# Patient Record
Sex: Female | Born: 1940 | Race: Black or African American | Hispanic: No | Smoking: Former smoker
Health system: Southern US, Community
[De-identification: ages and names within clinical notes are randomized; demographics above are authoritative.]

## PROBLEM LIST (undated history)

## (undated) DIAGNOSIS — R102 Pelvic and perineal pain: Secondary | ICD-10-CM

## (undated) DIAGNOSIS — Z22322 Carrier or suspected carrier of Methicillin resistant Staphylococcus aureus: Secondary | ICD-10-CM

## (undated) DIAGNOSIS — E669 Obesity, unspecified: Secondary | ICD-10-CM

## (undated) DIAGNOSIS — E119 Type 2 diabetes mellitus without complications: Secondary | ICD-10-CM

## (undated) DIAGNOSIS — D279 Benign neoplasm of unspecified ovary: Secondary | ICD-10-CM

## (undated) DIAGNOSIS — M1990 Unspecified osteoarthritis, unspecified site: Secondary | ICD-10-CM

## (undated) DIAGNOSIS — H269 Unspecified cataract: Secondary | ICD-10-CM

## (undated) DIAGNOSIS — I252 Old myocardial infarction: Secondary | ICD-10-CM

## (undated) DIAGNOSIS — R011 Cardiac murmur, unspecified: Secondary | ICD-10-CM

## (undated) DIAGNOSIS — H40009 Preglaucoma, unspecified, unspecified eye: Secondary | ICD-10-CM

## (undated) DIAGNOSIS — K219 Gastro-esophageal reflux disease without esophagitis: Secondary | ICD-10-CM

## (undated) DIAGNOSIS — G4730 Sleep apnea, unspecified: Secondary | ICD-10-CM

## (undated) DIAGNOSIS — I499 Cardiac arrhythmia, unspecified: Secondary | ICD-10-CM

## (undated) DIAGNOSIS — D229 Melanocytic nevi, unspecified: Secondary | ICD-10-CM

## (undated) DIAGNOSIS — I10 Essential (primary) hypertension: Secondary | ICD-10-CM

## (undated) HISTORY — DX: Old myocardial infarction: I25.2

## (undated) HISTORY — DX: Essential (primary) hypertension: I10

## (undated) HISTORY — DX: Unspecified osteoarthritis, unspecified site: M19.90

## (undated) HISTORY — DX: Cardiac arrhythmia, unspecified: I49.9

## (undated) HISTORY — DX: Unspecified cataract: H26.9

## (undated) HISTORY — DX: Cardiac murmur, unspecified: R01.1

## (undated) HISTORY — DX: Benign neoplasm of unspecified ovary: D27.9

## (undated) HISTORY — PX: DILATION AND CURETTAGE OF UTERUS: SHX78

## (undated) HISTORY — DX: Carrier or suspected carrier of methicillin resistant Staphylococcus aureus: Z22.322

## (undated) HISTORY — DX: Obesity, unspecified: E66.9

## (undated) HISTORY — DX: Sleep apnea, unspecified: G47.30

## (undated) HISTORY — DX: Pelvic and perineal pain: R10.2

## (undated) HISTORY — PX: BREAST BIOPSY: SHX20

## (undated) HISTORY — DX: Type 2 diabetes mellitus without complications: E11.9

## (undated) HISTORY — PX: OOPHORECTOMY: SHX86

## (undated) HISTORY — DX: Gastro-esophageal reflux disease without esophagitis: K21.9

## (undated) HISTORY — PX: LAPAROSCOPY: SHX197

## (undated) HISTORY — PX: KNEE ARTHROSCOPY: SUR90

## (undated) HISTORY — DX: Preglaucoma, unspecified, unspecified eye: H40.009

## (undated) HISTORY — DX: Melanocytic nevi, unspecified: D22.9

---

## 1980-06-30 HISTORY — PX: VAGINAL HYSTERECTOMY: SUR661

## 2000-10-16 ENCOUNTER — Encounter: Admission: RE | Admit: 2000-10-16 | Discharge: 2000-10-16 | Payer: Self-pay

## 2001-01-14 ENCOUNTER — Encounter: Admission: RE | Admit: 2001-01-14 | Discharge: 2001-01-14 | Payer: Self-pay | Admitting: Internal Medicine

## 2001-03-11 ENCOUNTER — Encounter: Admission: RE | Admit: 2001-03-11 | Discharge: 2001-03-11 | Payer: Self-pay | Admitting: Internal Medicine

## 2001-05-10 ENCOUNTER — Encounter: Admission: RE | Admit: 2001-05-10 | Discharge: 2001-05-10 | Payer: Self-pay | Admitting: Internal Medicine

## 2001-05-26 ENCOUNTER — Encounter: Admission: RE | Admit: 2001-05-26 | Discharge: 2001-05-26 | Payer: Self-pay

## 2001-08-09 ENCOUNTER — Encounter: Admission: RE | Admit: 2001-08-09 | Discharge: 2001-08-09 | Payer: Self-pay | Admitting: Internal Medicine

## 2002-03-02 ENCOUNTER — Encounter: Admission: RE | Admit: 2002-03-02 | Discharge: 2002-05-31 | Payer: Self-pay | Admitting: Unknown Physician Specialty

## 2002-03-31 ENCOUNTER — Encounter: Payer: Self-pay | Admitting: Family Medicine

## 2002-03-31 ENCOUNTER — Encounter: Admission: RE | Admit: 2002-03-31 | Discharge: 2002-03-31 | Payer: Self-pay | Admitting: Family Medicine

## 2003-03-24 ENCOUNTER — Encounter: Admission: RE | Admit: 2003-03-24 | Discharge: 2003-03-24 | Payer: Self-pay | Admitting: Internal Medicine

## 2003-05-01 ENCOUNTER — Encounter: Admission: RE | Admit: 2003-05-01 | Discharge: 2003-05-01 | Payer: Self-pay | Admitting: Internal Medicine

## 2003-08-01 ENCOUNTER — Encounter: Admission: RE | Admit: 2003-08-01 | Discharge: 2003-08-01 | Payer: Self-pay | Admitting: Internal Medicine

## 2003-08-24 ENCOUNTER — Encounter: Admission: RE | Admit: 2003-08-24 | Discharge: 2003-08-24 | Payer: Self-pay | Admitting: Internal Medicine

## 2003-08-25 ENCOUNTER — Encounter: Admission: RE | Admit: 2003-08-25 | Discharge: 2003-08-25 | Payer: Self-pay | Admitting: Internal Medicine

## 2003-09-11 ENCOUNTER — Ambulatory Visit (HOSPITAL_COMMUNITY): Admission: RE | Admit: 2003-09-11 | Discharge: 2003-09-11 | Payer: Self-pay | Admitting: Internal Medicine

## 2003-09-11 ENCOUNTER — Encounter: Admission: RE | Admit: 2003-09-11 | Discharge: 2003-09-11 | Payer: Self-pay | Admitting: Internal Medicine

## 2003-11-07 ENCOUNTER — Encounter: Admission: RE | Admit: 2003-11-07 | Discharge: 2003-11-07 | Payer: Self-pay | Admitting: Internal Medicine

## 2003-11-12 ENCOUNTER — Emergency Department (HOSPITAL_COMMUNITY): Admission: EM | Admit: 2003-11-12 | Discharge: 2003-11-12 | Payer: Self-pay | Admitting: Family Medicine

## 2004-01-26 ENCOUNTER — Emergency Department (HOSPITAL_COMMUNITY): Admission: EM | Admit: 2004-01-26 | Discharge: 2004-01-26 | Payer: Self-pay | Admitting: Family Medicine

## 2004-04-17 ENCOUNTER — Ambulatory Visit: Payer: Self-pay | Admitting: Internal Medicine

## 2004-04-24 ENCOUNTER — Ambulatory Visit: Payer: Self-pay | Admitting: Internal Medicine

## 2004-04-29 ENCOUNTER — Emergency Department (HOSPITAL_COMMUNITY): Admission: EM | Admit: 2004-04-29 | Discharge: 2004-04-29 | Payer: Self-pay | Admitting: Family Medicine

## 2004-05-12 ENCOUNTER — Emergency Department (HOSPITAL_COMMUNITY): Admission: EM | Admit: 2004-05-12 | Discharge: 2004-05-12 | Payer: Self-pay | Admitting: Family Medicine

## 2004-05-15 ENCOUNTER — Emergency Department (HOSPITAL_COMMUNITY): Admission: EM | Admit: 2004-05-15 | Discharge: 2004-05-15 | Payer: Self-pay | Admitting: Family Medicine

## 2004-05-17 ENCOUNTER — Emergency Department (HOSPITAL_COMMUNITY): Admission: EM | Admit: 2004-05-17 | Discharge: 2004-05-17 | Payer: Self-pay | Admitting: Family Medicine

## 2004-06-20 ENCOUNTER — Ambulatory Visit: Payer: Self-pay | Admitting: Internal Medicine

## 2004-07-02 ENCOUNTER — Ambulatory Visit: Payer: Self-pay | Admitting: Internal Medicine

## 2004-10-08 ENCOUNTER — Emergency Department (HOSPITAL_COMMUNITY): Admission: EM | Admit: 2004-10-08 | Discharge: 2004-10-08 | Payer: Self-pay | Admitting: Family Medicine

## 2004-10-15 ENCOUNTER — Emergency Department (HOSPITAL_COMMUNITY): Admission: EM | Admit: 2004-10-15 | Discharge: 2004-10-15 | Payer: Self-pay | Admitting: Family Medicine

## 2004-11-04 ENCOUNTER — Ambulatory Visit: Payer: Self-pay | Admitting: Internal Medicine

## 2004-11-12 ENCOUNTER — Ambulatory Visit (HOSPITAL_COMMUNITY): Admission: RE | Admit: 2004-11-12 | Discharge: 2004-11-12 | Payer: Self-pay | Admitting: Internal Medicine

## 2004-12-15 ENCOUNTER — Ambulatory Visit (HOSPITAL_BASED_OUTPATIENT_CLINIC_OR_DEPARTMENT_OTHER): Admission: RE | Admit: 2004-12-15 | Discharge: 2004-12-15 | Payer: Self-pay | Admitting: Internal Medicine

## 2004-12-15 ENCOUNTER — Ambulatory Visit: Payer: Self-pay | Admitting: Internal Medicine

## 2004-12-16 ENCOUNTER — Ambulatory Visit: Payer: Self-pay | Admitting: Internal Medicine

## 2004-12-30 ENCOUNTER — Ambulatory Visit: Payer: Self-pay | Admitting: Internal Medicine

## 2005-01-09 ENCOUNTER — Ambulatory Visit: Payer: Self-pay | Admitting: Internal Medicine

## 2005-01-27 ENCOUNTER — Ambulatory Visit: Payer: Self-pay | Admitting: Internal Medicine

## 2005-05-15 ENCOUNTER — Ambulatory Visit: Payer: Self-pay | Admitting: Internal Medicine

## 2005-07-02 ENCOUNTER — Ambulatory Visit: Payer: Self-pay | Admitting: Internal Medicine

## 2005-08-12 ENCOUNTER — Encounter: Admission: RE | Admit: 2005-08-12 | Discharge: 2005-08-12 | Payer: Self-pay | Admitting: Internal Medicine

## 2005-09-07 ENCOUNTER — Emergency Department (HOSPITAL_COMMUNITY): Admission: EM | Admit: 2005-09-07 | Discharge: 2005-09-07 | Payer: Self-pay | Admitting: Family Medicine

## 2005-10-01 ENCOUNTER — Ambulatory Visit: Payer: Self-pay | Admitting: Internal Medicine

## 2005-10-29 ENCOUNTER — Emergency Department (HOSPITAL_COMMUNITY): Admission: EM | Admit: 2005-10-29 | Discharge: 2005-10-29 | Payer: Self-pay | Admitting: Family Medicine

## 2005-11-15 ENCOUNTER — Emergency Department (HOSPITAL_COMMUNITY): Admission: EM | Admit: 2005-11-15 | Discharge: 2005-11-15 | Payer: Self-pay | Admitting: Family Medicine

## 2006-01-02 ENCOUNTER — Ambulatory Visit: Payer: Self-pay | Admitting: Internal Medicine

## 2006-02-23 ENCOUNTER — Ambulatory Visit: Payer: Self-pay | Admitting: Hospitalist

## 2006-02-25 ENCOUNTER — Ambulatory Visit (HOSPITAL_COMMUNITY): Admission: RE | Admit: 2006-02-25 | Discharge: 2006-02-25 | Payer: Self-pay | Admitting: Gastroenterology

## 2006-02-25 LAB — HM COLONOSCOPY

## 2006-03-12 ENCOUNTER — Encounter (INDEPENDENT_AMBULATORY_CARE_PROVIDER_SITE_OTHER): Payer: Self-pay | Admitting: Cardiology

## 2006-03-12 ENCOUNTER — Ambulatory Visit (HOSPITAL_COMMUNITY): Admission: RE | Admit: 2006-03-12 | Discharge: 2006-03-12 | Payer: Self-pay | Admitting: Hospitalist

## 2006-03-20 ENCOUNTER — Ambulatory Visit: Payer: Self-pay | Admitting: Hospitalist

## 2006-04-01 ENCOUNTER — Ambulatory Visit: Payer: Self-pay | Admitting: Internal Medicine

## 2006-04-22 DIAGNOSIS — G4733 Obstructive sleep apnea (adult) (pediatric): Secondary | ICD-10-CM

## 2006-04-22 DIAGNOSIS — E119 Type 2 diabetes mellitus without complications: Secondary | ICD-10-CM | POA: Insufficient documentation

## 2006-04-22 DIAGNOSIS — E785 Hyperlipidemia, unspecified: Secondary | ICD-10-CM

## 2006-04-22 DIAGNOSIS — R011 Cardiac murmur, unspecified: Secondary | ICD-10-CM

## 2006-04-22 DIAGNOSIS — I10 Essential (primary) hypertension: Secondary | ICD-10-CM | POA: Insufficient documentation

## 2006-04-22 DIAGNOSIS — E669 Obesity, unspecified: Secondary | ICD-10-CM

## 2006-06-08 ENCOUNTER — Ambulatory Visit: Payer: Self-pay | Admitting: Internal Medicine

## 2006-07-06 ENCOUNTER — Emergency Department (HOSPITAL_COMMUNITY): Admission: EM | Admit: 2006-07-06 | Discharge: 2006-07-06 | Payer: Self-pay | Admitting: Family Medicine

## 2006-07-21 DIAGNOSIS — B957 Other staphylococcus as the cause of diseases classified elsewhere: Secondary | ICD-10-CM

## 2006-07-21 DIAGNOSIS — K649 Unspecified hemorrhoids: Secondary | ICD-10-CM | POA: Insufficient documentation

## 2006-07-21 DIAGNOSIS — Z9079 Acquired absence of other genital organ(s): Secondary | ICD-10-CM | POA: Insufficient documentation

## 2006-07-23 ENCOUNTER — Ambulatory Visit (HOSPITAL_COMMUNITY): Admission: RE | Admit: 2006-07-23 | Discharge: 2006-07-23 | Payer: Self-pay | Admitting: Internal Medicine

## 2006-07-24 ENCOUNTER — Encounter (INDEPENDENT_AMBULATORY_CARE_PROVIDER_SITE_OTHER): Payer: Self-pay | Admitting: Internal Medicine

## 2006-08-14 ENCOUNTER — Encounter: Admission: RE | Admit: 2006-08-14 | Discharge: 2006-08-14 | Payer: Self-pay | Admitting: Sports Medicine

## 2006-10-16 ENCOUNTER — Encounter (INDEPENDENT_AMBULATORY_CARE_PROVIDER_SITE_OTHER): Payer: Self-pay | Admitting: Unknown Physician Specialty

## 2006-10-29 ENCOUNTER — Ambulatory Visit: Payer: Self-pay | Admitting: Internal Medicine

## 2006-10-29 ENCOUNTER — Encounter (INDEPENDENT_AMBULATORY_CARE_PROVIDER_SITE_OTHER): Payer: Self-pay | Admitting: Unknown Physician Specialty

## 2006-10-29 DIAGNOSIS — H9190 Unspecified hearing loss, unspecified ear: Secondary | ICD-10-CM | POA: Insufficient documentation

## 2006-10-29 LAB — CONVERTED CEMR LAB
ALT: 13 units/L (ref 0–35)
AST: 13 units/L (ref 0–37)
Albumin: 4.2 g/dL (ref 3.5–5.2)
Alkaline Phosphatase: 74 units/L (ref 39–117)
BUN: 12 mg/dL (ref 6–23)
Blood Glucose, Fingerstick: 180
CO2: 26 meq/L (ref 19–32)
Calcium: 9.7 mg/dL (ref 8.4–10.5)
Chloride: 100 meq/L (ref 96–112)
Creatinine, Ser: 0.62 mg/dL (ref 0.40–1.20)
Glucose, Bld: 153 mg/dL — ABNORMAL HIGH (ref 70–99)
HCT: 41 % (ref 36.0–46.0)
Hemoglobin: 13.7 g/dL (ref 12.0–15.0)
Hgb A1c MFr Bld: 6.3 %
MCHC: 33.4 g/dL (ref 30.0–36.0)
MCV: 86.5 fL (ref 78.0–100.0)
Platelets: 272 10*3/uL (ref 150–400)
Potassium: 4.2 meq/L (ref 3.5–5.3)
RBC: 4.74 M/uL (ref 3.87–5.11)
RDW: 12.8 % (ref 11.5–14.0)
Sodium: 137 meq/L (ref 135–145)
Total Bilirubin: 0.9 mg/dL (ref 0.3–1.2)
Total Protein: 7.3 g/dL (ref 6.0–8.3)
WBC: 7.8 10*3/uL (ref 4.0–10.5)

## 2006-11-02 DIAGNOSIS — K219 Gastro-esophageal reflux disease without esophagitis: Secondary | ICD-10-CM

## 2006-12-03 ENCOUNTER — Ambulatory Visit: Payer: Self-pay | Admitting: Internal Medicine

## 2006-12-03 ENCOUNTER — Encounter (INDEPENDENT_AMBULATORY_CARE_PROVIDER_SITE_OTHER): Payer: Self-pay | Admitting: Unknown Physician Specialty

## 2006-12-03 LAB — CONVERTED CEMR LAB: Blood Glucose, Fingerstick: 159

## 2006-12-10 ENCOUNTER — Ambulatory Visit (HOSPITAL_COMMUNITY): Admission: RE | Admit: 2006-12-10 | Discharge: 2006-12-10 | Payer: Self-pay | Admitting: Internal Medicine

## 2006-12-18 ENCOUNTER — Ambulatory Visit: Payer: Self-pay | Admitting: Internal Medicine

## 2006-12-18 LAB — CONVERTED CEMR LAB: Blood Glucose, Fingerstick: 155

## 2006-12-31 ENCOUNTER — Encounter: Payer: Self-pay | Admitting: Internal Medicine

## 2007-02-16 ENCOUNTER — Encounter: Payer: Self-pay | Admitting: Internal Medicine

## 2007-03-28 ENCOUNTER — Emergency Department (HOSPITAL_COMMUNITY): Admission: EM | Admit: 2007-03-28 | Discharge: 2007-03-28 | Payer: Self-pay | Admitting: Family Medicine

## 2007-03-29 ENCOUNTER — Encounter (INDEPENDENT_AMBULATORY_CARE_PROVIDER_SITE_OTHER): Payer: Self-pay | Admitting: Internal Medicine

## 2007-03-29 ENCOUNTER — Ambulatory Visit: Payer: Self-pay | Admitting: Internal Medicine

## 2007-03-29 DIAGNOSIS — L299 Pruritus, unspecified: Secondary | ICD-10-CM | POA: Insufficient documentation

## 2007-03-29 DIAGNOSIS — R197 Diarrhea, unspecified: Secondary | ICD-10-CM

## 2007-03-29 LAB — CONVERTED CEMR LAB
Blood Glucose, Fingerstick: 126
Hgb A1c MFr Bld: 6.8 %

## 2007-03-31 LAB — CONVERTED CEMR LAB
ALT: 12 units/L (ref 0–35)
AST: 13 units/L (ref 0–37)
Albumin: 4.4 g/dL (ref 3.5–5.2)
Alkaline Phosphatase: 71 units/L (ref 39–117)
BUN: 7 mg/dL (ref 6–23)
Basophils Absolute: 0 10*3/uL (ref 0.0–0.1)
Basophils Relative: 0 % (ref 0–1)
CO2: 23 meq/L (ref 19–32)
Calcium: 9.3 mg/dL (ref 8.4–10.5)
Chloride: 104 meq/L (ref 96–112)
Creatinine, Ser: 0.59 mg/dL (ref 0.40–1.20)
Eosinophils Absolute: 0.1 10*3/uL (ref 0.0–0.7)
Eosinophils Relative: 1 % (ref 0–5)
Glucose, Bld: 106 mg/dL — ABNORMAL HIGH (ref 70–99)
HCT: 40.5 % (ref 36.0–46.0)
Hemoglobin: 13.5 g/dL (ref 12.0–15.0)
Lymphocytes Relative: 37 % (ref 12–46)
Lymphs Abs: 1.8 10*3/uL (ref 0.7–3.3)
MCHC: 33.3 g/dL (ref 30.0–36.0)
MCV: 89.2 fL (ref 78.0–100.0)
Monocytes Absolute: 0.3 10*3/uL (ref 0.2–0.7)
Monocytes Relative: 5 % (ref 3–11)
Neutro Abs: 2.8 10*3/uL (ref 1.7–7.7)
Neutrophils Relative %: 57 % (ref 43–77)
Platelets: 281 10*3/uL (ref 150–400)
Potassium: 3.9 meq/L (ref 3.5–5.3)
RBC: 4.54 M/uL (ref 3.87–5.11)
RDW: 12.9 % (ref 11.5–14.0)
Sodium: 140 meq/L (ref 135–145)
TSH: 0.575 microintl units/mL (ref 0.350–5.50)
Tissue Transglutaminase Ab, IgA: 1.3 units (ref <7)
Total Bilirubin: 0.7 mg/dL (ref 0.3–1.2)
Total Protein: 7.3 g/dL (ref 6.0–8.3)
WBC: 5 10*3/uL (ref 4.0–10.5)

## 2007-04-05 ENCOUNTER — Emergency Department (HOSPITAL_COMMUNITY): Admission: EM | Admit: 2007-04-05 | Discharge: 2007-04-05 | Payer: Self-pay | Admitting: Emergency Medicine

## 2007-04-08 ENCOUNTER — Emergency Department (HOSPITAL_COMMUNITY): Admission: EM | Admit: 2007-04-08 | Discharge: 2007-04-08 | Payer: Self-pay | Admitting: Emergency Medicine

## 2007-05-05 ENCOUNTER — Encounter (INDEPENDENT_AMBULATORY_CARE_PROVIDER_SITE_OTHER): Payer: Self-pay | Admitting: Internal Medicine

## 2007-05-05 ENCOUNTER — Ambulatory Visit: Payer: Self-pay | Admitting: *Deleted

## 2007-05-05 DIAGNOSIS — M79609 Pain in unspecified limb: Secondary | ICD-10-CM | POA: Insufficient documentation

## 2007-05-05 LAB — CONVERTED CEMR LAB
BUN: 10 mg/dL (ref 6–23)
CO2: 24 meq/L (ref 19–32)
Calcium: 9.6 mg/dL (ref 8.4–10.5)
Chloride: 102 meq/L (ref 96–112)
Creatinine, Ser: 0.68 mg/dL (ref 0.40–1.20)
Glucose, Bld: 167 mg/dL — ABNORMAL HIGH (ref 70–99)
Potassium: 4.2 meq/L (ref 3.5–5.3)
Sodium: 139 meq/L (ref 135–145)

## 2007-07-22 ENCOUNTER — Ambulatory Visit: Admission: RE | Admit: 2007-07-22 | Discharge: 2007-07-22 | Payer: Self-pay | Admitting: Internal Medicine

## 2007-07-22 ENCOUNTER — Encounter: Payer: Self-pay | Admitting: Internal Medicine

## 2007-08-16 ENCOUNTER — Encounter: Admission: RE | Admit: 2007-08-16 | Discharge: 2007-08-16 | Payer: Self-pay | Admitting: Family Medicine

## 2007-08-19 ENCOUNTER — Encounter: Payer: Self-pay | Admitting: Internal Medicine

## 2007-08-24 ENCOUNTER — Ambulatory Visit: Payer: Self-pay | Admitting: Internal Medicine

## 2007-08-24 DIAGNOSIS — M109 Gout, unspecified: Secondary | ICD-10-CM

## 2007-08-24 LAB — CONVERTED CEMR LAB
Blood Glucose, Fingerstick: 169
Hgb A1c MFr Bld: 7.4 %

## 2007-09-01 ENCOUNTER — Telehealth (INDEPENDENT_AMBULATORY_CARE_PROVIDER_SITE_OTHER): Payer: Self-pay | Admitting: *Deleted

## 2007-09-03 ENCOUNTER — Ambulatory Visit: Payer: Self-pay | Admitting: Hospitalist

## 2007-09-03 ENCOUNTER — Encounter: Payer: Self-pay | Admitting: Internal Medicine

## 2007-09-03 LAB — CONVERTED CEMR LAB: Blood Glucose, Home Monitor: 1 mg/dL

## 2007-09-08 ENCOUNTER — Encounter: Payer: Self-pay | Admitting: Internal Medicine

## 2007-10-03 DIAGNOSIS — H40059 Ocular hypertension, unspecified eye: Secondary | ICD-10-CM

## 2007-10-19 ENCOUNTER — Encounter: Payer: Self-pay | Admitting: Internal Medicine

## 2007-10-19 LAB — CONVERTED CEMR LAB
Cholesterol: 208 mg/dL
Creatinine, Ser: 0.52 mg/dL
HDL: 63 mg/dL
Hgb A1c MFr Bld: 8 %
LDL Cholesterol: 126 mg/dL
Triglycerides: 94 mg/dL

## 2007-10-21 ENCOUNTER — Encounter: Payer: Self-pay | Admitting: Internal Medicine

## 2007-11-01 ENCOUNTER — Ambulatory Visit: Payer: Self-pay | Admitting: Internal Medicine

## 2007-11-01 DIAGNOSIS — D239 Other benign neoplasm of skin, unspecified: Secondary | ICD-10-CM | POA: Insufficient documentation

## 2007-12-02 ENCOUNTER — Encounter: Payer: Self-pay | Admitting: Internal Medicine

## 2007-12-02 ENCOUNTER — Ambulatory Visit: Payer: Self-pay | Admitting: *Deleted

## 2007-12-02 LAB — CONVERTED CEMR LAB

## 2008-01-14 ENCOUNTER — Encounter: Payer: Self-pay | Admitting: Internal Medicine

## 2008-01-17 ENCOUNTER — Telehealth: Payer: Self-pay | Admitting: Internal Medicine

## 2008-02-17 ENCOUNTER — Encounter: Payer: Self-pay | Admitting: Internal Medicine

## 2008-03-27 ENCOUNTER — Telehealth: Payer: Self-pay | Admitting: Internal Medicine

## 2008-04-19 ENCOUNTER — Telehealth: Payer: Self-pay | Admitting: Internal Medicine

## 2008-04-24 ENCOUNTER — Telehealth (INDEPENDENT_AMBULATORY_CARE_PROVIDER_SITE_OTHER): Payer: Self-pay | Admitting: *Deleted

## 2008-05-01 ENCOUNTER — Ambulatory Visit: Payer: Self-pay | Admitting: Infectious Diseases

## 2008-05-01 ENCOUNTER — Ambulatory Visit: Payer: Self-pay | Admitting: *Deleted

## 2008-05-01 ENCOUNTER — Encounter: Payer: Self-pay | Admitting: Internal Medicine

## 2008-05-01 LAB — CONVERTED CEMR LAB
ALT: 12 units/L (ref 0–35)
AST: 11 units/L (ref 0–37)
Albumin: 4.2 g/dL (ref 3.5–5.2)
Alkaline Phosphatase: 74 units/L (ref 39–117)
BUN: 12 mg/dL (ref 6–23)
Basophils Absolute: 0 10*3/uL (ref 0.0–0.1)
Basophils Relative: 0 % (ref 0–1)
Blood Glucose, Home Monitor: 1 mg/dL
CO2: 24 meq/L (ref 19–32)
Calcium: 9.3 mg/dL (ref 8.4–10.5)
Chloride: 100 meq/L (ref 96–112)
Cholesterol: 186 mg/dL (ref 0–200)
Creatinine, Ser: 0.64 mg/dL (ref 0.40–1.20)
Creatinine, Urine: 121.6 mg/dL
Eosinophils Absolute: 0.2 10*3/uL (ref 0.0–0.7)
Eosinophils Relative: 2 % (ref 0–5)
Glucose, Bld: 244 mg/dL — ABNORMAL HIGH (ref 70–99)
HCT: 39.1 % (ref 36.0–46.0)
HDL: 59 mg/dL (ref >39)
Hemoglobin: 13.1 g/dL (ref 12.0–15.0)
Hgb A1c MFr Bld: 8.5 %
LDL Cholesterol: 105 mg/dL — ABNORMAL HIGH (ref 0–99)
Lymphocytes Relative: 35 % (ref 12–46)
Lymphs Abs: 3.1 10*3/uL (ref 0.7–4.0)
MCHC: 33.5 g/dL (ref 30.0–36.0)
MCV: 86.9 fL (ref 78.0–100.0)
Microalb Creat Ratio: 4.4 mg/g (ref 0.0–30.0)
Microalb, Ur: 0.53 mg/dL (ref 0.00–1.89)
Monocytes Absolute: 0.5 10*3/uL (ref 0.1–1.0)
Monocytes Relative: 6 % (ref 3–12)
Neutro Abs: 5 10*3/uL (ref 1.7–7.7)
Neutrophils Relative %: 57 % (ref 43–77)
Platelets: 281 10*3/uL (ref 150–400)
Potassium: 4.3 meq/L (ref 3.5–5.3)
RBC: 4.5 M/uL (ref 3.87–5.11)
RDW: 12.5 % (ref 11.5–15.5)
Sodium: 138 meq/L (ref 135–145)
TSH: 0.635 microintl units/mL (ref 0.350–4.50)
Total Bilirubin: 0.5 mg/dL (ref 0.3–1.2)
Total CHOL/HDL Ratio: 3.2
Total Protein: 7.1 g/dL (ref 6.0–8.3)
Triglycerides: 111 mg/dL (ref <150)
VLDL: 22 mg/dL (ref 0–40)
WBC: 8.7 10*3/uL (ref 4.0–10.5)

## 2008-05-03 ENCOUNTER — Encounter: Payer: Self-pay | Admitting: Internal Medicine

## 2008-05-12 ENCOUNTER — Telehealth: Payer: Self-pay | Admitting: Internal Medicine

## 2008-07-04 ENCOUNTER — Encounter: Payer: Self-pay | Admitting: Internal Medicine

## 2008-07-11 ENCOUNTER — Ambulatory Visit: Payer: Self-pay | Admitting: Infectious Disease

## 2008-07-11 ENCOUNTER — Encounter: Payer: Self-pay | Admitting: Internal Medicine

## 2008-07-11 LAB — CONVERTED CEMR LAB
ALT: 13 units/L (ref 0–35)
AST: 10 units/L (ref 0–37)
Albumin: 4.1 g/dL (ref 3.5–5.2)
Alkaline Phosphatase: 67 units/L (ref 39–117)
BUN: 10 mg/dL (ref 6–23)
Blood Glucose, Fingerstick: 208
CO2: 22 meq/L (ref 19–32)
Calcium: 9.2 mg/dL (ref 8.4–10.5)
Chloride: 107 meq/L (ref 96–112)
Creatinine, Ser: 0.63 mg/dL (ref 0.40–1.20)
Glucose, Bld: 195 mg/dL — ABNORMAL HIGH (ref 70–99)
Hgb A1c MFr Bld: 8.5 %
Potassium: 4.3 meq/L (ref 3.5–5.3)
Sodium: 142 meq/L (ref 135–145)
Total Bilirubin: 1 mg/dL (ref 0.3–1.2)
Total Protein: 6.9 g/dL (ref 6.0–8.3)
Uric Acid, Serum: 8.1 mg/dL — ABNORMAL HIGH (ref 2.4–7.0)

## 2008-07-27 ENCOUNTER — Encounter: Payer: Self-pay | Admitting: Internal Medicine

## 2008-07-27 ENCOUNTER — Telehealth: Payer: Self-pay | Admitting: Internal Medicine

## 2008-07-27 ENCOUNTER — Ambulatory Visit (HOSPITAL_COMMUNITY): Admission: RE | Admit: 2008-07-27 | Discharge: 2008-07-27 | Payer: Self-pay | Admitting: Internal Medicine

## 2008-08-16 ENCOUNTER — Encounter: Payer: Self-pay | Admitting: Internal Medicine

## 2008-08-17 ENCOUNTER — Encounter: Admission: RE | Admit: 2008-08-17 | Discharge: 2008-08-17 | Payer: Self-pay | Admitting: Internal Medicine

## 2008-08-24 ENCOUNTER — Ambulatory Visit: Payer: Self-pay | Admitting: Internal Medicine

## 2008-08-24 DIAGNOSIS — R109 Unspecified abdominal pain: Secondary | ICD-10-CM

## 2008-08-30 ENCOUNTER — Ambulatory Visit (HOSPITAL_COMMUNITY): Admission: RE | Admit: 2008-08-30 | Discharge: 2008-08-30 | Payer: Self-pay | Admitting: Internal Medicine

## 2008-08-31 ENCOUNTER — Encounter: Payer: Self-pay | Admitting: Internal Medicine

## 2008-09-05 DIAGNOSIS — N83209 Unspecified ovarian cyst, unspecified side: Secondary | ICD-10-CM

## 2008-09-06 ENCOUNTER — Encounter: Payer: Self-pay | Admitting: Internal Medicine

## 2008-09-12 ENCOUNTER — Emergency Department (HOSPITAL_COMMUNITY): Admission: EM | Admit: 2008-09-12 | Discharge: 2008-09-12 | Payer: Self-pay | Admitting: Emergency Medicine

## 2008-09-15 ENCOUNTER — Telehealth: Payer: Self-pay | Admitting: Internal Medicine

## 2008-10-23 ENCOUNTER — Telehealth (INDEPENDENT_AMBULATORY_CARE_PROVIDER_SITE_OTHER): Payer: Self-pay | Admitting: *Deleted

## 2008-11-16 ENCOUNTER — Ambulatory Visit: Payer: Self-pay | Admitting: Family Medicine

## 2008-11-17 ENCOUNTER — Encounter: Payer: Self-pay | Admitting: Family Medicine

## 2008-11-17 LAB — CONVERTED CEMR LAB: CA 125: 4.5 units/mL (ref 0.0–30.2)

## 2008-11-21 ENCOUNTER — Ambulatory Visit (HOSPITAL_COMMUNITY): Admission: RE | Admit: 2008-11-21 | Discharge: 2008-11-21 | Payer: Self-pay | Admitting: Family Medicine

## 2008-12-14 ENCOUNTER — Ambulatory Visit: Payer: Self-pay | Admitting: Obstetrics & Gynecology

## 2009-02-22 ENCOUNTER — Ambulatory Visit: Payer: Self-pay | Admitting: Internal Medicine

## 2009-02-22 DIAGNOSIS — M1710 Unilateral primary osteoarthritis, unspecified knee: Secondary | ICD-10-CM

## 2009-02-22 LAB — CONVERTED CEMR LAB
Blood Glucose, Fingerstick: 185
Hgb A1c MFr Bld: 7.5 %

## 2009-03-01 ENCOUNTER — Ambulatory Visit (HOSPITAL_COMMUNITY): Admission: RE | Admit: 2009-03-01 | Discharge: 2009-03-01 | Payer: Self-pay | Admitting: Obstetrics & Gynecology

## 2009-03-05 IMAGING — US US PELVIS COMPLETE MODIFY
1 series · 14 of 25 positions shown · non-contrast
Comparison: None

CLINICAL DATA: Right pelvic pain.

TRANSABDOMINAL AND TRANSVAGINAL ULTRASOUND OF PELVIS
TECHNIQUE: Both transabdominal and transvaginal ultrasound
examinations of the pelvis were performed including evaluation of
the uterus, ovaries, adnexal regions, and pelvic cul-de-sac.

[Series 1: us pelvis complete modify · 0.25mm/px · 14 of 59 slices shown]
[im 1/59]
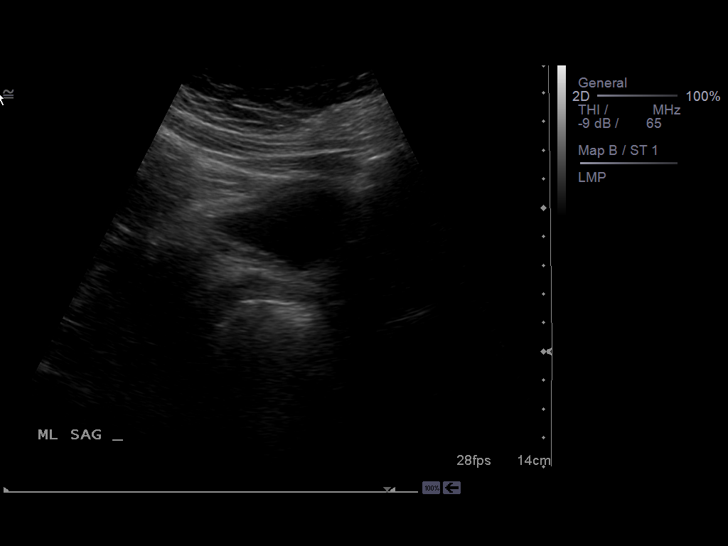
[im 5/59]
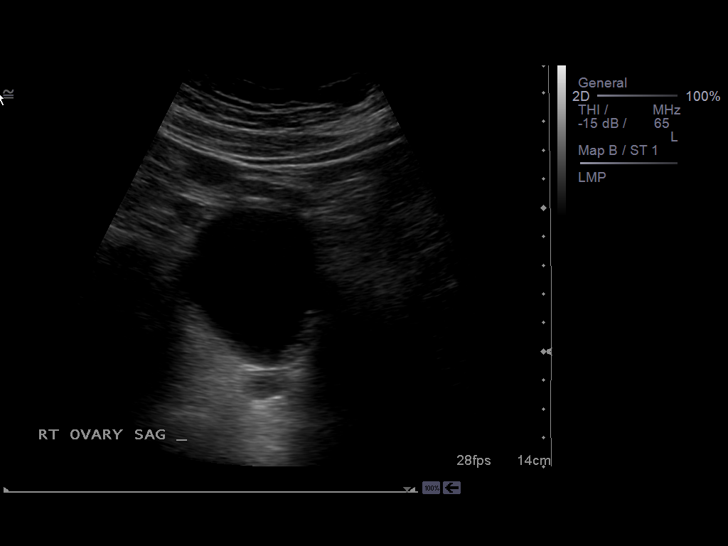
[im 10/59]
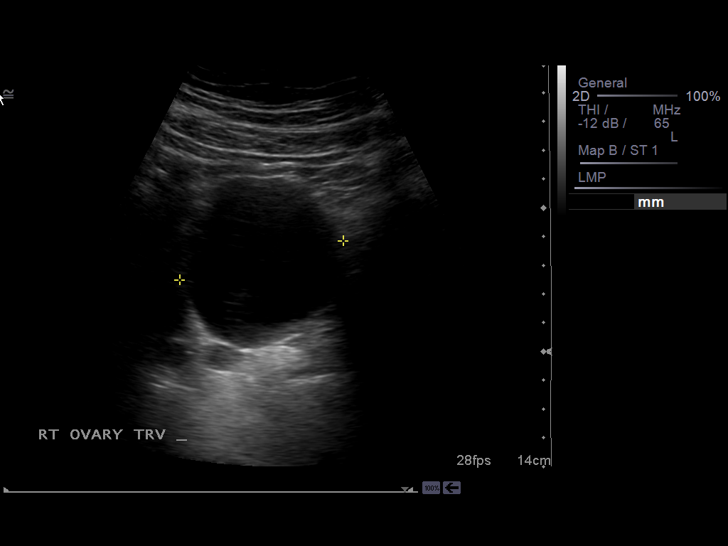
[im 15/59]
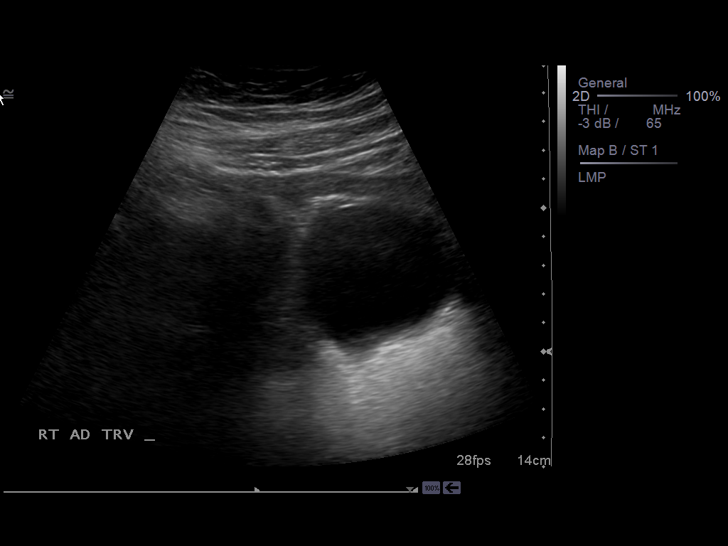
[im 20/59]
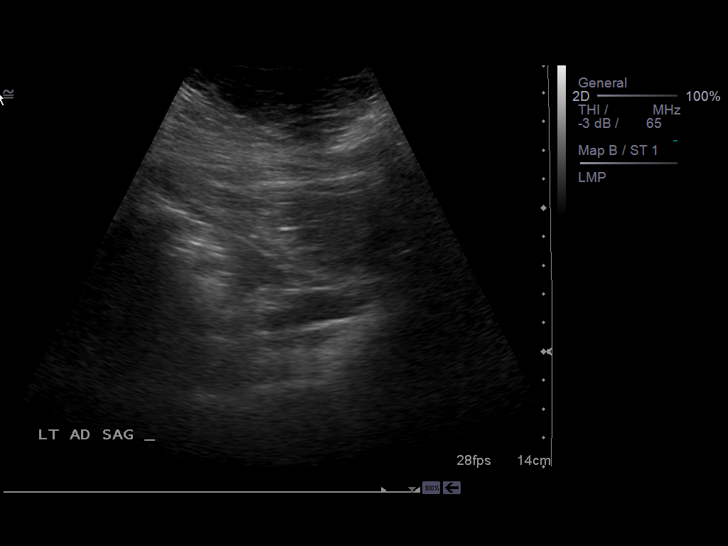
[im 22/59]
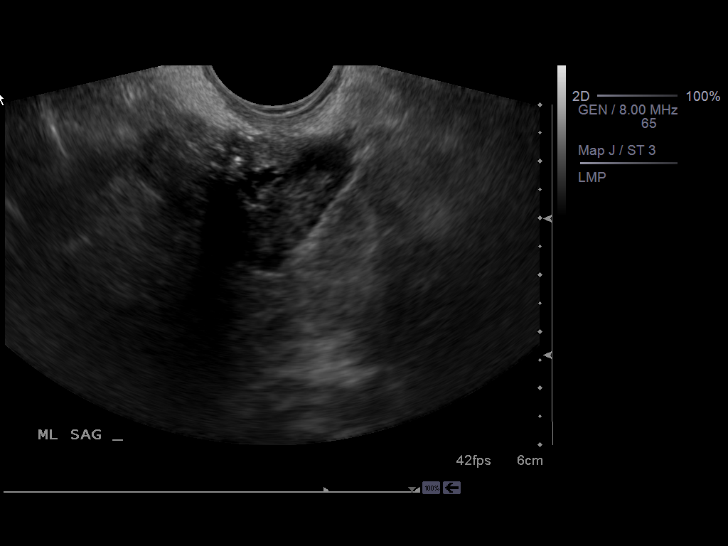
[im 27/59]
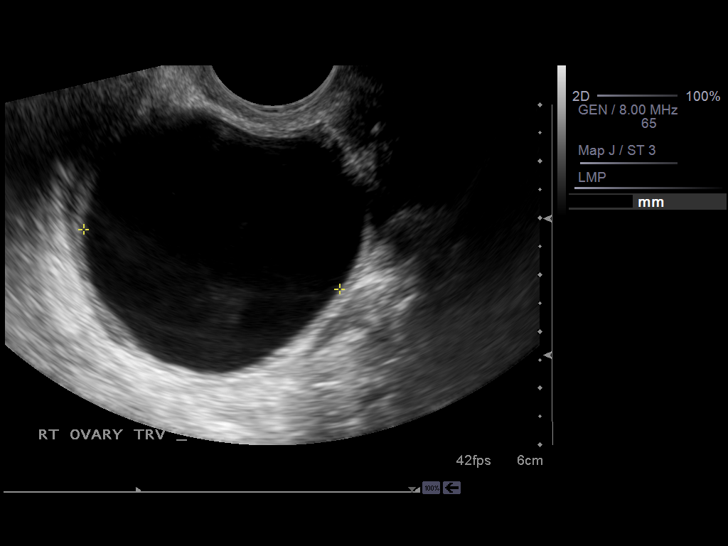
[im 32/59]
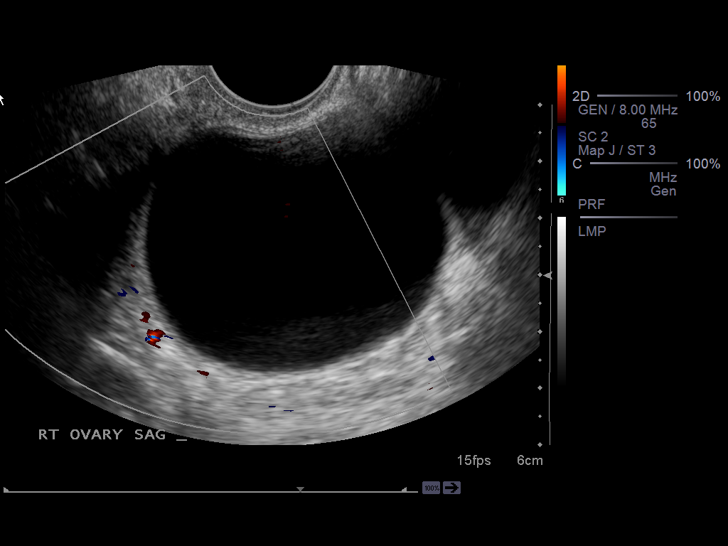
[im 37/59]
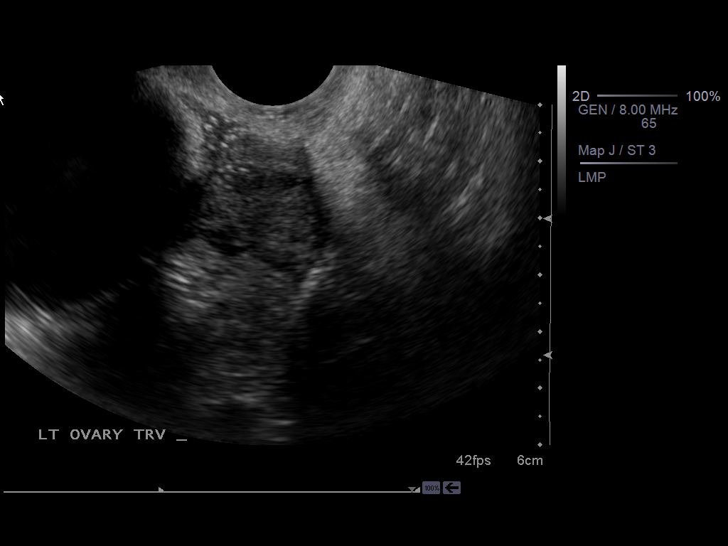
[im 39/59]
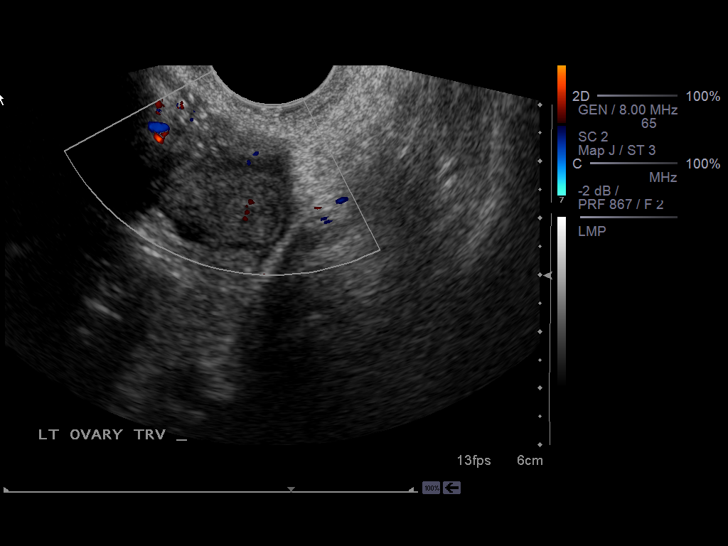
[im 44/59]
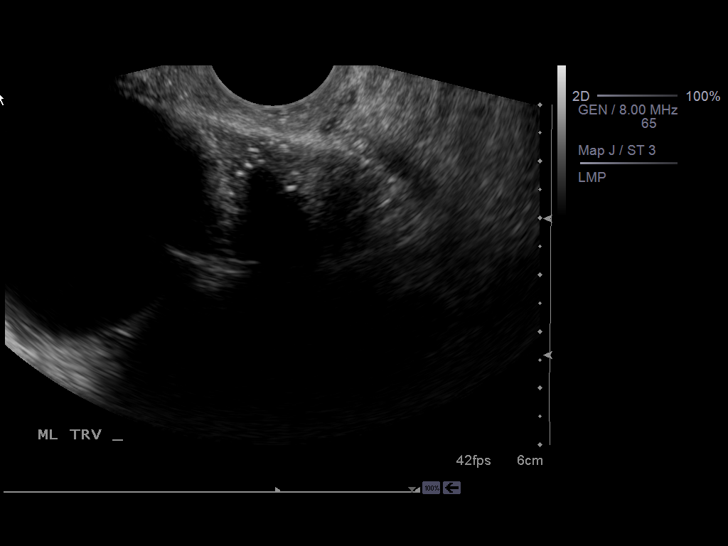
[im 49/59]
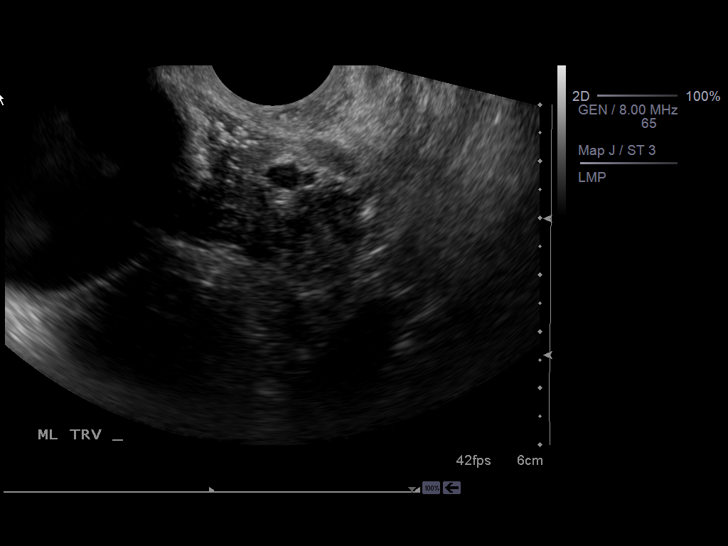
[im 54/59]
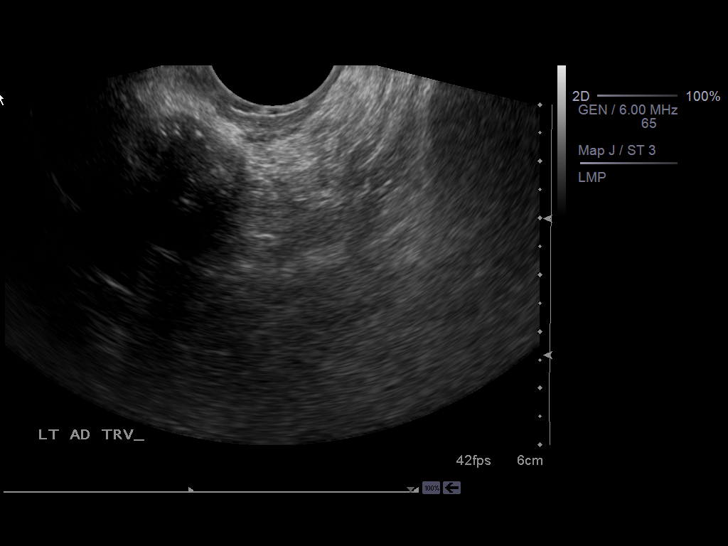
[im 59/59]
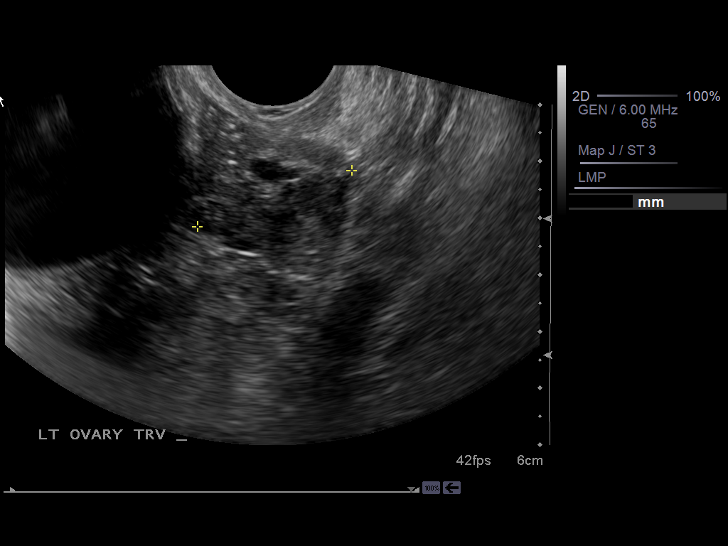

[14 of 25 positions shown; findings below may reference images not displayed]

FINDINGS: The patient is status post hysterectomy.

Within the right ovary, there is a 5.5 x 4.2 x 4.6 cm complex cyst.
There is internal debris layering within the cyst.  Slight mural
irregularity.  No septations.

Small calcifications noted within the left ovary without well-
defined mass or abnormality.  This may reflect prior inflammatory
process. Tiny collapsing cyst present within the left ovary. Left
ovary normal size, measuring 4.1 x 2.3 x 2.4 cm.
IMPRESSION: 5.5 cm complex right ovarian cyst.  Consider Ob-Gyn consultation.

Prior hysterectomy.

## 2009-03-14 ENCOUNTER — Telehealth (INDEPENDENT_AMBULATORY_CARE_PROVIDER_SITE_OTHER): Payer: Self-pay | Admitting: *Deleted

## 2009-03-19 ENCOUNTER — Telehealth (INDEPENDENT_AMBULATORY_CARE_PROVIDER_SITE_OTHER): Payer: Self-pay | Admitting: *Deleted

## 2009-03-21 ENCOUNTER — Ambulatory Visit: Payer: Self-pay | Admitting: Internal Medicine

## 2009-03-21 LAB — CONVERTED CEMR LAB
Blood Glucose, Fingerstick: 170
Cholesterol, target level: 200 mg/dL
HDL goal, serum: 40 mg/dL
LDL Goal: 100 mg/dL

## 2009-03-27 ENCOUNTER — Ambulatory Visit: Payer: Self-pay | Admitting: Family Medicine

## 2009-03-27 DIAGNOSIS — D279 Benign neoplasm of unspecified ovary: Secondary | ICD-10-CM

## 2009-03-27 HISTORY — DX: Benign neoplasm of unspecified ovary: D27.9

## 2009-03-30 ENCOUNTER — Ambulatory Visit (HOSPITAL_COMMUNITY): Admission: RE | Admit: 2009-03-30 | Discharge: 2009-03-30 | Payer: Self-pay | Admitting: Family Medicine

## 2009-04-01 ENCOUNTER — Ambulatory Visit (HOSPITAL_BASED_OUTPATIENT_CLINIC_OR_DEPARTMENT_OTHER): Admission: RE | Admit: 2009-04-01 | Discharge: 2009-04-01 | Payer: Self-pay | Admitting: Family Medicine

## 2009-04-05 ENCOUNTER — Emergency Department (HOSPITAL_COMMUNITY): Admission: EM | Admit: 2009-04-05 | Discharge: 2009-04-05 | Payer: Self-pay | Admitting: Emergency Medicine

## 2009-04-08 ENCOUNTER — Ambulatory Visit: Payer: Self-pay | Admitting: Internal Medicine

## 2009-04-16 ENCOUNTER — Ambulatory Visit: Payer: Self-pay | Admitting: Internal Medicine

## 2009-04-16 ENCOUNTER — Encounter: Payer: Self-pay | Admitting: Internal Medicine

## 2009-04-17 ENCOUNTER — Encounter: Payer: Self-pay | Admitting: Internal Medicine

## 2009-04-17 LAB — CONVERTED CEMR LAB
Cholesterol: 200 mg/dL (ref 0–200)
Creatinine, Urine: 163.3 mg/dL
HDL: 55 mg/dL (ref >39)
LDL Cholesterol: 127 mg/dL — ABNORMAL HIGH (ref 0–99)
Microalb Creat Ratio: 3.7 mg/g (ref 0.0–30.0)
Microalb, Ur: 0.6 mg/dL (ref 0.00–1.89)
Total CHOL/HDL Ratio: 3.6
Triglycerides: 92 mg/dL (ref <150)
VLDL: 18 mg/dL (ref 0–40)

## 2009-05-10 ENCOUNTER — Ambulatory Visit: Payer: Self-pay | Admitting: Family Medicine

## 2009-05-22 ENCOUNTER — Telehealth: Payer: Self-pay | Admitting: Family Medicine

## 2009-05-26 ENCOUNTER — Encounter: Payer: Self-pay | Admitting: Internal Medicine

## 2009-05-26 ENCOUNTER — Ambulatory Visit (HOSPITAL_BASED_OUTPATIENT_CLINIC_OR_DEPARTMENT_OTHER): Admission: RE | Admit: 2009-05-26 | Discharge: 2009-05-26 | Payer: Self-pay | Admitting: Family Medicine

## 2009-05-26 ENCOUNTER — Ambulatory Visit: Payer: Self-pay | Admitting: Internal Medicine

## 2009-06-13 ENCOUNTER — Emergency Department (HOSPITAL_COMMUNITY): Admission: EM | Admit: 2009-06-13 | Discharge: 2009-06-13 | Payer: Self-pay | Admitting: Emergency Medicine

## 2009-07-03 ENCOUNTER — Telehealth: Payer: Self-pay | Admitting: Family Medicine

## 2009-07-11 ENCOUNTER — Emergency Department (HOSPITAL_COMMUNITY): Admission: EM | Admit: 2009-07-11 | Discharge: 2009-07-11 | Payer: Self-pay | Admitting: Family Medicine

## 2009-07-16 ENCOUNTER — Telehealth (INDEPENDENT_AMBULATORY_CARE_PROVIDER_SITE_OTHER): Payer: Self-pay | Admitting: *Deleted

## 2009-07-24 ENCOUNTER — Encounter: Payer: Self-pay | Admitting: Family Medicine

## 2009-08-06 ENCOUNTER — Inpatient Hospital Stay (HOSPITAL_COMMUNITY): Admission: RE | Admit: 2009-08-06 | Discharge: 2009-08-08 | Payer: Self-pay | Admitting: Family Medicine

## 2009-08-06 ENCOUNTER — Encounter: Payer: Self-pay | Admitting: Family Medicine

## 2009-08-06 ENCOUNTER — Ambulatory Visit: Payer: Self-pay | Admitting: Family Medicine

## 2009-08-07 ENCOUNTER — Encounter: Payer: Self-pay | Admitting: *Deleted

## 2009-08-13 ENCOUNTER — Ambulatory Visit: Payer: Self-pay | Admitting: Obstetrics & Gynecology

## 2009-08-13 ENCOUNTER — Telehealth: Payer: Self-pay | Admitting: Internal Medicine

## 2009-08-20 ENCOUNTER — Encounter: Admission: RE | Admit: 2009-08-20 | Discharge: 2009-08-20 | Payer: Self-pay

## 2009-08-20 LAB — HM MAMMOGRAPHY: HM Mammogram: NEGATIVE

## 2009-08-22 ENCOUNTER — Ambulatory Visit: Payer: Self-pay | Admitting: Obstetrics & Gynecology

## 2009-08-27 ENCOUNTER — Ambulatory Visit: Payer: Self-pay | Admitting: Internal Medicine

## 2009-08-27 ENCOUNTER — Encounter: Payer: Self-pay | Admitting: *Deleted

## 2009-08-27 ENCOUNTER — Encounter: Payer: Self-pay | Admitting: Internal Medicine

## 2009-09-14 ENCOUNTER — Ambulatory Visit: Payer: Self-pay | Admitting: Family Medicine

## 2009-10-05 ENCOUNTER — Encounter: Payer: Self-pay | Admitting: Internal Medicine

## 2009-10-08 ENCOUNTER — Telehealth: Payer: Self-pay | Admitting: Internal Medicine

## 2009-10-17 ENCOUNTER — Telehealth: Payer: Self-pay | Admitting: Internal Medicine

## 2009-10-18 ENCOUNTER — Ambulatory Visit: Payer: Self-pay | Admitting: Internal Medicine

## 2009-10-18 DIAGNOSIS — M542 Cervicalgia: Secondary | ICD-10-CM

## 2009-10-18 DIAGNOSIS — R609 Edema, unspecified: Secondary | ICD-10-CM | POA: Insufficient documentation

## 2009-10-18 DIAGNOSIS — K089 Disorder of teeth and supporting structures, unspecified: Secondary | ICD-10-CM | POA: Insufficient documentation

## 2009-10-22 ENCOUNTER — Telehealth: Payer: Self-pay | Admitting: *Deleted

## 2009-10-22 DIAGNOSIS — D649 Anemia, unspecified: Secondary | ICD-10-CM

## 2009-10-23 LAB — CONVERTED CEMR LAB
Basophils Relative: 0 % (ref 0–1)
CO2: 23 meq/L (ref 19–32)
Calcium: 9.3 mg/dL (ref 8.4–10.5)
Chloride: 106 meq/L (ref 96–112)
Eosinophils Absolute: 0.2 10*3/uL (ref 0.0–0.7)
Glucose, Bld: 121 mg/dL — ABNORMAL HIGH (ref 70–99)
Hemoglobin: 11.9 g/dL — ABNORMAL LOW (ref 12.0–15.0)
MCHC: 32.2 g/dL (ref 30.0–36.0)
MCV: 88.3 fL (ref >=78.0)
Monocytes Absolute: 0.4 10*3/uL (ref 0.1–1.0)
Monocytes Relative: 6 % (ref 3–12)
RBC: 4.19 M/uL (ref 3.87–5.11)
Sodium: 141 meq/L (ref 135–145)
TSH: 0.442 microintl units/mL (ref 0.350–4.5)
Total Bilirubin: 0.9 mg/dL (ref 0.3–1.2)
Total Protein: 6.7 g/dL (ref 6.0–8.3)

## 2009-10-24 ENCOUNTER — Encounter: Admission: RE | Admit: 2009-10-24 | Discharge: 2009-10-24 | Payer: Self-pay | Admitting: Internal Medicine

## 2009-10-24 ENCOUNTER — Emergency Department (HOSPITAL_COMMUNITY): Admission: EM | Admit: 2009-10-24 | Discharge: 2009-10-24 | Payer: Self-pay | Admitting: Emergency Medicine

## 2009-10-26 ENCOUNTER — Ambulatory Visit: Payer: Self-pay | Admitting: Internal Medicine

## 2009-10-26 DIAGNOSIS — Z8679 Personal history of other diseases of the circulatory system: Secondary | ICD-10-CM | POA: Insufficient documentation

## 2009-10-26 LAB — CONVERTED CEMR LAB: Blood Glucose, Fingerstick: 84

## 2009-11-01 ENCOUNTER — Telehealth: Payer: Self-pay | Admitting: *Deleted

## 2009-11-13 ENCOUNTER — Encounter: Payer: Self-pay | Admitting: Internal Medicine

## 2009-11-16 ENCOUNTER — Encounter: Payer: Self-pay | Admitting: Internal Medicine

## 2009-11-19 ENCOUNTER — Telehealth: Payer: Self-pay | Admitting: Internal Medicine

## 2009-12-13 ENCOUNTER — Ambulatory Visit: Payer: Self-pay | Admitting: Internal Medicine

## 2009-12-13 ENCOUNTER — Ambulatory Visit (HOSPITAL_COMMUNITY): Admission: RE | Admit: 2009-12-13 | Discharge: 2009-12-13 | Payer: Self-pay | Admitting: Internal Medicine

## 2009-12-13 DIAGNOSIS — M25569 Pain in unspecified knee: Secondary | ICD-10-CM | POA: Insufficient documentation

## 2009-12-13 LAB — CONVERTED CEMR LAB: Hgb A1c MFr Bld: 7.7 %

## 2009-12-14 ENCOUNTER — Encounter: Payer: Self-pay | Admitting: Internal Medicine

## 2009-12-14 LAB — CONVERTED CEMR LAB
Basophils Absolute: 0 10*3/uL (ref 0.0–0.1)
Basophils Relative: 0 % (ref 0–1)
Eosinophils Absolute: 0.2 10*3/uL (ref 0.0–0.7)
Eosinophils Relative: 3 % (ref 0–5)
HCT: 40.9 % (ref 36.0–46.0)
Hemoglobin: 14 g/dL (ref 12.0–15.0)
MCHC: 34.2 g/dL (ref 30.0–36.0)
MCV: 84.7 fL (ref >=78.0)
Monocytes Absolute: 0.4 10*3/uL (ref 0.1–1.0)
Monocytes Relative: 7 % (ref 3–12)
Neutro Abs: 2.8 10*3/uL (ref 1.7–7.7)
RDW: 13.2 % (ref 11.5–15.5)

## 2009-12-17 ENCOUNTER — Ambulatory Visit: Payer: Self-pay | Admitting: Family Medicine

## 2009-12-18 ENCOUNTER — Telehealth: Payer: Self-pay | Admitting: Internal Medicine

## 2010-01-03 ENCOUNTER — Encounter: Payer: Self-pay | Admitting: Internal Medicine

## 2010-01-03 ENCOUNTER — Ambulatory Visit: Payer: Self-pay | Admitting: Internal Medicine

## 2010-01-03 LAB — CONVERTED CEMR LAB
BUN: 9 mg/dL (ref 6–23)
CO2: 26 meq/L (ref 19–32)
Calcium: 9.9 mg/dL (ref 8.4–10.5)
Creatinine, Ser: 0.67 mg/dL (ref 0.40–1.20)
Glucose, Bld: 183 mg/dL — ABNORMAL HIGH (ref 70–99)

## 2010-01-09 ENCOUNTER — Ambulatory Visit: Payer: Self-pay | Admitting: Internal Medicine

## 2010-01-09 ENCOUNTER — Encounter: Payer: Self-pay | Admitting: Internal Medicine

## 2010-01-09 LAB — CONVERTED CEMR LAB: Cholesterol: 202 mg/dL — ABNORMAL HIGH (ref 0–200)

## 2010-02-04 ENCOUNTER — Telehealth: Payer: Self-pay | Admitting: Internal Medicine

## 2010-02-05 ENCOUNTER — Telehealth: Payer: Self-pay | Admitting: Internal Medicine

## 2010-02-11 ENCOUNTER — Encounter: Payer: Self-pay | Admitting: Internal Medicine

## 2010-03-06 ENCOUNTER — Emergency Department (HOSPITAL_COMMUNITY): Admission: EM | Admit: 2010-03-06 | Discharge: 2010-03-06 | Payer: Self-pay | Admitting: Emergency Medicine

## 2010-04-11 ENCOUNTER — Ambulatory Visit: Payer: Self-pay | Admitting: Internal Medicine

## 2010-04-11 LAB — CONVERTED CEMR LAB: Blood Glucose, Fingerstick: 152

## 2010-04-15 ENCOUNTER — Emergency Department (HOSPITAL_COMMUNITY): Admission: EM | Admit: 2010-04-15 | Discharge: 2010-04-15 | Payer: Self-pay | Admitting: Family Medicine

## 2010-06-01 ENCOUNTER — Emergency Department (HOSPITAL_COMMUNITY)
Admission: EM | Admit: 2010-06-01 | Discharge: 2010-06-01 | Payer: Self-pay | Source: Home / Self Care | Admitting: Family Medicine

## 2010-06-13 ENCOUNTER — Telehealth: Payer: Self-pay | Admitting: Internal Medicine

## 2010-06-18 ENCOUNTER — Telehealth: Payer: Self-pay | Admitting: *Deleted

## 2010-06-18 ENCOUNTER — Ambulatory Visit: Payer: Self-pay | Admitting: Internal Medicine

## 2010-06-18 DIAGNOSIS — H109 Unspecified conjunctivitis: Secondary | ICD-10-CM | POA: Insufficient documentation

## 2010-06-18 LAB — CONVERTED CEMR LAB
Blood Glucose, Fingerstick: 139
Hgb A1c MFr Bld: 7.7 %

## 2010-06-18 IMAGING — CR DG KNEE 1-2V*R*
2 series · 2 of 2 positions shown · non-contrast
Comparison: Two views right knee 11/29/2006.

CLINICAL DATA: Knee pain.

RIGHT KNEE - 1-2 VIEW

[t knee ap right]
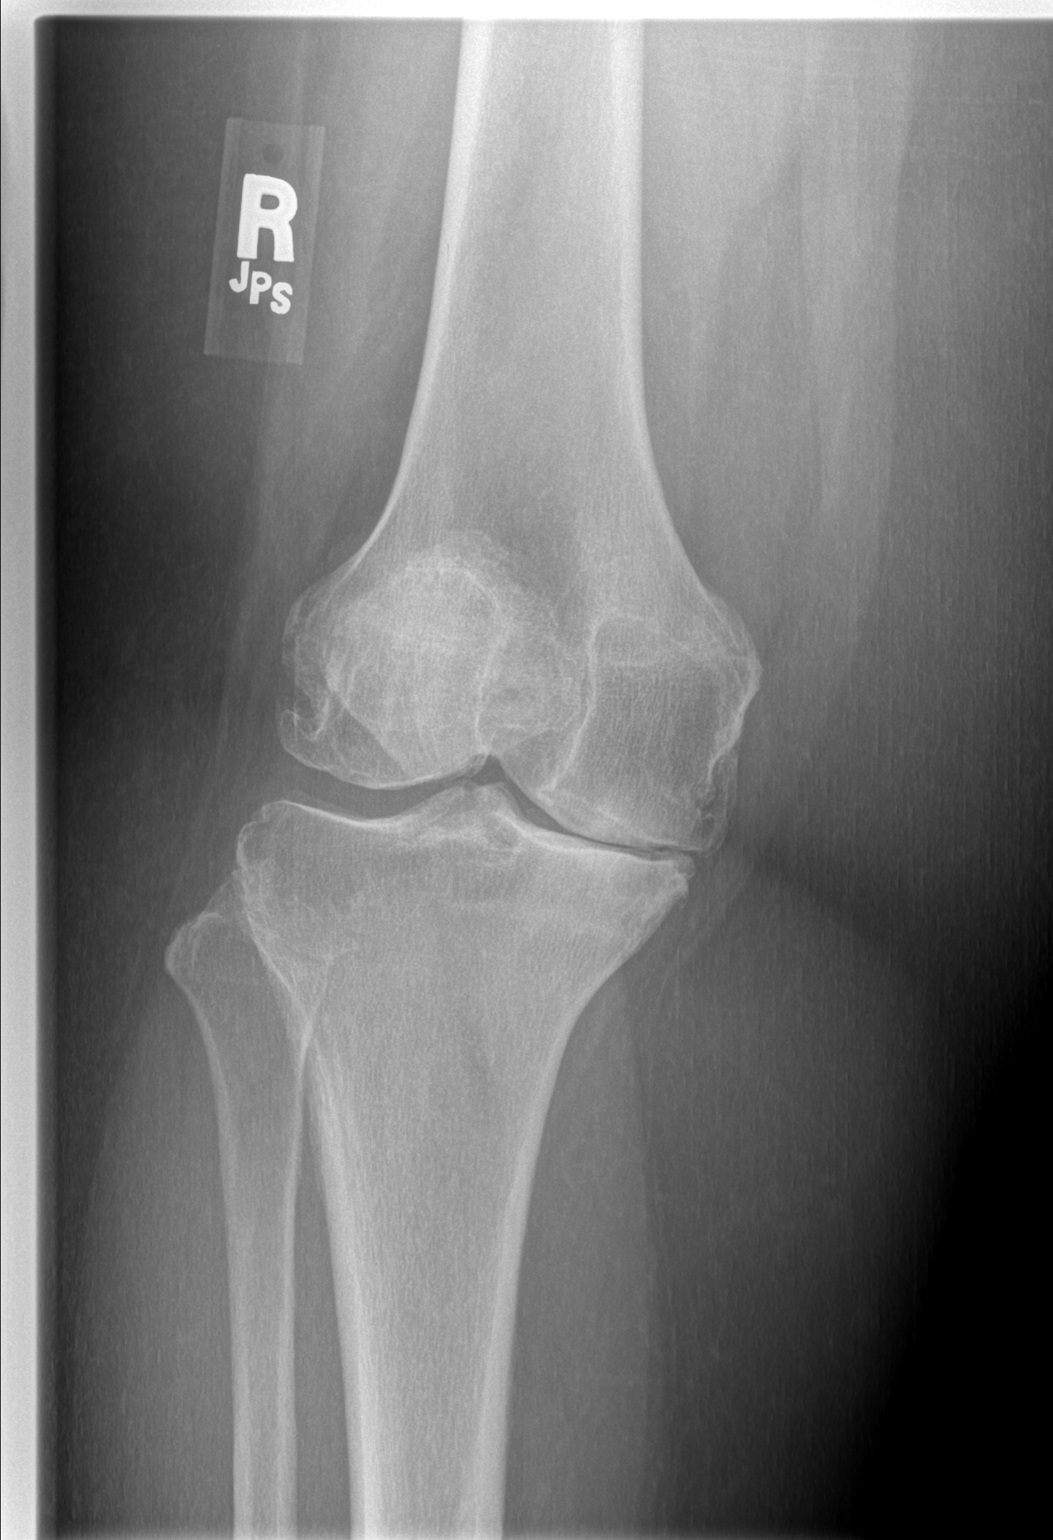

[t knee lat right]
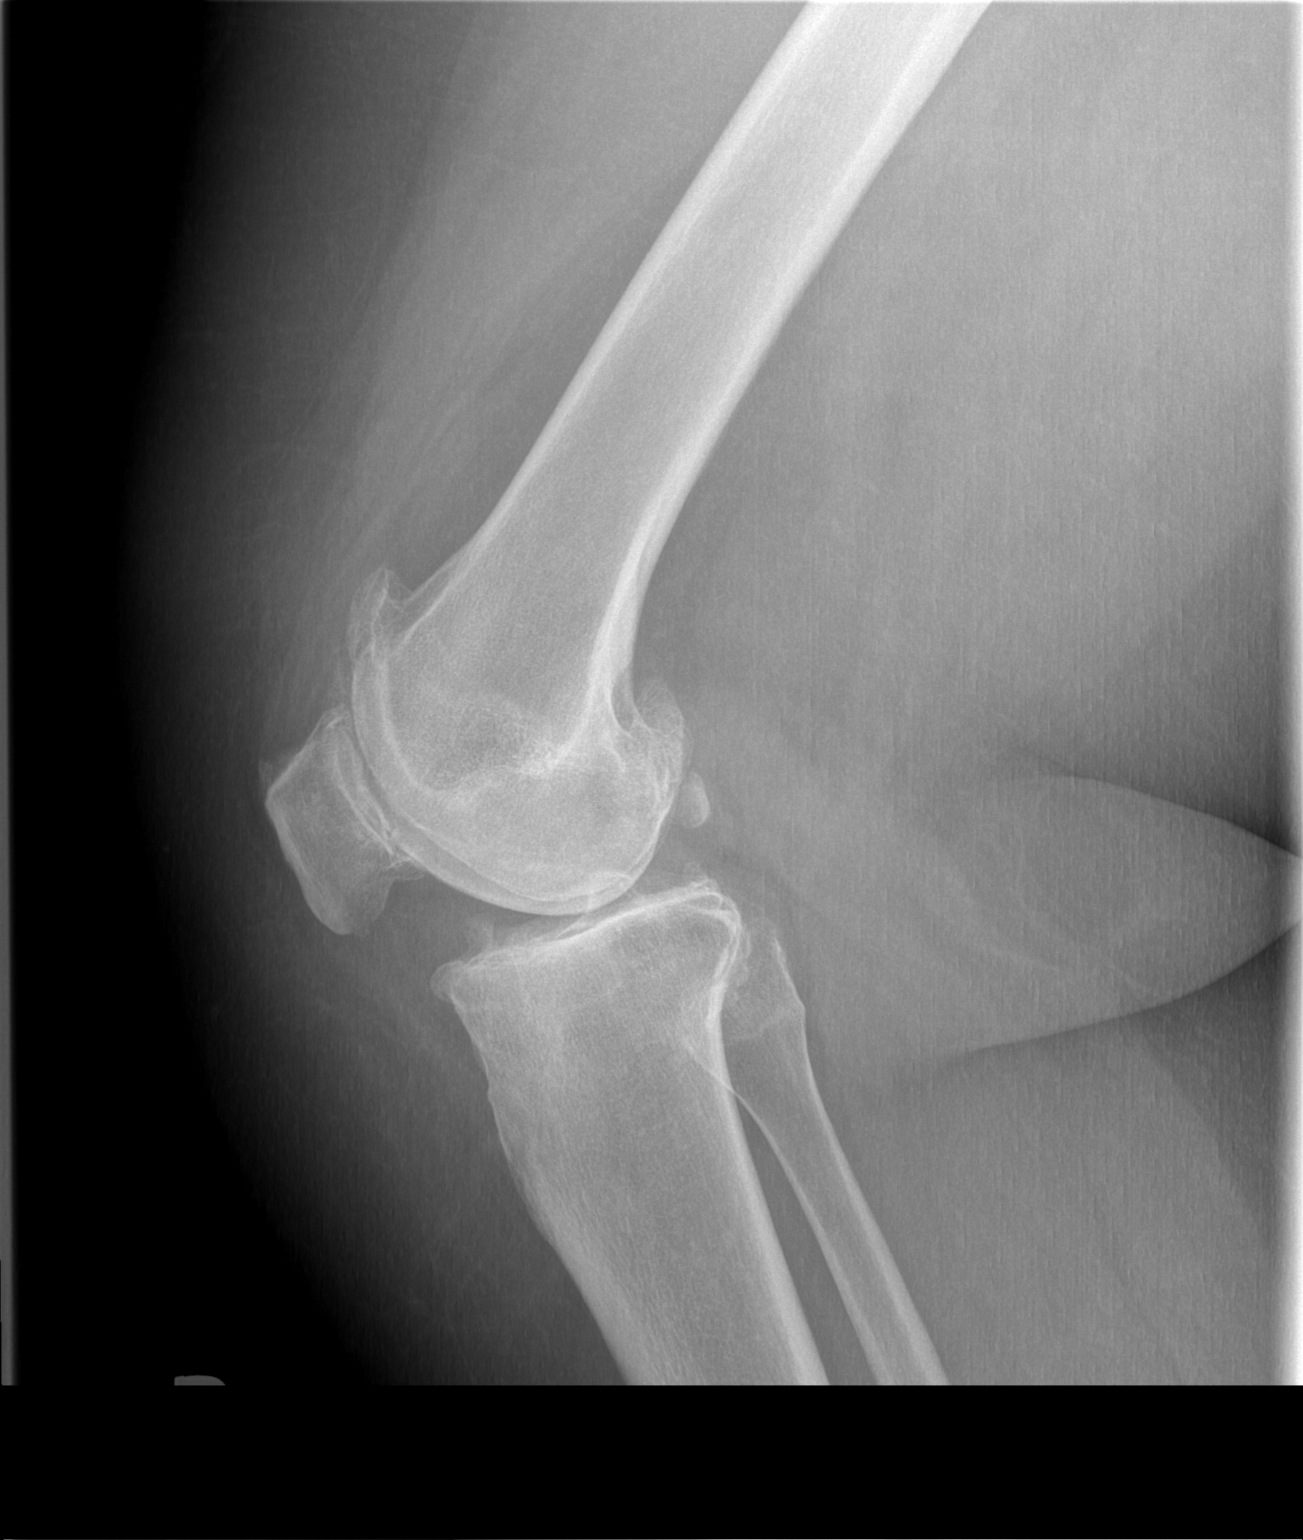

[2 of 2 positions shown; findings below may reference images not displayed]

FINDINGS: The patient has advanced tricompartmental degenerative
disease with bone-on-bone joint space narrowing of the medial and
patellofemoral compartments.  Bulky osteophytosis is present.
Osteoarthritis shows some progression.  There is no fracture or
joint effusion.
IMPRESSION: Some worsening of advanced degenerative disease about the knee
appearing worst in the medial compartment.

## 2010-06-25 ENCOUNTER — Ambulatory Visit: Payer: Self-pay | Admitting: Family Medicine

## 2010-06-25 ENCOUNTER — Encounter: Payer: Self-pay | Admitting: Internal Medicine

## 2010-06-25 ENCOUNTER — Telehealth (INDEPENDENT_AMBULATORY_CARE_PROVIDER_SITE_OTHER): Payer: Self-pay | Admitting: *Deleted

## 2010-07-01 ENCOUNTER — Ambulatory Visit: Admission: RE | Admit: 2010-07-01 | Discharge: 2010-07-01 | Payer: Self-pay | Source: Home / Self Care

## 2010-07-03 ENCOUNTER — Telehealth: Payer: Self-pay | Admitting: Internal Medicine

## 2010-07-04 ENCOUNTER — Ambulatory Visit
Admission: RE | Admit: 2010-07-04 | Discharge: 2010-07-04 | Payer: Self-pay | Source: Home / Self Care | Attending: Internal Medicine | Admitting: Internal Medicine

## 2010-07-04 DIAGNOSIS — L0291 Cutaneous abscess, unspecified: Secondary | ICD-10-CM | POA: Insufficient documentation

## 2010-07-04 DIAGNOSIS — L0390 Cellulitis, unspecified: Secondary | ICD-10-CM

## 2010-07-04 LAB — GLUCOSE, CAPILLARY: Glucose-Capillary: 318 mg/dL — ABNORMAL HIGH (ref 70–99)

## 2010-07-04 LAB — CONVERTED CEMR LAB: Blood Glucose, Fingerstick: 318

## 2010-07-05 ENCOUNTER — Encounter: Payer: Self-pay | Admitting: Family Medicine

## 2010-07-05 ENCOUNTER — Ambulatory Visit: Admission: RE | Admit: 2010-07-05 | Discharge: 2010-07-05 | Payer: Self-pay | Source: Home / Self Care

## 2010-07-12 ENCOUNTER — Telehealth (INDEPENDENT_AMBULATORY_CARE_PROVIDER_SITE_OTHER): Payer: Self-pay | Admitting: *Deleted

## 2010-07-21 ENCOUNTER — Encounter: Payer: Self-pay | Admitting: Family Medicine

## 2010-07-22 ENCOUNTER — Telehealth (INDEPENDENT_AMBULATORY_CARE_PROVIDER_SITE_OTHER): Payer: Self-pay | Admitting: *Deleted

## 2010-07-29 ENCOUNTER — Other Ambulatory Visit: Payer: Self-pay | Admitting: Internal Medicine

## 2010-07-29 DIAGNOSIS — Z1239 Encounter for other screening for malignant neoplasm of breast: Secondary | ICD-10-CM

## 2010-07-30 NOTE — Assessment & Plan Note (Signed)
Summary: NEEDS BP CHECK AND LAB WORK ONLY/GOLDING/CH   Vital Signs:  Patient profile:   70 year old female Height:      62.5 inches (158.75 cm) Weight:      246.03 pounds (111.83 kg) BMI:     44.44 Temp:     98.1 degrees F (36.72 degrees C) oral Pulse rate:   69 / minute BP sitting:   130 / 70  (right arm)  Vitals Entered By: Angelina Ok RN (January 03, 2010 9:04 AM) Comments Labs and B/P check only.   Primary Care Provider:  [name]  DO   History of Present Illness: Patient is here today for lab work and also for her BP check as she was recently seen by Dr Phillips Odor and was started on HCTZ.  Her BP is 130/70 today and was advised to continue her current meds.  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) 13)  Gout, Unspecified  (ICD-274.9) 14)  Foot Pain, Left  (ICD-729.5) 15)  Diarrhea, Recurrent  (ICD-787.91) 16)  Pruritus  (ICD-698.9) 17)  Health Maintenance Exam  (ICD-V70.0) 18)  Hx of Gerd  (ICD-530.81) 19)  Hx of Osteoarthrosis, Generalized, Multiple Sites  (ICD-715.09) 20)  Hx of Hearing Loss, Bilateral  (ICD-389.9) 21)  Hysterectomy, Hx of  (ICD-V45.77) 22)  Diabetes Mellitus, Type II  (ICD-250.00) 23)  Hypertension  (ICD-401.9) 24)  Hyperlipidemia  (ICD-272.4) 25)  Obesity  (ICD-278.00) 26)  Sleep Apnea, Obstructive  (ICD-327.23) 27)  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 Two Tabs By Mouth Twice Daily 2)  Zestril 40 Mg Tabs (Lisinopril) .... Take 1 Tablet By Mouth Once A Day 3)  Calcium 600/vitamin D 600-400 Mg-Unit Tabs (Calcium Carbonate-Vitamin D) .... Take 1 Tablet By Mouth Two Times A Day 4)  Bd Ultra-Fine Pen Needles 29g X 12.13mm Misc (Insulin Pen Needle) .... Use As Directed With Solostar Pen 5)  Amaryl 4 Mg Tabs (Glimepiride) .... Take One (1) By Mouth Once A Day 6)  Prandin 2 Mg Tabs (Repaglinide) .Marland Kitchen.. 1tab Po Three Times A Day By Mouth Tab Ac As Directed 7)  Voltaren 1 % Gel (Diclofenac Sodium) .... Apply Two Times A Day As Directed To Knees For Pain 8)  Table- Over The Bed Table(Rolls) Adjustable Height .... Dx: Arthritis 9)  Simvastatin 20 Mg Tabs (Simvastatin) .... Take 1 Tablet By Mouth Once A Day 10)  Vicodin 5-500 Mg Tabs (Hydrocodone-Acetaminophen) .... Take 1-2 Tablets By Mouth Every 6 Hours As Needed For Pain 11)  Robaxin 500 Mg Tabs (Methocarbamol) .... Take One Tab By Mouth Every 6 Hours As Needed For Muscle Spasm 12)  Vicodin 5-500 Mg Tabs (Hydrocodone-Acetaminophen) .... Take 1 Tablet By Mouth Every 6 Hours 13)  Hydrochlorothiazide 25 Mg Tabs (Hydrochlorothiazide) .... Take 1 Tablet By Mouth Once A Day  Current Medications (verified): 1)  Metformin Hcl 500 Mg Tabs (Metformin Hcl) .Marland KitchenMarland KitchenMarland Kitchen  Two Tabs By Mouth Twice Daily 2)  Zestril 40 Mg Tabs (Lisinopril) .... Take 1 Tablet By Mouth Once A Day 3)  Calcium 600/vitamin D 600-400 Mg-Unit Tabs (Calcium Carbonate-Vitamin D) .... Take 1 Tablet By Mouth Two Times A Day 4)  Bd Ultra-Fine Pen Needles 29g X 12.83mm Misc (Insulin Pen Needle) .... Use As Directed With Solostar Pen 5)  Amaryl 4 Mg Tabs (Glimepiride) .... Take One (1) By Mouth Once A Day 6)  Prandin 2 Mg Tabs (Repaglinide) .Marland Kitchen.. 1tab Po Three Times A Day By Mouth Tab Ac As Directed 7)  Voltaren 1 % Gel (Diclofenac Sodium) .... Apply Two Times A Day As Directed To Knees For Pain 8)  Table- Over The Bed Table(Rolls) Adjustable Height ....  Dx: Arthritis 9)  Simvastatin 20 Mg Tabs (Simvastatin) .... Take 1 Tablet By Mouth Once A Day 10)  Vicodin 5-500 Mg Tabs (Hydrocodone-Acetaminophen) .... Take 1-2 Tablets By Mouth Every 6 Hours As Needed For Pain 11)  Robaxin 500 Mg Tabs (Methocarbamol) .... Take One Tab By Mouth Every 6 Hours As Needed For Muscle Spasm 12)  Vicodin 5-500 Mg Tabs (Hydrocodone-Acetaminophen) .... Take 1 Tablet By Mouth Every 6 Hours 13)  Hydrochlorothiazide 25 Mg Tabs (Hydrochlorothiazide) .... Take 1 Tablet By Mouth Once A Day  Allergies (verified): 1)  ! Pcn 2)  ! Demerol  Review of Systems      See HPI  Physical Exam  Additional Exam:  Gen: AOx3, in no acute distress Eyes: PERRL, EOMI ENT:MMM, No erythema noted in posterior pharynx Neck: No JVD, No LAP Chest: CTAB with  good respiratory effort CVS: regular rhythmic rate, NO M/R/G, S1 S2 normal Abdo: soft,ND, BS+x4, Non tender and No hepatosplenomegaly EXT: No odema noted Neuro: Non focal, gait is normal Skin: no rashes noted.    Impression & Recommendations:  Problem # 1:  HYPERTENSION (ICD-401.9)  Her updated medication list for this problem includes:    Zestril 40 Mg Tabs (Lisinopril) .Marland Kitchen... Take 1 tablet by mouth once a day    Hydrochlorothiazide 25 Mg Tabs (Hydrochlorothiazide) .Marland Kitchen... Take 1 tablet by mouth once a day  BP today: 130/70 Prior BP: 141/71 (12/17/2009)  Prior 10 Yr Risk Heart Disease: 24 % (03/21/2009)  Labs Reviewed: K+: 3.9 (10/18/2009) Creat: : 0.57 (10/18/2009)   Chol: 200 (04/17/2009)   HDL: 55 (04/17/2009)   LDL: 127 (04/17/2009)   TG: 92 (04/17/2009)  Her updated medication list for this problem includes:    Zestril 40 Mg Tabs (Lisinopril) .Marland Kitchen... Take 1 tablet by mouth once a day    Hydrochlorothiazide 25 Mg Tabs (Hydrochlorothiazide) .Marland Kitchen... Take 1 tablet by mouth once a day  Complete Medication List: 1)  Metformin Hcl 500 Mg Tabs (Metformin hcl) .... Two tabs by mouth twice daily 2)  Zestril 40 Mg Tabs  (Lisinopril) .... Take 1 tablet by mouth once a day 3)  Calcium 600/vitamin D 600-400 Mg-unit Tabs (Calcium carbonate-vitamin d) .... Take 1 tablet by mouth two times a day 4)  Bd Ultra-fine Pen Needles 29g X 12.69mm Misc (Insulin pen needle) .... Use as directed with solostar pen 5)  Amaryl 4 Mg Tabs (Glimepiride) .... Take one (1) by mouth once a day 6)  Prandin 2 Mg Tabs (Repaglinide) .Marland Kitchen.. 1tab po three times a day by mouth tab ac as directed 7)  Voltaren 1 % Gel (Diclofenac sodium) .... Apply two times a day as directed to knees for pain 8)  Table- Over The Bed Table(rolls) Adjustable Height  .Marland KitchenMarland KitchenMarland Kitchen  Dx: arthritis 9)  Simvastatin 20 Mg Tabs (Simvastatin) .... Take 1 tablet by mouth once a day 10)  Vicodin 5-500 Mg Tabs (Hydrocodone-acetaminophen) .... Take 1-2 tablets by mouth every 6 hours as needed for pain 11)  Robaxin 500 Mg Tabs (Methocarbamol) .... Take one tab by mouth every 6 hours as needed for muscle spasm 12)  Vicodin 5-500 Mg Tabs (Hydrocodone-acetaminophen) .... Take 1 tablet by mouth every 6 hours 13)  Hydrochlorothiazide 25 Mg Tabs (Hydrochlorothiazide) .... Take 1 tablet by mouth once a day  Other Orders: T-Hgb A1C (in-house) ([id number]) T-Basic Metabolic Panel [id number]) Future Orders: T-Lipid Profile ([id number]) ... 01/10/2010  Patient Instructions: 1)  Please schedule an appointment with your primary doctor 3 months. 2)  It is important that you exercise regularly at least 20 minutes 5 times a week. If you develop chest pain, have severe difficulty breathing, or feel very tired , stop exercising immediately and seek medical attention. 3)  You need to lose weight. Consider a lower calorie diet and regular exercise.  4)  Check your Blood Pressure regularly. If it is above: you should make an appointment.   Vital Signs:  Patient profile:   70 year old female Height:      62.5 inches (158.75 cm) Weight:      246.03 pounds (111.83 kg) BMI:     44.44 Temp:     98.1  degrees F (36.72 degrees C) oral Pulse rate:   69 / minute BP sitting:   130 / 70  (right arm)  Vitals Entered By: Angelina Ok RN (January 03, 2010 9:04 AM)   Appended Document: Results of HGB A1C    Lab Visit  Laboratory Results   Blood Tests   Date/Time Received: January 03, 2010 9:40 AM  Date/Time Reported: Burke Keels  January 03, 2010 9:40 AM   HGBA1C: 7.9%   (Normal Range: Non-Diabetic - 3-6%   Control Diabetic - 6-8%)    Orders Today:

## 2010-07-30 NOTE — Miscellaneous (Signed)
Summary: A1c result//kg  Clinical Lists Changes  Observations: Added new observation of HGBA1C: 7.4 (in hospital) (08/07/2009 11:20)

## 2010-07-30 NOTE — Letter (Signed)
Summary: Generic Letter  [facility]  [address]   [place], [place] [address]   Phone: [phone]  Fax: [phone]    08/27/2009  RE: Katherine Christensen [address] [place], [place]  [address]  Dear Doctor, This letter is to certify Ms [name] has been medically cleared for surgery. Please contact our office if you have any other further questions or concerns.   Sincerely,   Lars Mage MD

## 2010-07-30 NOTE — Progress Notes (Signed)
Summary: refill/gg  Phone Note Refill Request  on October 08, 2009 12:17 PM  Refills Requested: Medication #1:  METFORMIN HCL 500 MG TABS Two tabs by mouth twice daily   Last Refilled: 08/14/2009  Method Requested: Electronic Initial call taken by: Merrie Roof RN,  October 08, 2009 12:17 PM  Follow-up for Phone Call        Refill approved-nurse to complete Follow-up by: [name]  DO,  October 08, 2009 1:45 PM    Prescriptions: METFORMIN HCL 500 MG TABS (METFORMIN HCL) Two tabs by mouth twice daily  #120 x 0   Entered and Authorized by:   [name]  DO   Signed by:   [name]  DO on 10/08/2009   Method used:   Electronically to        [facility] (retail)       [address]       [place], [place]  [address]       Ph: [phone]       Fax: [phone]   RxID:   [id number]

## 2010-07-30 NOTE — Letter (Signed)
Summary: [facility]  [facility]   Imported By: Margie Billet 10/08/2009 11:35:23  _____________________________________________________________________  External Attachment:    Type:   Image     Comment:   External Document

## 2010-07-30 NOTE — Progress Notes (Signed)
Summary: phone/gg  Phone Note Call from Patient   Caller: Patient Summary of Call: Pt called for lab results. Pt not at home to receive results.  Call was answered within 30 minutes. Initial call taken by: Merrie Roof RN,  Nov 01, 2009 9:19 AM

## 2010-07-30 NOTE — Progress Notes (Signed)
Summary: phone/gg  Phone Note Call from Patient   Caller: Patient Summary of Call: Pt seen on 1 /12  at [facility] for URI and strep infection.  She finished last z-pac  today.  Coughing seems increased but less productive.    Temp is 99.4   Eating and drinking okay.  She was told if not better to see [place].  She is not sure if she needs to come in or give it time for the cough to improve.  Please advise Pt # [id number] Initial call taken by: Merrie Roof RN,  July 16, 2009 8:55 AM  Follow-up for Phone Call        Give it a few more days and if not better we can schedule her to be seen. Make sure she compltes her abx. Fluids and saline nasal spray. Can take OTC Zyrtec for congestion. Follow-up by: [name]  DO,  July 16, 2009 10:34 AM  Additional Follow-up for Phone Call Additional follow up Details #1::        tried to call pt X 2 and no answer.  message left to return my call Additional Follow-up by: Merrie Roof RN,  July 16, 2009 3:56 PM    Additional Follow-up for Phone Call Additional follow up Details #2::    Reached pt at home and message given.  Pt voices understanding Follow-up by: Merrie Roof RN,  July 16, 2009 3:58 PM

## 2010-07-30 NOTE — Letter (Signed)
Summary: [facility]  [facility]   Imported By: Margie Billet 10/08/2009 12:09:40  _____________________________________________________________________  External Attachment:    Type:   Image     Comment:   External Document

## 2010-07-30 NOTE — Progress Notes (Signed)
Summary: Refill/gh  Phone Note Refill Request Message from:  Fax from Pharmacy on December 18, 2009 9:15 AM  Refills Requested: Medication #1:  METFORMIN HCL 500 MG TABS Two tabs by mouth twice daily   Last Refilled: 10/08/2009 Last office visit was 12/13/2009.  Last labs were 10/18/2009.   Method Requested: Electronic Initial call taken by: Angelina Ok RN,  December 18, 2009 9:15 AM  Follow-up for Phone Call        Has appt in July.  Dr Phillips Odor wanted pt to F/U with her in Sep.  Will give three month refill Follow-up by: Blanch Media MD,  December 18, 2009 10:13 AM    Prescriptions: METFORMIN HCL 500 MG TABS (METFORMIN HCL) Two tabs by mouth twice daily  #120 x 2   Entered and Authorized by:   Blanch Media MD   Signed by:   Blanch Media MD on 12/18/2009   Method used:   Electronically to        [facility] (retail)       [address]       [place], [place]  [id number]       Ph: [phone]       Fax: [phone]   RxID:   [id number]

## 2010-07-30 NOTE — Progress Notes (Signed)
Summary: med refill/.gp  Phone Note Refill Request Message from:  Patient on August 13, 2009 4:48 PM  Refills Requested: Medication #1:  METFORMIN HCL 500 MG TABS Two tabs by mouth twice daily Last appt. 04/16/09.             Pt has been out of diabetic  meds for days.   Method Requested: Electronic Initial call taken by: Chinita Pester RN,  August 13, 2009 4:48 PM  Follow-up for Phone Call        needs an appointment with who ever can see her and an appointment with Jamison Neighbor- i put in for one month of meds for her Follow-up by: [name]  DO,  August 14, 2009 11:13 AM  Additional Follow-up for Phone Call Additional follow up Details #1::        Pt informed and will schedule both appointments. Additional Follow-up by: Merrie Roof RN,  August 14, 2009 11:30 AM    Prescriptions: PRANDIN 2 MG TABS (REPAGLINIDE) 1tab po three times a day by mouth tab ac as directed  #90 x 0   Entered and Authorized by:   [name]  DO   Signed by:   [name]  DO on 08/14/2009   Method used:   Electronically to        [facility] (retail)       [address]       [place], [place]  [id number]       Ph: [phone]       Fax: [phone]   RxID:   [id number] AMARYL 4 MG TABS (GLIMEPIRIDE) Take one (1) by mouth once a day  #30 x 0   Entered and Authorized by:   [name]  DO   Signed by:   [name]  DO on 08/14/2009   Method used:   Electronically to        [facility] (retail)       [address]       [place], [place]  [id number]       Ph: [phone]       Fax: [phone]   RxID:   [id number] METFORMIN HCL 500 MG TABS (METFORMIN HCL) Two tabs by mouth twice daily  #120 x 0   Entered and Authorized by:   [name]  DO   Signed by:   [name]  DO on 08/14/2009   Method used:   Electronically to        [facility] (retail)       [address]  [place], [place]  [id number]       Ph: [phone]       Fax: [phone]   RxID:   [id number]

## 2010-07-30 NOTE — Letter (Signed)
Summary: Generic Letter  [facility]  [address]   [place], [place] [address]   Phone: [phone]  Fax: [phone]    10/05/2009  TD:DUKGUR Donovan [address] [place], [place]  [address]  To Whom It May Concern:  This letter is being written on behalf of Ms. Katherine Christensen who has been a patient of mine since May 2008. She suffers from Obesity, Hypertension, Diabetes Mellitus Type 2, and Sleep Apnea. Her current weight is 246 lbs and her BMI is 44. She has had multiple failed attempts at weight loss with Medical Nutritional Therapy and formal Diabetes Education.   I am in support of her seeking bariatric surgery given her multiple co-morbities and failed attempts at weight loss. I think her efforts have been genuine and her health has suffered tremdously in the setting of obesity.    Thank you for your consideration. Please contact me for further questions or to discuss her care.    Sincerely,   [name]  DO

## 2010-07-30 NOTE — Progress Notes (Signed)
Summary: Refill/gh  Phone Note Refill Request Message from:  Fax from Pharmacy on Nov 19, 2009 12:17 PM  Refills Requested: Medication #1:  AMARYL 4 MG TABS Take one (1) by mouth once a day   Last Refilled: 10/05/2009  Method Requested: Electronic Initial call taken by: Angelina Ok RN,  Nov 19, 2009 12:17 PM  Follow-up for Phone Call        Refill approved-nurse to complete Follow-up by: [name]  DO,  Nov 19, 2009 3:55 PM    Prescriptions: AMARYL 4 MG TABS (GLIMEPIRIDE) Take one (1) by mouth once a day  #30 x 3   Entered and Authorized by:   [name]  DO   Signed by:   [name]  DO on 11/19/2009   Method used:   Electronically to        [facility] (retail)       [address]       [place], [place]  [id number]       Ph: [phone]       Fax: [phone]   RxID:   [id number]

## 2010-07-30 NOTE — Assessment & Plan Note (Signed)
Summary: stiff neck x 1wk, swollen ankles x 3 days/pcp-golding/hla   Vital Signs:  Patient profile:   70 year old female Height:      62.5 inches (158.75 cm) Weight:      255.8 pounds (116.27 kg) BMI:     46.21 Temp:     98.5 degrees F (36.94 degrees C) oral Pulse rate:   77 / minute BP sitting:   152 / 76  (left arm)  Vitals Entered By: Blenda Mounts (October 18, 2009 9:02 AM)/Gladys Herbin, RN October 18, 2009 9:02 AM CC: sore neck, feet swollen Is Patient Diabetic? Yes Did you bring your meter with you today? Yes Pain Assessment Patient in pain? yes     Location: neck, feet Intensity: 4 Type: sore Onset of pain  movement increases pain Nutritional Status Detail not eating as much but still gaining weight  Have you ever been in a relationship where you felt threatened, hurt or afraid?No   Does patient need assistance? Functional Status Self care Ambulation Normal   Primary Care Provider:  [name]  DO  CC:  sore neck and feet swollen.  History of Present Illness: Katherine Christensen comes in today complaining of neck pain that started about 2 weeks ago. Started when she woke up from sleep- increasd stiffness and reduced ROM. Progressively getting worse- especially after she had a tooth extracted last week. Pain in her lower left jaw, dentist took xr and said there was no infection. Also tells me she is having LE edema. Worse after standing all day. 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 Does not have chest pain, SOB. Some nasal congestion-clear and a mild cough present.  Preventive Screening-Counseling & Management  Alcohol-Tobacco     Alcohol drinks/day: 0     Smoking Status: never     Year Quit: 1990  Caffeine-Diet-Exercise     Does Patient Exercise: yes     Type of exercise: walking     Times/week: 2  Current Medications (verified): 1)  Metformin Hcl 500 Mg Tabs (Metformin Hcl) .... Two Tabs By Mouth Twice Daily 2)  Zestril 40 Mg Tabs (Lisinopril) .... Take 1 Tablet By  Mouth Once A Day 3)  Calcium 600/vitamin D 600-400 Mg-Unit Tabs (Calcium Carbonate-Vitamin D) .... Take 1 Tablet By Mouth Two Times A Day 4)  Bd Ultra-Fine Pen Needles 29g X 12.19mm Misc (Insulin Pen Needle) .... Use As Directed With Solostar Pen 5)  Amaryl 4 Mg Tabs (Glimepiride) .... Take One (1) By Mouth Once A Day 6)  Prandin 2 Mg Tabs (Repaglinide) .Marland Kitchen.. 1tab Po Three Times A Day By Mouth Tab Ac As Directed 7)  Voltaren 1 % Gel (Diclofenac Sodium) .... Apply Two Times A Day As Directed To Knees For Pain 8)  Table- Over The Bed Table(Rolls) Adjustable Height .... Dx: Arthritis 9)  Simvastatin 20 Mg Tabs (Simvastatin) .... Take 1 Tablet By Mouth Once A Day 10)  Vicodin 5-500 Mg Tabs (Hydrocodone-Acetaminophen) .... Take 1-2 Tablets By Mouth Every 6 Hours As Needed For Pain 11)  Robaxin 500 Mg Tabs (Methocarbamol) .... Take One Tab By Mouth Every 6 Hours As Needed For Muscle Spasm  Allergies (verified): 1)  ! Pcn 2)  ! Demerol  Review of Systems      See HPI  Physical Exam  General:  alert, well-developed, and overweight-appearing.   Neck:  decreased ROM. Supple no masses Lungs:  CTAB Heart:  RRR Abdomen:  soft and non-tender.   Msk:  Multiple upper back and  neck trigger points. Trap muscle spasm severe. Occip cond tenderness. Pulses:  R and L carotid,radial,femoral,dorsalis pedis and posterior tibial pulses are full and equal bilaterally Extremities:  1+ pitting edema in lower legs Neurologic:  No cranial nerve deficits noted. Station and gait are normal. Plantar reflexes are down-going bilaterally. DTRs are symmetrical throughout. Sensory, motor and coordinative functions appear intact. Skin:  turgor normal, color normal, and no rashes.   Psych:  normally interactive and good eye contact.     Impression & Recommendations:  Problem # 1:  NECK PAIN (ICD-723.1) Findings consistent with muscle spasm. OMT to head, neck and upper back. ME, MFR, and HVLA tolerated well. Rec. heat and  IBP and vicodin for pain at night. Trail of robaxin for muscle spasm. F/U if not improved.  The following medications were removed from the medication list:    Advil Migraine 200 Mg Caps (Ibuprofen) .Marland Kitchen... Take 1 pill every 6 hours as needed for pain    Darvocet-n 50 50-325 Mg Tabs (Propoxyphene n-apap) .Marland Kitchen... Take 1-2  tablets by mouth every 6 hours as neede for pain Her updated medication list for this problem includes:    Vicodin 5-500 Mg Tabs (Hydrocodone-acetaminophen) .Marland Kitchen... Take 1-2 tablets by mouth every 6 hours as needed for pain    Robaxin 500 Mg Tabs (Methocarbamol) .Marland Kitchen... Take one tab by mouth every 6 hours as needed for muscle spasm  Orders: T-CBC w/Diff ([id number])  Problem # 2:  DENTAL PAIN (ICD-525.9) recent tooth extraction. Area looks like it is healing well. no signs of infection.  Problem # 3:  DIABETES MELLITUS, TYPE II (ICD-250.00) Tolerating meds well. A1C 7.4. Will see her in two month to discuss DM and getting her A1C below 7.0. I oted weight gain today, but she has edema in her legs. Labs today. She will bring meter to next visit- today she was more concerned about her neck pain and leg swelling.  Her updated medication list for this problem includes:    Metformin Hcl 500 Mg Tabs (Metformin hcl) .Marland Kitchen..Marland Kitchen Two tabs by mouth twice daily    Zestril 40 Mg Tabs (Lisinopril) .Marland Kitchen... Take 1 tablet by mouth once a day    Amaryl 4 Mg Tabs (Glimepiride) .Marland Kitchen... Take one (1) by mouth once a day    Prandin 2 Mg Tabs (Repaglinide) .Marland Kitchen... 1tab po three times a day by mouth tab ac as directed  Orders: Ophthalmology Referral (Ophthalmology)  Labs Reviewed: Creat: 0.63 (07/11/2008)     Last Eye Exam: No diabetic retinopathy.   Glaucoma suspected.  (10/03/2007) Reviewed HgBA1c results: 7.4 (in hospital) (08/07/2009)  7.5 (02/22/2009)  Problem # 4:  EDEMA (ICD-782.3) worseing LE edema. dependent LE, worse after work. weight gain. Will check labs today and follow-up.   Complete  Medication List: 1)  Metformin Hcl 500 Mg Tabs (Metformin hcl) .... Two tabs by mouth twice daily 2)  Zestril 40 Mg Tabs (Lisinopril) .... Take 1 tablet by mouth once a day 3)  Calcium 600/vitamin D 600-400 Mg-unit Tabs (Calcium carbonate-vitamin d) .... Take 1 tablet by mouth two times a day 4)  Bd Ultra-fine Pen Needles 29g X 12.31mm Misc (Insulin pen needle) .... Use as directed with solostar pen 5)  Amaryl 4 Mg Tabs (Glimepiride) .... Take one (1) by mouth once a day 6)  Prandin 2 Mg Tabs (Repaglinide) .Marland Kitchen.. 1tab po three times a day by mouth tab ac as directed 7)  Voltaren 1 % Gel (Diclofenac sodium) .... Apply two times a day  as directed to knees for pain 8)  Table- Over The Bed Table(rolls) Adjustable Height  .... Dx: arthritis 9)  Simvastatin 20 Mg Tabs (Simvastatin) .... Take 1 tablet by mouth once a day 10)  Vicodin 5-500 Mg Tabs (Hydrocodone-acetaminophen) .... Take 1-2 tablets by mouth every 6 hours as needed for pain 11)  Robaxin 500 Mg Tabs (Methocarbamol) .... Take one tab by mouth every 6 hours as needed for muscle spasm  Other Orders: T-Comprehensive Metabolic Panel ([id number]) T-TSH ([id number])  Patient Instructions: 1)  Please schedule a follow-up appointment in 2 months. Prescriptions: ROBAXIN 500 MG TABS (METHOCARBAMOL) Take one tab by mouth every 6 hours as needed for muscle spasm  #40 x 0   Entered and Authorized by:   [name]  DO   Signed by:   [name]  DO on 10/18/2009   Method used:   Print then Give to Patient   RxID:   [id number] VICODIN 5-500 MG TABS (HYDROCODONE-ACETAMINOPHEN) Take 1-2 tablets by mouth every 6 hours as needed for pain  #40 x 0   Entered and Authorized by:   [name]  DO   Signed by:   [name]  DO on 10/18/2009   Method used:   Print then Give to Patient   RxID:   [id number]  Process Orders Check Orders Results:     [facility]: Check successful Tests Sent for requisitioning (October 18, 2009 9:44 AM):     10/18/2009: [facility] -- T-Comprehensive Metabolic Panel [[id number]] (signed)     10/18/2009: [facility] -- T-CBC w/Diff [[id number]] (signed)     10/18/2009: [facility] -- T-TSH [phone] (signed)    Prevention & Chronic Care Immunizations   Influenza vaccine: Not documented   Influenza vaccine deferral: Refused  (08/27/2009)    Tetanus booster: Not documented   Td booster deferral: Deferred  (08/27/2009)    Pneumococcal vaccine: Not documented   Pneumococcal vaccine deferral: Not indicated  (08/27/2009)    H. zoster vaccine: Not documented   H. zoster vaccine deferral: Deferred  (08/27/2009)  Colorectal Screening   Hemoccult: Not documented   Hemoccult action/deferral: Not indicated  (04/16/2009)    Colonoscopy: Normal  (07/28/2006)   Colonoscopy due: 01/2016  Other Screening   Pap smear: Not documented   Pap smear action/deferral: Not indicated S/P hysterectomy  (08/27/2009)    Mammogram: ASSESSMENT: Negative - BI-RADS 1^MM DIGITAL SCREENING  (08/20/2009)   Mammogram action/deferral: Deferred  (08/27/2009)   Mammogram due: 08/2007    DXA bone density scan: Not documented   DXA bone density action/deferral: Deferred  (08/27/2009)   Smoking status: never  (10/18/2009)  Diabetes Mellitus   HgbA1C: 7.4 (in hospital)  (08/07/2009)   HgbA1C action/deferral: Deferred  (08/27/2009)    Eye exam: No diabetic retinopathy.   Glaucoma suspected.   (10/03/2007)   Diabetic eye exam action/deferral: Ophthalmology referral  (10/18/2009)    Foot exam: yes  (05/01/2008)   Foot exam action/deferral: Do today   High risk foot: Not documented   Foot care education: Not documented   Foot exam due: 10/18/2008    Urine microalbumin/creatinine ratio: 3.7  (04/17/2009)    Diabetes flowsheet reviewed?: Yes   Progress toward A1C goal: Unchanged  Lipids   Total Cholesterol: 200  (04/17/2009)   LDL: 127   (04/17/2009)   LDL Direct: Not documented   HDL: 55  (04/17/2009)   Triglycerides: 92  (04/17/2009)    SGOT (AST): 10  (07/11/2008)  SGPT (ALT): 13  (07/11/2008) CMP ordered    Alkaline phosphatase: 67  (07/11/2008)   Total bilirubin: 1.0  (07/11/2008)    Lipid flowsheet reviewed?: Yes   Progress toward LDL goal: Unchanged  Hypertension   Last Blood Pressure: 152 / 76  (10/18/2009)   Serum creatinine: 0.63  (07/11/2008)   BMP action: Deferred   Serum potassium 4.3  (07/11/2008) CMP ordered     Hypertension flowsheet reviewed?: Yes   Progress toward BP goal: Deteriorated  Self-Management Support :   Personal Goals (by the next clinic visit) :     Personal A1C goal: 7  (03/21/2009)     Personal blood pressure goal: 140/90  (03/21/2009)     Personal LDL goal: 100  (03/21/2009)    Patient will work on the following items until the next clinic visit to reach self-care goals:     Medications and monitoring: take my medicines every day, check my blood sugar, check my blood pressure, bring all of my medications to every visit  (10/18/2009)     Eating: drink diet soda or water instead of juice or soda, eat more vegetables, use fresh or frozen vegetables, eat foods that are low in salt, eat baked foods instead of fried foods  (10/18/2009)     Activity: take a 30 minute walk every day  (10/18/2009)     Other: congratulations on your weight loss, continue the good work  (04/16/2009)    Diabetes self-management support: Written self-care plan, Education handout, Resources for patients handout, Referred for DM self-management training  (10/18/2009)   Diabetes care plan printed   Diabetes education handout printed   Last diabetes self-management training by diabetes educator: 12/02/2007   Last medical nutrition therapy: 05/01/2008    Hypertension self-management support: Written self-care plan, Resources for patients handout  (10/18/2009)   Hypertension self-care plan printed.    Lipid  self-management support: Written self-care plan, Resources for patients handout  (10/18/2009)   Lipid self-care plan printed.      Resource handout printed.   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) GOUT, UNSPECIFIED (ICD-274.9) FOOT PAIN, LEFT (ICD-729.5) DIARRHEA, RECURRENT (ICD-787.91) PRURITUS (ICD-698.9) HEALTH MAINTENANCE EXAM (ICD-V70.0) Hx of GERD (ICD-530.81) Hx of OSTEOARTHROSIS, GENERALIZED, MULTIPLE SITES (ICD-715.09) Hx of HEARING LOSS, BILATERAL (ICD-389.9) HYSTERECTOMY, HX OF (ICD-V45.77) DIABETES MELLITUS, TYPE II (ICD-250.00) HYPERTENSION (ICD-401.9) HYPERLIPIDEMIA (ICD-272.4) OBESITY (ICD-278.00) SLEEP APNEA, OBSTRUCTIVE (ICD-327.23) 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

## 2010-07-30 NOTE — Letter (Signed)
Summary: NUTRITION AND DIABETES  NUTRITION AND DIABETES   Imported By: Margie Billet 11/21/2009 11:26:22  _____________________________________________________________________  External Attachment:    Type:   Image     Comment:   External Document

## 2010-07-30 NOTE — Assessment & Plan Note (Signed)
Summary: urgent care f/u-(regalado)/cfb   Vital Signs:  Patient profile:   70 year old female Height:      62.5 inches (158.75 cm) Weight:      248.4 pounds (112.91 kg) BMI:     44.87 Temp:     98.4 degrees F oral Pulse rate:   70 / minute BP sitting:   146 / 69  (right arm)  Vitals Entered By: Chinita Pester RN (October 26, 2009 2:55 PM) CC: Urgent care f/u  for wheezing and irregual heartbeat. Is Patient Diabetic? Yes Did you bring your meter with you today? No Pain Assessment Patient in pain? no      Nutritional Status BMI of > 30 = obese CBG Result 84  Have you ever been in a relationship where you felt threatened, hurt or afraid?No   Does patient need assistance? Functional Status Self care Ambulation Normal   Primary Care Provider:  [name]  DO  CC:  Urgent care f/u  for wheezing and irregual heartbeat.Marland Kitchen  History of Present Illness: Pt is a 70 yo AAF with PMH of DM, HTN, HLD and obesity who came to the clinic for regular f/u after urgent care visit to her wheezing. She said that she had the cold about a week ago, then she still felt on and off wheezing after the cold gone. Now she feels well, no fever, chill, cough or heart burning. She has been taking her medications as instructed. No diarrhea, dysuria. Denies smoking or ETOH. She has irrular rate about 20 years and has done full wrk up before, no remarkable finding.   Depression History:      The patient denies a depressed mood most of the day and a diminished interest in her usual daily activities.         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) 11)  Gout, Unspecified  (ICD-274.9) 12)  Foot Pain, Left  (ICD-729.5) 13)  Diarrhea, Recurrent  (ICD-787.91) 14)  Pruritus  (ICD-698.9) 15)  Health Maintenance Exam  (ICD-V70.0) 16)  Hx of Gerd  (ICD-530.81) 17)  Hx of Osteoarthrosis, Generalized, Multiple Sites  (ICD-715.09) 18)  Hx of Hearing Loss, Bilateral  (ICD-389.9) 19)  Hysterectomy, Hx of  (ICD-V45.77) 20)  Diabetes Mellitus, Type II  (ICD-250.00) 21)  Hypertension  (ICD-401.9) 22)  Hyperlipidemia  (ICD-272.4) 23)  Obesity  (ICD-278.00) 24)  Sleep Apnea, Obstructive  (ICD-327.23) 25)  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 Two Tabs By Mouth Twice Daily 2)  Zestril 40 Mg Tabs (Lisinopril) .... Take 1 Tablet By Mouth Once A Day 3)  Calcium 600/vitamin D 600-400 Mg-Unit Tabs (Calcium Carbonate-Vitamin D) .... Take 1  Tablet By Mouth Two Times A Day 4)  Bd Ultra-Fine Pen Needles 29g X 12.16mm Misc (Insulin Pen Needle) .... Use As Directed With Solostar Pen 5)  Amaryl 4 Mg Tabs (Glimepiride) .... Take One (1) By Mouth Once A Day 6)  Prandin 2 Mg Tabs (Repaglinide) .Marland Kitchen.. 1tab Po Three Times A Day By Mouth Tab Ac As Directed 7)  Voltaren 1 % Gel (Diclofenac Sodium) .... Apply Two Times A Day As Directed To Knees For Pain 8)  Table- Over The Bed Table(Rolls) Adjustable Height .... Dx: Arthritis 9)  Simvastatin 20 Mg Tabs (Simvastatin) .... Take 1 Tablet By Mouth Once A Day 10)  Vicodin 5-500 Mg Tabs (Hydrocodone-Acetaminophen) .... Take 1-2 Tablets By Mouth Every 6 Hours As Needed For Pain 11)  Robaxin 500 Mg Tabs (Methocarbamol) ....  Take One Tab By Mouth Every 6 Hours As Needed For Muscle Spasm 12)  Cleocin 150 Mg Caps (Clindamycin Hcl) .... Take 1 Tablet By Mouth Two Times A Day 13)  Vicodin 5-500 Mg Tabs (Hydrocodone-Acetaminophen) .... Take 1 Tablet By Mouth Every 6 Hours  Current Medications (verified): 1)  Metformin Hcl 500 Mg Tabs (Metformin Hcl) .... Two Tabs By Mouth Twice Daily 2)  Zestril 40 Mg Tabs (Lisinopril) .... Take 1 Tablet By Mouth Once A Day 3)  Calcium 600/vitamin D 600-400 Mg-Unit Tabs (Calcium Carbonate-Vitamin D) .... Take 1 Tablet By Mouth Two Times A Day 4)  Bd Ultra-Fine Pen Needles 29g X 12.80mm Misc (Insulin Pen Needle) .... Use As Directed With Solostar Pen 5)  Amaryl 4 Mg Tabs (Glimepiride) .... Take One (1) By Mouth Once A Day 6)  Prandin 2 Mg Tabs (Repaglinide) .Marland Kitchen.. 1tab Po Three Times A Day By Mouth Tab Ac As Directed 7)  Voltaren 1 % Gel (Diclofenac Sodium) .... Apply Two Times A Day As Directed To Knees For Pain 8)  Table- Over The Bed Table(Rolls) Adjustable Height .... Dx: Arthritis 9)  Simvastatin 20 Mg Tabs (Simvastatin) .... Take 1 Tablet By Mouth Once A Day 10)  Vicodin 5-500 Mg Tabs (Hydrocodone-Acetaminophen) .... Take 1-2 Tablets By Mouth Every 6 Hours As Needed For Pain 11)  Robaxin 500 Mg Tabs (Methocarbamol) .... Take One Tab By Mouth Every 6 Hours As Needed For Muscle Spasm 12)  Cleocin 150 Mg Caps (Clindamycin Hcl) .... Take 1 Tablet By Mouth Two Times A Day 13)  Vicodin 5-500 Mg Tabs (Hydrocodone-Acetaminophen) .... Take 1 Tablet By Mouth Every 6 Hours  Allergies (verified): 1)  ! Pcn 2)  ! Demerol  Past History:  Past Medical History: Last updated: 07/11/2008 Diabetes mellitus, type II Hyperlipidemia Hypertension Gout  Past Surgical History: Last updated: 09/14/2009 Hysterectomy-1982-D/T fibroids Knee arthroscopy Laparoscopy D and C Breast Biospy Oophorectomy  Family History: Last updated: 10/29/2006 Mother/Father premature deaths. Father was a  alcoholic. GM was a alcoholic  Social History: Last updated: 10/29/2006 Retired Single Former Smoker - quit 85yrs ago Alcohol use-no Regular exercise-yes  Risk Factors: Smoking Status: quit (10/26/2009)  Family History: Reviewed history from 10/29/2006 and no changes required. Mother/Father premature deaths. Father was a alcoholic. GM was a alcoholic  Social History: Reviewed history from 10/29/2006 and no changes required. Retired Single Former Smoker - quit 58yrs ago Alcohol use-no Regular exercise-yes Smoking Status:  quit  Review of Systems  The patient denies fever, decreased hearing, chest pain, syncope, dyspnea on exertion, peripheral edema, prolonged cough, headaches, hemoptysis, and abdominal pain.    Physical Exam  General:  alert, well-developed, well-nourished, well-hydrated, and  overweight-appearing.   Head:  normocephalic.   Nose:  no nasal discharge.   Mouth:  pharynx pink and moist.   Neck:  supple.   Lungs:  normal respiratory effort, normal breath sounds, no crackles, and no wheezes.   Heart:  normal rate, no murmur, no gallop, no JVD, and irregular rhythm.   Abdomen:  soft, non-tender, normal bowel sounds, and no distention.   Msk:  normal ROM, no joint tenderness, no joint swelling, and no joint warmth.   Pulses:  2+ Extremities:  trace left pedal edema and trace right pedal edema.   Neurologic:  alert & oriented X3, cranial nerves II-XII intact, strength normal in all extremities, sensation intact to light touch, and gait normal.     Impression & Recommendations:  Problem # 1:  DIABETES MELLITUS, TYPE II (ICD-250.00) Assessment Unchanged A1C well controlled and continue the current regimen. No episodes of hypoglycemia. Her updated medication list for this problem includes:    Metformin Hcl 500 Mg Tabs (Metformin hcl) .Marland Kitchen..Marland Kitchen Two tabs by mouth twice daily    Zestril 40 Mg Tabs (Lisinopril) .Marland Kitchen... Take 1 tablet by mouth once a day    Amaryl 4  Mg Tabs (Glimepiride) .Marland Kitchen... Take one (1) by mouth once a day    Prandin 2 Mg Tabs (Repaglinide) .Marland Kitchen... 1tab po three times a day by mouth tab ac as directed  Orders: Capillary Blood Glucose/CBG (16109)  Labs Reviewed: Creat: 0.57 (10/18/2009)     Last Eye Exam: No diabetic retinopathy.   Glaucoma suspected.  (10/03/2007) Reviewed HgBA1c results: 7.4 (in hospital) (08/07/2009)  7.5 (02/22/2009)  Problem # 2:  HYPERTENSION (ICD-401.9) Assessment: Improved Better than before, will recheck BP at next visit. Advised weight loss and exercise, which will help her DM, HTN and HLD. She understands that.   Her updated medication list for this problem includes:    Zestril 40 Mg Tabs (Lisinopril) .Marland Kitchen... Take 1 tablet by mouth once a day  BP today: 146/69 Prior BP: 152/76 (10/18/2009)  Prior 10 Yr Risk Heart Disease: 24 % (03/21/2009)  Labs Reviewed: K+: 3.9 (10/18/2009) Creat: : 0.57 (10/18/2009)   Chol: 200 (04/17/2009)   HDL: 55 (04/17/2009)   LDL: 127 (04/17/2009)   TG: 92 (04/17/2009)  Problem # 3:  Hx of GERD (ICD-530.81) Assessment: Unchanged She has intermittent wheezing and cough, may be due to GERD or ACEIs or URI. Asked her to try omeprazole (OTC) for 2 weeks, can stop it if no improvement.    Problem # 4:  ARRHYTHMIA, HX OF (ICD-V12.50) Assessment: Unchanged She has about 20 yrs Hx of irregular HR and done full work-up, unremarkable. Her recent TSH in low normal limit, will recheck TSH and free T4. If normal, then just monitor.   Complete Medication List: 1)  Metformin Hcl 500 Mg Tabs (Metformin hcl) .... Two tabs by mouth twice daily 2)  Zestril 40 Mg Tabs (Lisinopril) .... Take 1 tablet by mouth once a day 3)  Calcium 600/vitamin D 600-400 Mg-unit Tabs (Calcium carbonate-vitamin d) .... Take 1 tablet by mouth two times a day 4)  Bd Ultra-fine Pen Needles 29g X 12.30mm Misc (Insulin pen needle) .... Use as directed with solostar pen 5)  Amaryl 4 Mg Tabs (Glimepiride) ....  Take one (1) by mouth once a day 6)  Prandin 2 Mg Tabs (Repaglinide) .Marland Kitchen.. 1tab po three times a day by mouth tab ac as directed 7)  Voltaren 1 % Gel (Diclofenac sodium) .... Apply two times a day as  directed to knees for pain 8)  Table- Over The Bed Table(rolls) Adjustable Height  .... Dx: arthritis 9)  Simvastatin 20 Mg Tabs (Simvastatin) .... Take 1 tablet by mouth once a day 10)  Vicodin 5-500 Mg Tabs (Hydrocodone-acetaminophen) .... Take 1-2 tablets by mouth every 6 hours as needed for pain 11)  Robaxin 500 Mg Tabs (Methocarbamol) .... Take one tab by mouth every 6 hours as needed for muscle spasm 12)  Cleocin 150 Mg Caps (Clindamycin hcl) .... Take 1 tablet by mouth two times a day 13)  Vicodin 5-500 Mg Tabs (Hydrocodone-acetaminophen) .... Take 1 tablet by mouth every 6 hours  Other Orders: T-TSH ([id number]) T-T4, Free [phone])  Patient Instructions: 1)  Please schedule a follow-up appointment in 4 months. 2)  It is important that you exercise regularly at least 20 minutes 5 times a week. If you develop chest pain, have severe difficulty breathing, or feel very tired , stop exercising immediately and seek medical attention. 3)  You need to lose weight. Consider a lower calorie diet and regular exercise.  4)  Check your blood sugars regularly. If your readings are usually above : or below 70 you should contact our office. 5)  Check your feet each night for sore areas, calluses or signs of infection.  Prevention & Chronic Care Immunizations   Influenza vaccine: Not documented   Influenza vaccine deferral: Refused  (08/27/2009)    Tetanus booster: Not documented   Td booster deferral: Deferred  (08/27/2009)    Pneumococcal vaccine: Not documented   Pneumococcal vaccine deferral: Not indicated  (08/27/2009)    H. zoster vaccine: Not documented   H. zoster vaccine deferral: Deferred  (08/27/2009)  Colorectal Screening   Hemoccult: Not documented   Hemoccult  action/deferral: Not indicated  (04/16/2009)    Colonoscopy: Normal  (07/28/2006)   Colonoscopy due: 01/2016  Other Screening   Pap smear: Not documented   Pap smear action/deferral: Not indicated S/P hysterectomy  (08/27/2009)    Mammogram: ASSESSMENT: Negative - BI-RADS 1^MM DIGITAL SCREENING  (08/20/2009)   Mammogram action/deferral: Deferred  (08/27/2009)   Mammogram due: 08/2007    DXA bone density scan: Not documented   DXA bone density action/deferral: Deferred  (08/27/2009)   Smoking status: quit  (10/26/2009)  Diabetes Mellitus   HgbA1C: 7.4 (in hospital)  (08/07/2009)   HgbA1C action/deferral: Deferred  (08/27/2009)    Eye exam: No diabetic retinopathy.   Glaucoma suspected.   (10/03/2007)   Diabetic eye exam action/deferral: Ophthalmology referral  (10/18/2009)    Foot exam: yes  (05/01/2008)   Foot exam action/deferral: Do today   High risk foot: Not documented   Foot care education: Not documented   Foot exam due: 10/18/2008    Urine microalbumin/creatinine ratio: 3.7  (04/17/2009)    Diabetes flowsheet reviewed?: Yes   Progress toward A1C goal: Unchanged  Lipids   Total Cholesterol: 200  (04/17/2009)   LDL: 127  (04/17/2009)   LDL Direct: Not documented   HDL: 55  (04/17/2009)   Triglycerides: 92  (04/17/2009)    SGOT (AST): 18  (10/18/2009)   SGPT (ALT): 30  (10/18/2009)   Alkaline phosphatase: 78  (10/18/2009)   Total bilirubin: 0.9  (10/18/2009)    Lipid flowsheet reviewed?: Yes   Progress toward LDL goal: Unchanged  Hypertension   Last Blood Pressure: 146 / 69  (10/26/2009)   Serum creatinine: 0.57  (10/18/2009)   BMP action: Deferred   Serum potassium 3.9  (10/18/2009)    Hypertension  flowsheet reviewed?: Yes   Progress toward BP goal: Unchanged  Self-Management Support :   Personal Goals (by the next clinic visit) :     Personal A1C goal: 7  (03/21/2009)     Personal blood pressure goal: 140/90  (03/21/2009)     Personal LDL goal:  100  (03/21/2009)    Patient will work on the following items until the next clinic visit to reach self-care goals:     Medications and monitoring: take my medicines every day, bring all of my medications to every visit, examine my feet every day  (10/26/2009)     Eating: drink diet soda or water instead of juice or soda, eat more vegetables, use fresh or frozen vegetables, eat foods that are low in salt, eat baked foods instead of fried foods, eat fruit for snacks and desserts  (10/26/2009)     Activity: take a 30 minute walk every day  (10/26/2009)     Other: congratulations on your weight loss, continue the good work  (04/16/2009)    Diabetes self-management support: Written self-care plan  (10/26/2009)   Diabetes care plan printed   Last diabetes self-management training by diabetes educator: 12/02/2007   Last medical nutrition therapy: 05/01/2008    Hypertension self-management support: Written self-care plan  (10/26/2009)   Hypertension self-care plan printed.    Lipid self-management support: Written self-care plan  (10/26/2009)   Lipid self-care plan printed.  Process Orders Check Orders Results:     [facility]: Check successful Tests Sent for requisitioning (October 26, 2009 5:44 PM):     10/26/2009: [facility] -- T-TSH [phone] (signed)     10/26/2009: [facility] -- T-T4, [place] [[id number]] (signed)    Process Orders Check Orders Results:     [facility]: Check successful Tests Sent for requisitioning (October 26, 2009 5:44 PM):     10/26/2009: [facility] -- T-TSH [phone] (signed)     10/26/2009: [facility] -- [place], [place] [[id number]] (signed)

## 2010-07-30 NOTE — Assessment & Plan Note (Signed)
Summary: [facility] PT RT. KNEE EFFUSION NEEDS DRAIN/POSS INJECTION   Vital Signs:  Patient profile:   70 year old female BP sitting:   141 / 71  Vitals Entered By: Lillia Pauls CMA (December 17, 2009 2:55 PM)  Primary Care Provider:  [name]  DO   History of Present Illness: right knee pain many years had scope 10 y ago last few months pain is so bad she can do only a little walking some days pain 9/10 ,others 4-6/10. aches worse with weight bearing. no recent falls she is in weight  loss program, anticipating lap band surgery. not able to walk for exercise because of knee pain  Allergies: 1)  ! Pcn 2)  ! Demerol  Past History:  Past Medical History: Last updated: 07/11/2008 Diabetes mellitus, type II Hyperlipidemia Hypertension Gout  Past Surgical History: Last updated: 09/14/2009 Hysterectomy-1982-D/T fibroids Knee arthroscopy Laparoscopy D and C Breast Biospy Oophorectomy  Social History: Last updated: 10/29/2006 Retired Single Former Smoker - quit 13yrs ago Alcohol use-no Regular exercise-yes  Review of Systems       see hpi  Physical Exam  General:  overweight-appearing.   Msk:  Right knee: medial joint line tenderness. No effusion. Ligamentously intact. Normal anterior draw3er. McMurray not attempted.  LEG: calf is soft. distally neurovascurlarly intact.  QUADS: poor VMO development B.  GAIT antalgic and a bit broad based (secondary to habitus)   Additional Exam:  XRAYS reviewed--they are non weight bearing films--medial compartment is bone on bone. Some shift of femur on tibia patellofemoral compartment arthritis  Patient given informed consent for injection. Discussed possible complications of infection, bleeding or skin atrophy at site of injection. Possible side effect of avascular necrosis (focal area of bone death) due to steroid use.Appropriate verbal time out taken Are cleaned and prepped in usual sterile fashion. A ---1- cc kennalog plus  --4--cc 1% lidocaine without epinephrine was injected into the--right knee using anterior approach-. Patient tolerated procedure well with no complications.    Impression & Recommendations:  Problem # 1:  KNEE PAIN, RIGHT (ICD-719.46) severe DJD knee we discussed weight loss---she has lost 12 pounds I gave her some upper body exercises to help her with weight loss will try injection tehrapy today--can do q 3 months. if this is not helping, he maybe looking at TKR. For tthat, as much weight loss as she could attain would be beneficial. Also discussed short arc quads for leg strengthening  Complete Medication List: 1)  Metformin Hcl 500 Mg Tabs (Metformin hcl) .... Two tabs by mouth twice daily 2)  Zestril 40 Mg Tabs (Lisinopril) .... Take 1 tablet by mouth once a day 3)  Calcium 600/vitamin D 600-400 Mg-unit Tabs (Calcium carbonate-vitamin d) .... Take 1 tablet by mouth two times a day 4)  Bd Ultra-fine Pen Needles 29g X 12.13mm Misc (Insulin pen needle) .... Use as directed with solostar pen 5)  Amaryl 4 Mg Tabs (Glimepiride) .... Take one (1) by mouth once a day 6)  Prandin 2 Mg Tabs (Repaglinide) .Marland Kitchen.. 1tab po three times a day by mouth tab ac as directed 7)  Voltaren 1 % Gel (Diclofenac sodium) .... Apply two times a day as directed to knees for pain 8)  Table- Over The Bed Table(rolls) Adjustable Height  .... Dx: arthritis 9)  Simvastatin 20 Mg Tabs (Simvastatin) .... Take 1 tablet by mouth once a day 10)  Vicodin 5-500 Mg Tabs (Hydrocodone-acetaminophen) .... Take 1-2 tablets by mouth every 6 hours as needed  for pain 11)  Robaxin 500 Mg Tabs (Methocarbamol) .... Take one tab by mouth every 6 hours as needed for muscle spasm 12)  Vicodin 5-500 Mg Tabs (Hydrocodone-acetaminophen) .... Take 1 tablet by mouth every 6 hours 13)  Hydrochlorothiazide 25 Mg Tabs (Hydrochlorothiazide) .... Take 1 tablet by mouth once a day  Other Orders: Joint Aspirate / Injection, Large ([id number]) Kenalog 10 mg  inj ([id number])

## 2010-07-30 NOTE — Assessment & Plan Note (Signed)
Summary: checkup/pcp-golding/hla   Vital Signs:  Patient profile:   70 year old female Height:      62.5 inches (158.75 cm) Weight:      253.04 pounds (115.02 kg) BMI:     45.71 Temp:     98 degrees F (36.67 degrees C) oral Pulse rate:   79 / minute BP sitting:   135 / 69  (right arm) Cuff size:   large  Vitals Entered By: Angelina Ok RN (April 11, 2010 1:31 PM) CC: Depression Is Patient Diabetic? Yes Did you bring your meter with you today? Yes Pain Assessment Patient in pain? no      Nutritional Status BMI of > 30 = obese CBG Result 152  Have you ever been in a relationship where you felt threatened, hurt or afraid?No   Does patient need assistance? Functional Status Self care Ambulation Normal Comments Check up.  Needs results of labs.   Diabetic Foot Exam Foot Inspection Is there a history of a foot ulcer?              No Is there a foot ulcer now?              No Can the patient see the bottom of their feet?          Yes Are the shoes appropriate in style and fit?          Yes Is there swelling or an abnormal foot shape?          No Are the toenails long?                No Are the toenails thick?                No Are the toenails ingrown?              No Is there heavy callous build-up?              No Is there pain in the calf muscle (Intermittent claudication) when walking?    NoIs there a claw toe deformity?              No Is there elevated skin temperature?            No Is there limited ankle dorsiflexion?            No Is there foot or ankle muscle weakness?            No  Diabetic Foot Care Education Pulse Check          Right Foot          Left Foot Posterior Tibial:        normal            normal Dorsalis Pedis:        normal            normal Comments: Small amoutnt of callus build up on heel of left foot and small toe on rifgt foot. High Risk Feet? No   10-g (5.07) Semmes-Weinstein Monofilament Test Performed by: Angelina Ok RN     Right Foot          Left Foot Visual Inspection               Test Control      normal         normal Site 1         normal  normal Site 2         normal         normal Site 3         normal         normal Site 4         normal         normal Site 5         normal         normal Site 6         normal         normal Site 7         normal         normal Site 8         normal         normal Site 9         normal         normal Site 10         normal         normal  Impression      normal         normal   Primary Care Provider:  [name]  DO  CC:  Depression.  History of Present Illness: Kaiya comes in today for routine follow-up. She needs med refills and labwork. She complains of worsening bilateral knee pain r>L. 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  Depression History:      The patient denies a depressed mood most of the day and a diminished interest in her usual daily activities.        The patient denies that she feels like life is not worth living, denies that she wishes that she were dead, and denies that she has thought about ending her life.         Preventive Screening-Counseling & Management  Alcohol-Tobacco     Alcohol drinks/day: 0     Smoking Status: quit     Year Quit: 1990  Current Medications (verified): 1)  Metformin Hcl 500 Mg Tabs (Metformin Hcl) .... Two Tabs By Mouth Twice Daily 2)  Zestril 40 Mg Tabs (Lisinopril) .... Take 1 Tablet By Mouth Once A Day 3)  Calcium 600/vitamin D 600-400 Mg-Unit Tabs (Calcium Carbonate-Vitamin D) .... Take 1 Tablet By Mouth Two Times A Day 4)  Bd Ultra-Fine Pen Needles 29g X 12.61mm Misc (Insulin Pen Needle) .... Use As Directed With Solostar Pen 5)  Amaryl 4 Mg Tabs (Glimepiride) .... Take One (1) By Mouth Once A Day 6)  Prandin 2 Mg Tabs (Repaglinide) .Marland Kitchen.. 1tab Po Three Times A Day By Mouth Tab Ac As Directed 7)  Voltaren 1 % Gel (Diclofenac  Sodium) .... Apply Two Times A Day As Directed To Knees For Pain 8)  Simvastatin 20 Mg Tabs (Simvastatin) .... Take 1 Tablet By Mouth Once A Day 9)  Hydrochlorothiazide 25 Mg Tabs (Hydrochlorothiazide) .... Take 1 Tablet By Mouth Once A Day 10)  Prodigy Blood Glucose Test  Strp (Glucose Blood) .... 90 Day Supply 250.00 Use As Directed 3 Times Daily  Allergies (verified): 1)  ! Pcn 2)  ! Demerol  Review of Systems      See HPI  Physical Exam  General:  overweight-appearing.   Lungs:  normal respiratory effort, no accessory muscle use, and normal breath sounds.   Heart:  normal rate, regular rhythm, and no murmur.   Abdomen:  soft,  non-tender, normal bowel sounds, and no distention.   Msk:  Bilateral knee pain on ROM and joint line palpation- no significant effusions or swelling inflammation.   Extremities:  trace left pedal edema and trace right pedal edema.   Skin:  no rashes and no suspicious lesions.   Psych:  Oriented X3, normally interactive, and good eye contact.    Diabetes Management Exam:    Foot Exam (with socks and/or shoes not present):       Sensory-Monofilament:          Left foot: normal          Right foot: normal   Impression & Recommendations:  Problem # 1:  KNEE PAIN, RIGHT (ICD-719.46) Patient has R>L knee tenderness. She may have small effusion on right. May benefit from joint injection. I will refer her to [facility] for evaluation. Vicodin for acute pain only at bedtime.   The following medications were removed from the medication list:    Vicodin 5-500 Mg Tabs (Hydrocodone-acetaminophen) .Marland Kitchen... Take 1-2 tablets by mouth every 6 hours as needed for pain    Robaxin 500 Mg Tabs (Methocarbamol) .Marland Kitchen... Take one tab by mouth every 6 hours as needed for muscle spasm    Vicodin 5-500 Mg Tabs (Hydrocodone-acetaminophen) .Marland Kitchen... 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 Her updated  medication list for this problem includes:    Hydrochlorothiazide 25 Mg Tabs (Hydrochlorothiazide) .Marland Kitchen... Take 1 tablet by mouth once a day  Problem # 3:  DIABETES MELLITUS, TYPE II (ICD-250.00) Patient has not dione well on insulin in the past. Better with oral meds. Will do a trial of Prandin. Repeat A1C today. F/U with Jamison Neighbor at 3 month mark and repeat an A1C.  Her updated medication list for this problem includes:    Metformin Hcl 500 Mg Tabs (Metformin hcl) .Marland Kitchen..Marland Kitchen Two tabs by mouth twice daily    Zestril 40 Mg Tabs (Lisinopril) .Marland Kitchen... Take 1 tablet by mouth once a day    Amaryl 4 Mg Tabs (Glimepiride) .Marland Kitchen... Take one (1) by mouth once a day    Prandin 2 Mg Tabs (Repaglinide) .Marland Kitchen... 1tab po three times a day by mouth tab ac as directed  Orders: Capillary Blood Glucose/CBG ([address])  Will check a1c today trend has been down.  needs her yearly eye exam. Rec daily asa.  Labs Reviewed: Creat: 0.67 (01/03/2010)     Last Eye Exam: No diabetic retinopathy.   Glaucoma suspected.  (10/03/2007) Reviewed HgBA1c results: 7.9 (01/03/2010)  7.7 (12/13/2009)  Complete Medication List: 1)  Metformin Hcl 500 Mg Tabs (Metformin hcl) .... Two tabs by mouth twice daily 2)  Zestril 40 Mg Tabs (Lisinopril) .... Take 1 tablet by mouth once a day 3)  Calcium 600/vitamin D 600-400 Mg-unit Tabs (Calcium carbonate-vitamin d) .... Take 1 tablet by mouth two times a day 4)  Amaryl 4 Mg Tabs (Glimepiride) .... Take one (1) by mouth once a day 5)  Prandin 2 Mg Tabs (Repaglinide) .Marland Kitchen.. 1tab po three times a day by mouth tab ac as directed 6)  Voltaren 1 % Gel (Diclofenac sodium) .... Apply two times a day as directed to knees for pain 7)  Simvastatin 20 Mg Tabs (Simvastatin) .... Take 1 tablet by mouth once a day 8)  Hydrochlorothiazide 25 Mg Tabs (Hydrochlorothiazide) .... Take 1 tablet by mouth once a day 9)  Prodigy Blood Glucose Test Strp (Glucose  blood) .... 90 day supply 250.00 use as directed 3 times  daily  Patient Instructions: 1)  Please schedule a follow-up appointment in 6 months.   Vital Signs:  Patient profile:   70 year old female Height:      62.5 inches (158.75 cm) Weight:      253.04 pounds (115.02 kg) BMI:     45.71 Temp:     98 degrees F (36.67 degrees C) oral Pulse rate:   79 / minute BP sitting:   135 / 69  (right arm) Cuff size:   large  Vitals Entered By: Angelina Ok RN (April 11, 2010 1:31 PM)    Prevention & Chronic Care Immunizations   Influenza vaccine: Not documented   Influenza vaccine deferral: Refused  (08/27/2009)    Tetanus booster: Not documented   Td booster deferral: Deferred  (08/27/2009)    Pneumococcal vaccine: Not documented   Pneumococcal vaccine deferral: Not indicated  (08/27/2009)    H. zoster vaccine: Not documented   H. zoster vaccine deferral: Deferred  (08/27/2009)  Colorectal Screening   Hemoccult: Not documented   Hemoccult action/deferral: Not indicated  (04/16/2009)    Colonoscopy: Normal  (07/28/2006)   Colonoscopy due: 01/2016  Other Screening   Pap smear: Not documented   Pap smear action/deferral: Not indicated S/P hysterectomy  (08/27/2009)    Mammogram: ASSESSMENT: Negative - BI-RADS 1^MM DIGITAL SCREENING  (08/20/2009)   Mammogram action/deferral: Deferred  (08/27/2009)   Mammogram due: 08/2007    DXA bone density scan: Not documented   DXA bone density action/deferral: Deferred  (08/27/2009)   Smoking status: quit  (04/11/2010)  Diabetes Mellitus   HgbA1C: 7.9  (01/03/2010)   HgbA1C action/deferral: Deferred  (08/27/2009)    Eye exam: No diabetic retinopathy.   Glaucoma suspected.   (10/03/2007)   Diabetic eye exam action/deferral: Ophthalmology referral  (10/18/2009)    Foot exam: yes  (04/11/2010)   Foot exam action/deferral: Do today   High risk foot: No  (04/11/2010)   Foot care education: Not documented   Foot exam due: 10/18/2008    Urine microalbumin/creatinine ratio: 3.7   (04/17/2009)  Lipids   Total Cholesterol: 202  (01/09/2010)   LDL: 118  (01/09/2010)   LDL Direct: Not documented   HDL: 62  (01/09/2010)   Triglycerides: 112  (01/09/2010)    SGOT (AST): 18  (10/18/2009)   SGPT (ALT): 30  (10/18/2009)   Alkaline phosphatase: 78  (10/18/2009)   Total bilirubin: 0.9  (10/18/2009)  Hypertension   Last Blood Pressure: 135 / 69  (04/11/2010)   Serum creatinine: 0.67  (01/03/2010)   BMP action: Deferred   Serum potassium 4.5  (01/03/2010)  Self-Management Support :   Personal Goals (by the next clinic visit) :     Personal A1C goal: 7  (03/21/2009)     Personal blood pressure goal: 140/90  (03/21/2009)     Personal LDL goal: 100  (03/21/2009)    Patient will work on the following items until the next clinic visit to reach self-care goals:     Medications and monitoring: take my medicines every day, check my blood sugar, bring all of my medications to every visit, examine my feet every day  (04/11/2010)     Eating: drink diet soda or water instead of juice or soda, eat more vegetables, use fresh or frozen vegetables, eat foods that are low in salt, eat baked foods instead of fried foods, eat fruit for snacks and desserts, limit or avoid  alcohol  (04/11/2010)     Activity: take a 30 minute walk every day  (04/11/2010)     Other: congratulations on your weight loss, continue the good work  (04/16/2009)    Diabetes self-management support: Written self-care plan, Education handout, Pre-printed educational material, Resources for patients handout  (04/11/2010)   Diabetes care plan printed   Diabetes education handout printed   Last diabetes self-management training by diabetes educator: 12/02/2007   Last medical nutrition therapy: 05/01/2008    Hypertension self-management support: Written self-care plan, Education handout, Pre-printed educational material, Resources for patients handout  (04/11/2010)   Hypertension self-care plan printed.    Hypertension education handout printed    Lipid self-management support: Written self-care plan, Education handout, Pre-printed educational material, Resources for patients handout  (04/11/2010)   Lipid self-care plan printed.   Lipid education handout printed      Resource handout printed.   Nursing Instructions: Diabetic foot exam today     Last LDL:                                                 118 (01/09/2010 9:36:00 PM)        Diabetic Foot Exam Pulse Check          Right Foot          Left Foot Posterior Tibial:        normal            normal Dorsalis Pedis:        normal            normal  High Risk Feet? No   10-g (5.07) Semmes-Weinstein Monofilament Test Performed by: Angelina Ok RN          Right Foot          Left Foot Visual Inspection

## 2010-07-30 NOTE — Progress Notes (Signed)
  Phone Note Call from Patient   Summary of Call: pt c/o stiff neck x 1 week and swollen ankles x 2-3 days. recently had tooth extraction but otherwise nothing has changed. no other complaints. nothing alleviates, nothing aggravates.   Follow-up for Phone Call        patient seen. Follow-up by: [name]  DO,  October 18, 2009 9:15 AM

## 2010-07-30 NOTE — Letter (Signed)
Summary: [facility]   Imported By: Margie Billet 11/15/2009 12:01:24  _____________________________________________________________________  External Attachment:    Type:   Image     Comment:   External Document

## 2010-07-30 NOTE — Progress Notes (Signed)
Summary: refill/ hla  Phone Note Refill Request Message from:  Patient on February 05, 2010 10:28 AM  Refills Requested: Medication #1:  prodigy test strips, 90 day supply   Dosage confirmed as above?Dosage Confirmed  Medication #2:  lancets, 90 day supply   Dosage confirmed as above?Dosage Confirmed Initial call taken by: Marin Roberts RN,  February 05, 2010 10:29 AM  Follow-up for Phone Call        Refill approved-nurse to complete Follow-up by: [name]  DO,  February 06, 2010 2:32 PM  Additional Follow-up for Phone Call Additional follow up Details #1::       Additional Follow-up by: [name]  DO,  February 06, 2010 2:32 PM    New/Updated Medications: PRODIGY BLOOD GLUCOSE TEST  STRP (GLUCOSE BLOOD) 90 Day supply 250.00 use as directed 3 times daily Prescriptions: PRODIGY BLOOD GLUCOSE TEST  STRP (GLUCOSE BLOOD) 90 Day supply 250.00 use as directed 3 times daily  #90 days x 0   Entered and Authorized by:   [name]  DO   Signed by:   [name]  DO on 02/06/2010   Method used:   Electronically to        [facility] (retail)       [address]       [place], [place]  [id number]       Ph: [phone]       Fax: [phone]   RxID:   [id number]

## 2010-07-30 NOTE — Miscellaneous (Signed)
Summary: CPAP settings  Type of Equipment:  S9 Elite # [id number] Pressure:  13 cm H2O Mask:  Quattro FX Small Full Face Humidifier:  Heated # [id number] Other Supplies:  Climate control tubing

## 2010-07-30 NOTE — Progress Notes (Signed)
Summary: tooth pain/ hla  Phone Note Call from Patient   Summary of Call: pt states she spoke w/ you about tooth pain, it is worse she would like something for it...abx and pain med...[facility]... pt's ph# [phone] Initial call taken by: Marin Roberts RN,  October 22, 2009 4:38 PM  Follow-up for Phone Call        Please call in Clindamycin and Vicodin-thanks. I updated med list. her hemoglobin was also low on her last visit- I need to repeat that toward teh end of this week if possible- woudl you let her know and make her a lab appt? I will put in the order. THANKS!!!!!!!!!!! Follow-up by: [name]  DO,  October 23, 2009 1:56 PM  Additional Follow-up for Phone Call Additional follow up Details #1::        Phone Call Completed, Rx Called In Additional Follow-up by: Marin Roberts RN,  October 23, 2009 4:13 PM  New Problems: ANEMIA, MILD (ICD-285.9)   New Problems: ANEMIA, MILD (ICD-285.9) New/Updated Medications: CLEOCIN 150 MG CAPS (CLINDAMYCIN HCL) Take 1 tablet by mouth two times a day VICODIN 5-500 MG TABS (HYDROCODONE-ACETAMINOPHEN) Take 1 tablet by mouth every 6 hours Prescriptions: VICODIN 5-500 MG TABS (HYDROCODONE-ACETAMINOPHEN) Take 1 tablet by mouth every 6 hours  #30 x 0   Entered and Authorized by:   [name]  DO   Signed by:   [name]  DO on 10/23/2009   Method used:   Telephoned to ...       [facility] (retail)       [address]       [place], [place]  [id number]       Ph: [phone]       Fax: [phone]   RxID:   [id number] CLEOCIN 150 MG CAPS (CLINDAMYCIN HCL) Take 1 tablet by mouth two times a day  #14 x 0   Entered and Authorized by:   [name]  DO   Signed by:   [name]  DO on 10/23/2009   Method used:   Electronically to        [facility] (retail)       [address]       [place], [place]  [id number]       Ph: [phone]       Fax: [phone]   RxID:    [id number]

## 2010-07-30 NOTE — Assessment & Plan Note (Signed)
Summary: FU VISIT/DR. GOLDING TO SEE PER DR. GOLDING/DS   Vital Signs:  Patient profile:   70 year old female Height:      62.5 inches (158.75 cm) Weight:      247.5 pounds (112.50 kg) BMI:     44.71 Temp:     99.5 degrees F (37.50 degrees C) oral Pulse rate:   65 / minute BP sitting:   128 / 65  (right arm) Cuff size:   large  Vitals Entered By: Krystal Eaton Duncan Dull) (August 27, 2009 8:31 AM) CC: routine f/u, discuss surgical weight loss options Is Patient Diabetic? Yes Did you bring your meter with you today? No Pain Assessment Patient in pain? no      Nutritional Status BMI of > 30 = obese  Have you ever been in a relationship where you felt threatened, hurt or afraid?No   Does patient need assistance? Functional Status Self care Ambulation Normal   Diabetic Foot Exam    10-g (5.07) Semmes-Weinstein Monofilament Test Performed by: Toney Rakes  Primary Care Provider:  [name]  DO  CC:  routine f/u and discuss surgical weight loss options.  History of Present Illness: 70 year old female with pMH as described in EMR is here today to meet Dr Phillips Odor to discuss about lap banding for weight loss. No other complaints at this time.  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) 7)  Gout, Unspecified  (ICD-274.9) 8)  Foot Pain, Left  (ICD-729.5) 9)  Diarrhea, Recurrent  (ICD-787.91) 10)  Pruritus  (ICD-698.9) 11)  Health Maintenance Exam  (ICD-V70.0) 12)  Hx of Gerd  (ICD-530.81) 13)  Hx of Osteoarthrosis, Generalized, Multiple Sites  (ICD-715.09) 14)  Hx of Hearing Loss, Bilateral  (ICD-389.9) 15)   Hysterectomy, Hx of  (ICD-V45.77) 16)  Diabetes Mellitus, Type II  (ICD-250.00) 17)  Hypertension  (ICD-401.9) 18)  Hyperlipidemia  (ICD-272.4) 19)  Obesity  (ICD-278.00) 20)  Sleep Apnea, Obstructive  (ICD-327.23) 21)  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 Two Tabs By Mouth Twice Daily 2)  Zestril 40 Mg Tabs (Lisinopril) .... Take 1 Tablet By Mouth Once A Day 3)  Calcium 600/vitamin D 600-400 Mg-Unit Tabs (Calcium Carbonate-Vitamin D) .... Take 1 Tablet By Mouth Two Times A Day 4)  Bd Ultra-Fine Pen Needles 29g X 12.43mm Misc (Insulin Pen Needle) .... Use As Directed With Solostar Pen 5)  Amaryl 4 Mg Tabs (Glimepiride) .... Take One (1) By Mouth Once A Day 6)  Prandin 2 Mg Tabs (Repaglinide) .Marland Kitchen.. 1tab Po Three Times A Day By Mouth Tab Ac As Directed 7)  Advil Migraine 200 Mg Caps (Ibuprofen) .... Take 1 Pill Every 6 Hours As Needed For Pain 8)  Voltaren 1 % Gel (Diclofenac Sodium) .... Apply Two Times A Day As Directed To Knees For Pain 9)  Table- Over The Bed Table(Rolls) Adjustable Height .... Dx: Arthritis 10)  Darvocet-N 50 50-325 Mg Tabs (Propoxyphene N-Apap) .... Take 1-2  Tablets By Mouth  Every 6 Hours As Neede For Pain  Current Medications (verified): 1)  Metformin Hcl 500 Mg Tabs (Metformin Hcl) .... Two Tabs By Mouth Twice Daily 2)  Zestril 40 Mg Tabs (Lisinopril) .... Take 1 Tablet By Mouth Once A Day 3)  Calcium 600/vitamin D 600-400 Mg-Unit Tabs (Calcium Carbonate-Vitamin D) .... Take 1 Tablet By Mouth Two Times A Day 4)  Bd Ultra-Fine Pen Needles 29g X 12.54mm Misc (Insulin Pen Needle) .... Use As Directed With Solostar Pen 5)  Amaryl 4 Mg Tabs (Glimepiride) .... Take One (1) By Mouth Once A Day 6)  Prandin 2 Mg Tabs (Repaglinide) .Marland Kitchen.. 1tab Po Three Times A Day By Mouth Tab Ac As Directed 7)  Advil  Migraine 200 Mg Caps (Ibuprofen) .... Take 1 Pill Every 6 Hours As Needed For Pain 8)  Voltaren 1 % Gel (Diclofenac Sodium) .... Apply Two Times A Day As Directed To Knees For Pain 9)  Table- Over The Bed Table(Rolls) Adjustable Height .... Dx: Arthritis 10)  Darvocet-N 50 50-325 Mg Tabs (Propoxyphene N-Apap) .... Take 1-2  Tablets By Mouth Every 6 Hours As Neede For Pain 11)  Pravastatin Sodium 20 Mg Tabs (Pravastatin Sodium) .... Take 1 Tablet By Mouth Once A Day  Allergies: 1)  ! Pcn 2)  ! Demerol  Past History:  Past Medical History: Last updated: 07/11/2008 Diabetes mellitus, type II Hyperlipidemia Hypertension Gout  Past Surgical History: Last updated: 03/27/2009 Hysterectomy-1982-D/T fibroids Knee arthroscopy Laparoscopy D and C Breast Biospy  Family History: Last updated: 10/29/2006 Mother/Father premature deaths. Father was a alcoholic. GM was a alcoholic  Social History: Last updated: 10/29/2006 Retired Single Former Smoker - quit 69yrs ago Alcohol use-no Regular exercise-yes  Risk Factors: Exercise: yes (10/29/2006)  Risk Factors: Smoking Status: never (08/27/2009)  Review of Systems      See HPI  Physical Exam  Additional Exam:  Gen: AOx3, in no acute distress Eyes: PERRL, EOMI ENT:MMM, No erythema noted in posterior pharynx Neck: No JVD, No LAP Chest: CTAB with  good respiratory effort CVS: regular rhythmic rate, NO M/R/G, S1 S2 normal Abdo: soft,ND, BS+x4, Non tender and No hepatosplenomegaly EXT: No odema noted Neuro: Non focal, gait is normal Skin: no rashes noted.    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 No abdominal pain or any signs of infections.  Problem # 2:  DIABETES MELLITUS, TYPE II (ICD-250.00) HBA1c under control. patient needs clearance for gastric lap band. Dr Phillips Odor and I togetherwrote a  letter to the surgeon saying that she ismedically clear for surgery. Patient also need to see Jamison Neighbor for Nutition therapy. Her updated medication list for this problem includes:    Metformin Hcl 500 Mg Tabs (Metformin hcl) .Marland Kitchen..Marland Kitchen Two tabs by mouth twice daily    Zestril 40 Mg Tabs (Lisinopril) .Marland Kitchen... Take 1 tablet by mouth once a day    Amaryl 4 Mg Tabs (Glimepiride) .Marland Kitchen... Take one (1) by mouth once a day    Prandin 2 Mg Tabs (Repaglinide) .Marland Kitchen... 1tab po three times a day by mouth tab ac as directed  Orders: Nutrition Referral (Nutrition)  Labs Reviewed: Creat: 0.63 (07/11/2008)     Last Eye Exam: No diabetic retinopathy.   Glaucoma suspected.  (10/03/2007) Reviewed HgBA1c results: 7.5 (02/22/2009)  8.5 (07/11/2008)  Problem # 3:  HYPERTENSION (ICD-401.9) Well controlled. Her updated medication list for this problem includes:  Zestril 40 Mg Tabs (Lisinopril) .Marland Kitchen... Take 1 tablet by mouth once a day  BP today: 128/65 Prior BP: 137/71 (04/16/2009)  Prior 10 Yr Risk Heart Disease: 24 % (03/21/2009)  Labs Reviewed: K+: 4.3 (07/11/2008) Creat: : 0.63 (07/11/2008)   Chol: 200 (04/17/2009)   HDL: 55 (04/17/2009)   LDL: 127 (04/17/2009)   TG: 92 (04/17/2009)  Problem # 4:  HYPERLIPIDEMIA (ICD-272.4) Will start with Pravastatin for now. The following medications were removed from the medication list:    Pravastatin Sodium 20 Mg Tabs (Pravastatin sodium) .Marland Kitchen... Take 1 tablet by mouth once a day Her updated medication list for this problem includes:    Simvastatin 20 Mg Tabs (Simvastatin) .Marland Kitchen... Take 1 tablet by mouth once a day  Labs Reviewed: SGOT: 10 (07/11/2008)   SGPT: 13 (07/11/2008)  Lipid Goals: Chol Goal: 200 (03/21/2009)   HDL Goal: 40 (03/21/2009)   LDL Goal: 100 (03/21/2009)   TG Goal: 150 (03/21/2009)  Prior 10 Yr Risk Heart Disease: 24 % (03/21/2009)   HDL:55 (04/17/2009), 59 (05/01/2008)  LDL:127 (04/17/2009), 105 (05/01/2008)  Chol:200 (04/17/2009), 186  (05/01/2008)  Trig:92 (04/17/2009), 111 (05/01/2008)  Problem # 5:  Preventive Health Care (ICD-V70.0) Discussed preventive care and vaccines.  Problem # 6:  OBESITY (ICD-278.00) Patient is planning to undergo lap banding in some time. Ht: 62.5 (08/27/2009)   Wt: 247.5 (08/27/2009)   BMI: 44.71 (08/27/2009)  Complete Medication List: 1)  Metformin Hcl 500 Mg Tabs (Metformin hcl) .... Two tabs by mouth twice daily 2)  Zestril 40 Mg Tabs (Lisinopril) .... Take 1 tablet by mouth once a day 3)  Calcium 600/vitamin D 600-400 Mg-unit Tabs (Calcium carbonate-vitamin d) .... Take 1 tablet by mouth two times a day 4)  Bd Ultra-fine Pen Needles 29g X 12.26mm Misc (Insulin pen needle) .... Use as directed with solostar pen 5)  Amaryl 4 Mg Tabs (Glimepiride) .... Take one (1) by mouth once a day 6)  Prandin 2 Mg Tabs (Repaglinide) .Marland Kitchen.. 1tab po three times a day by mouth tab ac as directed 7)  Advil Migraine 200 Mg Caps (Ibuprofen) .... Take 1 pill every 6 hours as needed for pain 8)  Voltaren 1 % Gel (Diclofenac sodium) .... Apply two times a day as directed to knees for pain 9)  Table- Over The Bed Table(rolls) Adjustable Height  .... Dx: arthritis 10)  Darvocet-n 50 50-325 Mg Tabs (Propoxyphene n-apap) .... Take 1-2  tablets by mouth every 6 hours as neede for pain 11)  Simvastatin 20 Mg Tabs (Simvastatin) .... Take 1 tablet by mouth once a day  Patient Instructions: 1)  Please schedule a follow-up appointment in 6 months. 2)  Please schedule a follow-up appointment as needed. 3)  It is important that you exercise regularly at least 20 minutes 5 times a week. If you develop chest pain, have severe difficulty breathing, or feel very tired , stop exercising immediately and seek medical attention. 4)  You need to lose weight. Consider a lower calorie diet and regular exercise.  5)  Check your blood sugars regularly. If your readings are usually above : 200 or below 70 you should contact our office. 6)   It is important that your Diabetic A1c level is checked every 3 months. 7)  See your eye doctor yearly to check for diabetic eye damage. 8)  Check your feet each night for sore areas, calluses or signs of infection. 9)  Check your Blood Pressure regularly. If it is above: 140/90 you  should make an appointment. Prescriptions: SIMVASTATIN 20 MG TABS (SIMVASTATIN) Take 1 tablet by mouth once a day  #30 x 11   Entered and Authorized by:   Lars Mage MD   Signed by:   Lars Mage MD on 08/27/2009   Method used:   Electronically to        [facility]* (retail)       [address]       [place], [place]  [id number]       Ph: [phone]       Fax: [phone]   RxID:   [id number] PRAVASTATIN SODIUM 20 MG TABS (PRAVASTATIN SODIUM) Take 1 tablet by mouth once a day  #30 x 11   Entered and Authorized by:   Lars Mage MD   Signed by:   Lars Mage MD on 08/27/2009   Method used:   Electronically to        [facility]* (retail)       [address]       [place], [place]  [address]       Ph: [phone]       Fax: [phone]   RxID:   [id number]    Prevention & Chronic Care Immunizations   Influenza vaccine: Not documented   Influenza vaccine deferral: Refused  (08/27/2009)    Tetanus booster: Not documented   Td booster deferral: Deferred  (08/27/2009)    Pneumococcal vaccine: Not documented   Pneumococcal vaccine deferral: Not indicated  (08/27/2009)    H. zoster vaccine: Not documented   H. zoster vaccine deferral: Deferred  (08/27/2009)  Colorectal Screening   Hemoccult: Not documented   Hemoccult action/deferral: Not indicated  (04/16/2009)    Colonoscopy: Normal  (07/28/2006)   Colonoscopy due: 01/2016  Other Screening   Pap smear: Not documented   Pap smear action/deferral: Not indicated S/P hysterectomy  (08/27/2009)    Mammogram: ASSESSMENT: Negative - BI-RADS 1^MM DIGITAL SCREENING  (08/17/2008)    Mammogram action/deferral: Deferred  (08/27/2009)   Mammogram due: 08/2007    DXA bone density scan: Not documented   DXA bone density action/deferral: Deferred  (08/27/2009)   Smoking status: never  (08/27/2009)  Diabetes Mellitus   HgbA1C: 7.5  (02/22/2009)   HgbA1C action/deferral: Deferred  (08/27/2009)    Eye exam: No diabetic retinopathy.   Glaucoma suspected.   (10/03/2007)   Diabetic eye exam action/deferral: Ophthalmology referral  (08/27/2009)    Foot exam: yes  (05/01/2008)   Foot exam action/deferral: Do today   High risk foot: Not documented   Foot care education: Not documented   Foot exam due: 10/18/2008    Urine microalbumin/creatinine ratio: 3.7  (04/17/2009)    Diabetes flowsheet reviewed?: Yes   Progress toward A1C goal: At goal  Lipids   Total Cholesterol: 200  (04/17/2009)   LDL: 127  (04/17/2009)   LDL Direct: Not documented   HDL: 55  (04/17/2009)   Triglycerides: 92  (04/17/2009)    SGOT (AST): 10  (07/11/2008)   SGPT (ALT): 13  (07/11/2008)   Alkaline phosphatase: 67  (07/11/2008)   Total bilirubin: 1.0  (07/11/2008)    Lipid flowsheet reviewed?: Yes   Progress toward LDL goal: Unchanged  Hypertension   Last Blood Pressure: 128 / 65  (08/27/2009)   Serum creatinine: 0.63  (07/11/2008)   BMP action: Deferred   Serum potassium 4.3  (07/11/2008)    Hypertension flowsheet reviewed?: Yes   Progress toward BP goal: At goal  Self-Management Support :   Personal Goals (by the next clinic visit) :     Personal A1C goal: 7  (03/21/2009)     Personal blood pressure goal: 140/90  (03/21/2009)     Personal LDL goal: 100  (03/21/2009)    Patient will work on the following items until the next clinic visit to reach self-care goals:     Medications and monitoring: take my medicines every day, check my blood sugar  (08/27/2009)     Eating: drink diet soda or water instead of juice or soda, eat foods that are low in salt, eat baked foods instead of  fried foods  (08/27/2009)     Activity: join a walking program  (08/27/2009)     Other: congratulations on your weight loss, continue the good work  (04/16/2009)    Diabetes self-management support: CBG self-monitoring log, Written self-care plan, Education handout, Pre-printed educational material, Resources for patients handout, Referred for DM self-management training  (08/27/2009)   Diabetes care plan printed   Diabetes education handout printed   Last diabetes self-management training by diabetes educator: 12/02/2007   Last medical nutrition therapy: 05/01/2008   Referred.    Hypertension self-management support: Pre-printed educational material, Resources for patients handout  (08/27/2009)    Lipid self-management support: Pre-printed educational material, Resources for patients handout  (08/27/2009)        Resource handout printed.   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) GOUT, UNSPECIFIED (ICD-274.9) FOOT PAIN, LEFT (ICD-729.5) DIARRHEA, RECURRENT (ICD-787.91) PRURITUS (ICD-698.9) HEALTH MAINTENANCE EXAM (ICD-V70.0) Hx of GERD (ICD-530.81) Hx of OSTEOARTHROSIS, GENERALIZED, MULTIPLE SITES (ICD-715.09) Hx of HEARING LOSS, BILATERAL (ICD-389.9) HYSTERECTOMY, HX OF (ICD-V45.77) DIABETES MELLITUS, TYPE II (ICD-250.00) HYPERTENSION (ICD-401.9) HYPERLIPIDEMIA (ICD-272.4) OBESITY (ICD-278.00) SLEEP APNEA, OBSTRUCTIVE (ICD-327.23) METHICILLIN RESISTANT STAPHYLOCOCCUS AUREUS INFECTION  (ICD-041.19) SYSTOLIC MURMUR (YQM-578.4) HEMORRHOIDS (ICD-455.6)     Management Training Needs:   Follow-up DSMT(2 hours/year)  Monitoring  Complicating Conditions:  HTN

## 2010-07-30 NOTE — Progress Notes (Signed)
Summary: needs cpap  Phone Note Call from Patient Call back at Home Phone [phone]   Caller: Patient Summary of Call: pt had sleep study and is having surgery on 1/10 and needs to bring Cpap with her at the time of surgery.  pls advise. Initial call taken by: De Nurse,  July 03, 2009 10:36 AM  Follow-up for Phone Call        states she is having her ovaries out. Dr. Phillips Odor at outpt is her pcp. she does not have a CPAP machine & the surgeon wants her to bring her machine to surgery? asked her to call her primary about getting the machine also to Dr. Shawnie Pons Follow-up by: Golden Circle RN,  July 03, 2009 10:59 AM    Additional Follow-up for Phone Call Additional follow up Details #2::    [place] in my office has been trying to handle this.  [facility] should have [facility] therapy contact the pt. today. Follow-up by: Tinnie Gens MD,  July 03, 2009 3:38 PM

## 2010-07-30 NOTE — Progress Notes (Signed)
Summary: Refill/gh  Phone Note Refill Request Message from:  Fax from Pharmacy on February 04, 2010 2:58 PM  Refills Requested: Medication #1:  ZESTRIL 40 MG TABS Take 1 tablet by mouth once a day  Method Requested: Electronic Initial call taken by: Angelina Ok RN,  February 04, 2010 2:58 PM  Follow-up for Phone Call        Refill approved-nurse to complete    Prescriptions: ZESTRIL 40 MG TABS (LISINOPRIL) Take 1 tablet by mouth once a day  #30 x 5   Entered and Authorized by:   [name]  DO   Signed by:   [name]  DO on 02/05/2010   Method used:   Electronically to        [facility]* (retail)       [address]       [place], [place]  [id number]       Ph: [phone]       Fax: [phone]   RxID:   [id number]

## 2010-07-30 NOTE — Assessment & Plan Note (Signed)
Summary: tender place on abd after surgery,tcb   Vital Signs:  Patient profile:   70 year old female Height:      62.5 inches Weight:      246 pounds BMI:     44.44 BSA:     2.10 Temp:     98.8 degrees F Pulse rate:   71 / minute BP sitting:   140 / 76  Vitals Entered By: Jone Baseman CMA (September 14, 2009 9:36 AM) CC: sore spot from abd surgery Is Patient Diabetic? No Pain Assessment Patient in pain? yes     Location: stomach Intensity: 2   Primary Care Provider:  [name]  DO  CC:  sore spot from abd surgery.  History of Present Illness: Post op from Ex lap and BSO.  Doing well.  Not wearing her CPAP as directed.   Reports some pain in incisions at umbilicus and pfannenstiel.   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) 7)  Gout, Unspecified  (ICD-274.9) 8)  Foot Pain, Left  (ICD-729.5) 9)  Diarrhea, Recurrent  (ICD-787.91) 10)  Pruritus  (ICD-698.9) 11)  Health Maintenance Exam  (ICD-V70.0) 12)  Hx of Gerd  (ICD-530.81) 13)  Hx of Osteoarthrosis, Generalized, Multiple Sites  (ICD-715.09) 14)  Hx of Hearing Loss, Bilateral  (ICD-389.9) 15)  Hysterectomy, Hx of  (ICD-V45.77) 16)  Diabetes Mellitus, Type II  (ICD-250.00) 17)  Hypertension  (ICD-401.9) 18)  Hyperlipidemia  (ICD-272.4) 19)  Obesity  (ICD-278.00) 20)  Sleep Apnea, Obstructive  (ICD-327.23) 21)  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 Two Tabs By Mouth Twice  Daily 2)  Zestril 40 Mg Tabs (Lisinopril) .... Take 1 Tablet By Mouth Once A Day 3)  Calcium 600/vitamin D 600-400 Mg-Unit Tabs (Calcium Carbonate-Vitamin D) .... Take 1 Tablet By Mouth Two Times A Day 4)  Bd Ultra-Fine Pen Needles 29g X 12.19mm Misc (Insulin Pen Needle) .... Use As Directed With Solostar Pen 5)  Amaryl 4 Mg Tabs (Glimepiride) .... Take One (1) By Mouth Once A Day 6)  Prandin 2 Mg Tabs (Repaglinide) .Marland Kitchen.. 1tab Po Three Times A Day By Mouth Tab Ac As Directed 7)  Advil Migraine 200 Mg Caps (Ibuprofen) .... Take 1 Pill Every 6 Hours As Needed For Pain 8)  Voltaren 1 % Gel (Diclofenac Sodium) .... Apply Two Times A Day As Directed To Knees For Pain 9)  Table- Over The Bed Table(Rolls) Adjustable Height .... Dx: Arthritis 10)  Darvocet-N 50 50-325 Mg Tabs (Propoxyphene N-Apap) .... Take 1-2  Tablets By Mouth Every 6 Hours As Neede For Pain 11)  Simvastatin 20 Mg Tabs (Simvastatin) .... Take 1 Tablet By Mouth Once A Day  Allergies (verified): 1)  ! Pcn 2)  ! Demerol  Past History:  Past Medical History: Last updated: 07/11/2008 Diabetes mellitus, type II Hyperlipidemia Hypertension Gout  Family History: Last updated: 10/29/2006 Mother/Father premature deaths. Father was a alcoholic. GM was a alcoholic  Social History: Last updated: 10/29/2006 Retired Single Former Smoker - quit  72yrs ago Alcohol use-no Regular exercise-yes  Risk Factors: Exercise: yes (10/29/2006)  Risk Factors: Smoking Status: never (09/14/2009)  Past Surgical History: Hysterectomy-1982-D/T fibroids Knee arthroscopy Laparoscopy D and C Breast Biospy Oophorectomy  Review of Systems  The patient denies anorexia, weight loss, weight gain, chest pain, syncope, peripheral edema, prolonged cough, and headaches.    Physical Exam  General:  alert, well-developed, and overweight-appearing.   Head:  normocephalic and atraumatic.   Neck:  supple.   Abdomen:  Incisions  well-healed   Impression & Recommendations:  Problem # 1:  PELVIC PAIN, RIGHT (ICD-789.09) Improved after surgery Her updated medication list for this problem includes:    Advil Migraine 200 Mg Caps (Ibuprofen) .Marland Kitchen... Take 1 pill every 6 hours as needed for pain    Darvocet-n 50 50-325 Mg Tabs (Propoxyphene n-apap) .Marland Kitchen... Take 1-2  tablets by mouth every 6 hours as neede for pain  Orders: [facility]- Est Level  2 ([address])  Complete Medication List: 1)  Metformin Hcl 500 Mg Tabs (Metformin hcl) .... Two tabs by mouth twice daily 2)  Zestril 40 Mg Tabs (Lisinopril) .... Take 1 tablet by mouth once a day 3)  Calcium 600/vitamin D 600-400 Mg-unit Tabs (Calcium carbonate-vitamin d) .... Take 1 tablet by mouth two times a day 4)  Bd Ultra-fine Pen Needles 29g X 12.33mm Misc (Insulin pen needle) .... Use as directed with solostar pen 5)  Amaryl 4 Mg Tabs (Glimepiride) .... Take one (1) by mouth once a day 6)  Prandin 2 Mg Tabs (Repaglinide) .Marland Kitchen.. 1tab po three times a day by mouth tab ac as directed 7)  Advil Migraine 200 Mg Caps (Ibuprofen) .... Take 1 pill every 6 hours as needed for pain 8)  Voltaren 1 % Gel (Diclofenac sodium) .... Apply two times a day as directed to knees for pain 9)  Table- Over The Bed Table(rolls) Adjustable Height  .... Dx: arthritis 10)  Darvocet-n 50 50-325 Mg Tabs (Propoxyphene n-apap) .... Take 1-2  tablets by mouth every 6 hours as neede for pain 11)  Simvastatin 20 Mg Tabs (Simvastatin) .... Take 1 tablet by mouth once a day  Patient Instructions: 1)  Please schedule a follow-up appointment as needed .

## 2010-07-30 NOTE — Letter (Signed)
Summary: [facility]  [facility]   Imported By: Shon Hough 03/05/2010 16:36:09  _____________________________________________________________________  External Attachment:    Type:   Image     Comment:   External Document

## 2010-07-30 NOTE — Assessment & Plan Note (Signed)
Summary: FU VISIT/DS   Vital Signs:  Patient profile:   70 year old female Height:      62.5 inches (158.75 cm) Weight:      248.01 pounds (112.73 kg) BMI:     44.80 Temp:     98.5 degrees F (36.94 degrees C) oral BP sitting:   144 / 77  (left arm) Cuff size:   large  Vitals Entered By: Angelina Ok RN (December 13, 2009 10:02 AM) Is Patient Diabetic? Yes Did you bring your meter with you today? Yes Pain Assessment Patient in pain? yes     Location: right knee Intensity: 8 Type: aching Onset of pain  Intermittent Nutritional Status BMI of > 30 = obese CBG Result 160  Have you ever been in a relationship where you felt threatened, hurt or afraid?No   Does patient need assistance? Functional Status Self care Ambulation Impaired:Risk for fall Comments Pain in right.  Limping.   Primary Care Provider:  [name]  DO   History of Present Illness: Ms. Looney comes in today for routine follow-up. Her major complaint today is related to right knee pain. Worse now that she is working again and is more active.Still has occasional diarrhea. Otherwise she reports doing well.  Depression History:      The patient denies a depressed mood most of the day and a diminished interest in her usual daily activities.        The patient denies that she feels like life is not worth living, denies that she wishes that she were dead, and denies that she has thought about ending her life.         Preventive Screening-Counseling & Management  Alcohol-Tobacco     Alcohol drinks/day: 0     Smoking Status: quit     Year Quit: 1990  Current Medications (verified): 1)  Metformin Hcl 500 Mg Tabs (Metformin Hcl) .... Two Tabs By Mouth Twice Daily 2)  Zestril 40 Mg Tabs (Lisinopril) .... Take 1 Tablet By Mouth Once A Day 3)  Calcium 600/vitamin D 600-400 Mg-Unit Tabs (Calcium Carbonate-Vitamin D) .... Take 1 Tablet By Mouth Two Times A Day 4)  Bd Ultra-Fine Pen Needles 29g X 12.51mm Misc (Insulin  Pen Needle) .... Use As Directed With Solostar Pen 5)  Amaryl 4 Mg Tabs (Glimepiride) .... Take One (1) By Mouth Once A Day 6)  Prandin 2 Mg Tabs (Repaglinide) .Marland Kitchen.. 1tab Po Three Times A Day By Mouth Tab Ac As Directed 7)  Voltaren 1 % Gel (Diclofenac Sodium) .... Apply Two Times A Day As Directed To Knees For Pain 8)  Table- Over The Bed Table(Rolls) Adjustable Height .... Dx: Arthritis 9)  Simvastatin 20 Mg Tabs (Simvastatin) .... Take 1 Tablet By Mouth Once A Day 10)  Vicodin 5-500 Mg Tabs (Hydrocodone-Acetaminophen) .... Take 1-2 Tablets By Mouth Every 6 Hours As Needed For Pain 11)  Robaxin 500 Mg Tabs (Methocarbamol) .... Take One Tab By Mouth Every 6 Hours As Needed For Muscle Spasm 12)  Vicodin 5-500 Mg Tabs (Hydrocodone-Acetaminophen) .... Take 1 Tablet By Mouth Every 6 Hours 13)  Hydrochlorothiazide 25 Mg Tabs (Hydrochlorothiazide) .... Take 1 Tablet By Mouth Once A Day  Allergies (verified): 1)  ! Pcn 2)  ! Demerol  Social History: Retired Single Former Smoker - quit 64yrs ago Alcohol use-no Regular exercise-yes Does childcare work occasionally.  Review of Systems      See HPI  Physical Exam  General:  alert, well-nourished, and well-hydrated.  Neck:  supple and full ROM.   Lungs:  normal respiratory effort, no accessory muscle use, and normal breath sounds.   Heart:  normal rate, regular rhythm, and no murmur.   Abdomen:  soft, non-tender, normal bowel sounds, and no distention.   Msk:  normal ROM in upper and lower extremities, no joint tenderness, no joint swelling, and no joint warmth.    Slight pain on patellatar tracking test of right knee, mild effusion present  Extremities:  trace left pedal edema and trace right pedal edema.     Impression & Recommendations:  Problem # 1:  DIARRHEA, RECURRENT (ICD-787.91) possible lactose intolerance. Discussed reduction in fatty foods. May need to check GB if problems continues.  Problem # 2:  HYPERTENSION  (ICD-401.9) Higher than I would like it to be today but she tells me at home it is normal. Continue current meds. Her updated medication list for this problem includes:    Zestril 40 Mg Tabs (Lisinopril) .Marland Kitchen... Take 1 tablet by mouth once a day    Hydrochlorothiazide 25 Mg Tabs (Hydrochlorothiazide) .Marland Kitchen... Take 1 tablet by mouth once a day  BP today: 144/77 Prior BP: 146/69 (10/26/2009)  Prior 10 Yr Risk Heart Disease: 24 % (03/21/2009)  Labs Reviewed: K+: 3.9 (10/18/2009) Creat: : 0.57 (10/18/2009)   Chol: 200 (04/17/2009)   HDL: 55 (04/17/2009)   LDL: 127 (04/17/2009)   TG: 92 (04/17/2009)  Problem # 3:  DIABETES MELLITUS, TYPE II (ICD-250.00) A1C stable at 7.4 our goal is reduction to below 7.0. We discussed Inititaing insuin or Byetta/Victozaand stopping some of her po meds. She wants to think about this until her next visit.  Her updated medication list for this problem includes:    Metformin Hcl 500 Mg Tabs (Metformin hcl) .Marland Kitchen..Marland Kitchen Two tabs by mouth twice daily    Zestril 40 Mg Tabs (Lisinopril) .Marland Kitchen... Take 1 tablet by mouth once a day    Amaryl 4 Mg Tabs (Glimepiride) .Marland Kitchen... Take one (1) by mouth once a day    Prandin 2 Mg Tabs (Repaglinide) .Marland Kitchen... 1tab po three times a day by mouth tab ac as directed  Orders: T- Capillary Blood Glucose (13086) T-Hgb A1C (in-house) (57846NG)  Labs Reviewed: Creat: 0.57 (10/18/2009)     Last Eye Exam: No diabetic retinopathy.   Glaucoma suspected.  (10/03/2007) Reviewed HgBA1c results: 7.4 (in hospital) (08/07/2009)  7.5 (02/22/2009)  Labs Reviewed: Creat: 0.57 (10/18/2009)     Last Eye Exam: No diabetic retinopathy.   Glaucoma suspected.  (10/03/2007) Reviewed HgBA1c results: 7.7 (12/13/2009)  7.4 (in hospital) (08/07/2009)  Problem # 4:  DENTAL PAIN (ICD-525.9) resolved.  Problem # 5:  KNEE PAIN, RIGHT (ICD-719.46) Xrays and [facility] referral.  Her updated medication list for this problem includes:    Vicodin 5-500 Mg Tabs  (Hydrocodone-acetaminophen) .Marland Kitchen... Take 1-2 tablets by mouth every 6 hours as needed for pain    Robaxin 500 Mg Tabs (Methocarbamol) .Marland Kitchen... Take one tab by mouth every 6 hours as needed for muscle spasm    Vicodin 5-500 Mg Tabs (Hydrocodone-acetaminophen) .Marland Kitchen... Take 1 tablet by mouth every 6 hours  Orders: Sports Medicine (Sports Med) T-DG Knee 2 Views*R* [phone])  Problem # 6:  ANEMIA, MILD (ICD-285.9)  Orders: T-CBC w/Diff ([id number])  Complete Medication List: 1)  Metformin Hcl 500 Mg Tabs (Metformin hcl) .... Two tabs by mouth twice daily 2)  Zestril 40 Mg Tabs (Lisinopril) .... Take 1 tablet by mouth once a day 3)  Calcium 600/vitamin D 600-400 Mg-unit  Tabs (Calcium carbonate-vitamin d) .... Take 1 tablet by mouth two times a day 4)  Bd Ultra-fine Pen Needles 29g X 12.62mm Misc (Insulin pen needle) .... Use as directed with solostar pen 5)  Amaryl 4 Mg Tabs (Glimepiride) .... Take one (1) by mouth once a day 6)  Prandin 2 Mg Tabs (Repaglinide) .Marland Kitchen.. 1tab po three times a day by mouth tab ac as directed 7)  Voltaren 1 % Gel (Diclofenac sodium) .... Apply two times a day as directed to knees for pain 8)  Table- Over The Bed Table(rolls) Adjustable Height  .... Dx: arthritis 9)  Simvastatin 20 Mg Tabs (Simvastatin) .... Take 1 tablet by mouth once a day 10)  Vicodin 5-500 Mg Tabs (Hydrocodone-acetaminophen) .... Take 1-2 tablets by mouth every 6 hours as needed for pain 11)  Robaxin 500 Mg Tabs (Methocarbamol) .... Take one tab by mouth every 6 hours as needed for muscle spasm 12)  Vicodin 5-500 Mg Tabs (Hydrocodone-acetaminophen) .... Take 1 tablet by mouth every 6 hours 13)  Hydrochlorothiazide 25 Mg Tabs (Hydrochlorothiazide) .... Take 1 tablet by mouth once a day  Patient Instructions: 1)  Need lab appointment (nurse BP check) in 3-4 weeks. 2)  Need to see Phillips Odor 3 months. Prescriptions: HYDROCHLOROTHIAZIDE 25 MG TABS (HYDROCHLOROTHIAZIDE) Take 1 tablet by mouth once a day  #30 x  2   Entered and Authorized by:   [name]  DO   Signed by:   [name]  DO on 12/13/2009   Method used:   Electronically to        [facility] (retail)       [address]       [place], [place]  [id number]       Ph: [phone]       Fax: [phone]   RxID:   [id number]   Prevention & Chronic Care Immunizations   Influenza vaccine: Not documented   Influenza vaccine deferral: Refused  (08/27/2009)    Tetanus booster: Not documented   Td booster deferral: Deferred  (08/27/2009)    Pneumococcal vaccine: Not documented   Pneumococcal vaccine deferral: Not indicated  (08/27/2009)    H. zoster vaccine: Not documented   H. zoster vaccine deferral: Deferred  (08/27/2009)  Colorectal Screening   Hemoccult: Not documented   Hemoccult action/deferral: Not indicated  (04/16/2009)    Colonoscopy: Normal  (07/28/2006)   Colonoscopy due: 01/2016  Other Screening   Pap smear: Not documented   Pap smear action/deferral: Not indicated S/P hysterectomy  (08/27/2009)    Mammogram: ASSESSMENT: Negative - BI-RADS 1^MM DIGITAL SCREENING  (08/20/2009)   Mammogram action/deferral: Deferred  (08/27/2009)   Mammogram due: 08/2007    DXA bone density scan: Not documented   DXA bone density action/deferral: Deferred  (08/27/2009)   Smoking status: quit  (12/13/2009)  Diabetes Mellitus   HgbA1C: 7.7  (12/13/2009)   HgbA1C action/deferral: Deferred  (08/27/2009)    Eye exam: No diabetic retinopathy.   Glaucoma suspected.   (10/03/2007)   Diabetic eye exam action/deferral: Ophthalmology referral  (10/18/2009)    Foot exam: yes  (05/01/2008)   Foot exam action/deferral: Do today   High risk foot: Not documented   Foot care education: Not documented   Foot exam due: 10/18/2008    Urine microalbumin/creatinine ratio: 3.7  (04/17/2009)  Lipids   Total Cholesterol: 200  (04/17/2009)   LDL: 127  (04/17/2009)   LDL Direct: Not documented   HDL:  55  (04/17/2009)  Triglycerides: 92  (04/17/2009)    SGOT (AST): 18  (10/18/2009)   SGPT (ALT): 30  (10/18/2009)   Alkaline phosphatase: 78  (10/18/2009)   Total bilirubin: 0.9  (10/18/2009)  Hypertension   Last Blood Pressure: 144 / 77  (12/13/2009)   Serum creatinine: 0.57  (10/18/2009)   BMP action: Deferred   Serum potassium 3.9  (10/18/2009)  Self-Management Support :   Personal Goals (by the next clinic visit) :     Personal A1C goal: 7  (03/21/2009)     Personal blood pressure goal: 140/90  (03/21/2009)     Personal LDL goal: 100  (03/21/2009)    Patient will work on the following items until the next clinic visit to reach self-care goals:     Medications and monitoring: take my medicines every day, check my blood sugar, bring all of my medications to every visit, weigh myself weekly, examine my feet every day  (12/13/2009)     Eating: drink diet soda or water instead of juice or soda, eat more vegetables, use fresh or frozen vegetables, eat foods that are low in salt, eat baked foods instead of fried foods, eat fruit for snacks and desserts, limit or avoid alcohol  (12/13/2009)     Activity: take a 30 minute walk every day  (12/13/2009)     Other: congratulations on your weight loss, continue the good work  (04/16/2009)    Diabetes self-management support: Copy of home glucose meter record, Written self-care plan, Education handout, Pre-printed educational material, Resources for patients handout  (12/13/2009)   Diabetes care plan printed   Diabetes education handout printed   Last diabetes self-management training by diabetes educator: 12/02/2007   Last medical nutrition therapy: 05/01/2008    Hypertension self-management support: Written self-care plan, Education handout, Pre-printed educational material, Resources for patients handout  (12/13/2009)   Hypertension self-care plan printed.   Hypertension education handout printed    Lipid self-management support: Written  self-care plan, Education handout, Pre-printed educational material, Resources for patients handout  (12/13/2009)   Lipid self-care plan printed.   Lipid education handout printed      Resource handout printed.  Process Orders Check Orders Results:     [facility]: Check successful Tests Sent for requisitioning (January 28, 2010 3:38 PM):     12/13/2009: [facility] -- T-CBC w/Diff [[id number]] (signed)     Vital Signs:  Patient profile:   70 year old female Height:      62.5 inches (158.75 cm) Weight:      248.01 pounds (112.73 kg) BMI:     44.80 Temp:     98.5 degrees F (36.94 degrees C) oral BP sitting:   144 / 77  (left arm) Cuff size:   large  Vitals Entered By: Angelina Ok RN (December 13, 2009 10:02 AM)   Laboratory Results   Blood Tests   Date/Time Received: December 13, 2009 11:54 AM Date/Time Reported: Alric Quan  December 13, 2009 11:54 AM   HGBA1C: 7.7%   (Normal Range: Non-Diabetic - 3-6%   Control Diabetic - 6-8%) CBG Random:: 160mg /dL

## 2010-08-01 NOTE — Progress Notes (Signed)
Summary: appt/ hla  Phone Note Call from Patient   Summary of Call: pt calls to say she noticed last pm that there was a little dark bump under her breast, she put some pressure on it and something spurted out, she has a hx of mrsa and is concerned, she request an appt 1/5 and is ask to call at 0830 to 0900 1/5 and speak w/ gayle, she is agreeable to this. Initial call taken by: Marin Roberts RN,  July 03, 2010 2:51 PM  Follow-up for Phone Call        appointmnet given for 1030 today Follow-up by: Merrie Roof RN,  July 04, 2010 9:18 AM

## 2010-08-01 NOTE — Assessment & Plan Note (Signed)
Summary: ADD PER GLADYS/EYE PROLEMS/GOLDING/CFB   Vital Signs:  Patient profile:   70 year old female Height:      62.5 inches Weight:      254.3 pounds BMI:     45.94 Temp:     97.5 degrees F oral Pulse rate:   80 / minute BP sitting:   149 / 81  (right arm)  Vitals Entered By: Filomena Jungling NT II (June 18, 2010 1:51 PM) CC: FOllow-up visit  Is Patient Diabetic? Yes Did you bring your meter with you today? Yes Nutritional Status BMI of > 30 = obese CBG Result 139  Have you ever been in a relationship where you felt threatened, hurt or afraid?No   Does patient need assistance? Functional Status Self care Ambulation Normal   Primary Care Provider:  [name]  DO  CC:  FOllow-up visit .  History of Present Illness: 70 y/o female with pmh outlined below presenting to the clinic with the following chief complaint:  - right eye redness - patient states she woke up around 3am and could not completely open her right eye, went back to sleep, woke up a bit later and noted some whitish/crusty discharge. She denies any pain, change in vision, headache or fever. Of note, patient was just seen in the ED where she had presented with left eye redness, was diagnosed with hordoleum and sent home on tobramycin eyedrops. She tried applying it to her right eye this am, but decided to come to the clinic for further evaluation.   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) 8)  Osteoarthritis, Knee  (ICD-715.96) 9)  Other and Unspecified  Ovarian Cyst  (ICD-620.2) 10)  Pelvic Pain, Right  (ICD-789.09) 11)  Mole  (ICD-216.9) 12)  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) 13)  Gout, Unspecified  (ICD-274.9) 14)  Foot Pain, Left  (ICD-729.5) 15)  Diarrhea, Recurrent  (ICD-787.91) 16)  Pruritus  (ICD-698.9) 17)  Health Maintenance Exam  (ICD-V70.0) 18)  Hx of Gerd  (ICD-530.81) 19)  Hx of Osteoarthrosis, Generalized, Multiple Sites  (ICD-715.09) 20)  Hx of Hearing Loss, Bilateral  (ICD-389.9) 21)  Hysterectomy, Hx of  (ICD-V45.77) 22)  Diabetes Mellitus, Type II  (ICD-250.00) 23)  Hypertension  (ICD-401.9) 24)  Hyperlipidemia  (ICD-272.4) 25)  Obesity  (ICD-278.00) 26)  Sleep Apnea, Obstructive  (ICD-327.23) 27)  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 Two Tabs By Mouth Twice Daily 2)  Zestril 40 Mg Tabs (Lisinopril) .... Take 1 Tablet By Mouth Once A Day 3)  Calcium 600/vitamin D 600-400 Mg-Unit Tabs (Calcium Carbonate-Vitamin D) .... Take 1 Tablet By Mouth Two Times A Day 4)  Amaryl 4 Mg Tabs (Glimepiride) .... Take One (1) By Mouth Once A Day 5)  Prandin 2 Mg Tabs (Repaglinide) .Marland Kitchen.. 1tab Po Three  Times A Day By Mouth Tab Ac As Directed 6)  Voltaren 1 % Gel (Diclofenac Sodium) .... Apply Two Times A Day As Directed To Knees For Pain 7)  Simvastatin 20 Mg Tabs (Simvastatin) .... Take 1 Tablet By Mouth Once A Day 8)  Hydrochlorothiazide 25 Mg Tabs (Hydrochlorothiazide) .... Take 1 Tablet By Mouth Once A Day 9)  Prodigy Blood Glucose Test  Strp (Glucose Blood) .... 90 Day Supply 250.00 Use As Directed 3 Times Daily 10)  Tobramycin-Dexamethasone 0.3-0.1 % Susp (Tobramycin-Dexamethasone) .Marland Kitchen.. 1 To 2 Drops in Each Eye Every 4 To 6 Hours  Allergies (verified): 1)  ! Pcn 2)  ! Demerol  Past History:  Past Medical History: Last  updated: 07/11/2008 Diabetes mellitus, type II Hyperlipidemia Hypertension Gout  Past Surgical History: Last updated: 09/14/2009 Hysterectomy-1982-D/T fibroids Knee arthroscopy Laparoscopy D and C Breast Biospy Oophorectomy  Family History: Last updated: 10/29/2006 Mother/Father premature deaths. Father was a alcoholic. GM was a alcoholic  Social History: Last updated: 12/13/2009 Retired Single Former Smoker - quit 27yrs ago Alcohol use-no Regular exercise-yes Does childcare work occasionally.  Risk Factors: Alcohol Use: 0 (06/18/2010) Exercise: yes (06/18/2010)  Risk Factors: Smoking Status: quit (06/18/2010)  Review of Systems      See HPI  Physical Exam  General:  Vital signs reviewed and noted. Well-developed,well-nourished,in no acute distress; alert,appropriate and cooperative throughout examination. Head: normocephalic, atraumatic. Eyes:  right eye: vision grossly intact, pupils equal, pupils round, pupils reactive to light, and conjunctival injection noted on the lower part of her sclera. no discharge noted. bilateral cataracts noted. left eye: hordeolum on bottom eyelid.   Neck: No deformities, masses, or tenderness noted. Lungs: Normal respiratory effort. Clear to auscultation BL without crackles or wheezes.  Heart: RRR. S1 and S2 normal without gallop, murmur, or rubs.  Abdomen: BS normoactive. Soft, Nondistended, non-tender.  No masses or organomegaly. Extremities: No pretibial edema.    Impression & Recommendations:  Problem # 1:  CONJUNCTIVITIS (ICD-372.30) Will rx with Dobrex for now. Will also help calm down the inflammation. Not concerning for glaucoma given the absence of pain. Also instructed to practise strict handwashing at all times. To return to clinic or call us if symptoms worsen.   Complete Medication List: 1)  Metformin Hcl 500 Mg Tabs (Metformin hcl) .... Two tabs by mouth twice daily 2)  Zestril 40 Mg Tabs (Lisinopril) ....  Take 1 tablet by mouth once a day 3)  Calcium 600/vitamin D 600-400 Mg-unit Tabs (Calcium carbonate-vitamin d) .... Take 1 tablet by mouth two times a day 4)  Amaryl 4 Mg Tabs (Glimepiride) .... Take one (1) by mouth once a day 5)  Prandin 2 Mg Tabs (Repaglinide) .Marland Kitchen.. 1tab po three times a day by mouth tab ac as directed 6)  Voltaren 1 % Gel (Diclofenac sodium) .... Apply two times a day as directed to knees for pain 7)  Simvastatin 20 Mg Tabs (Simvastatin) .... Take 1 tablet by mouth once a day 8)  Hydrochlorothiazide 25 Mg Tabs (Hydrochlorothiazide) .... Take 1 tablet by mouth once a day 9)  Prodigy Blood Glucose Test Strp (Glucose blood) .... 90 day supply 250.00 use as directed 3 times daily 10)  Tobramycin-dexamethasone 0.3-0.1 % Susp (Tobramycin-dexamethasone) .Marland Kitchen.. 1 to 2 drops in each eye every 4 to 6 hours  Other Orders: T- Capillary Blood Glucose (82948) T-Hgb A1C (in-house) (04540JW)  Patient Instructions: 1)  Apply the eye drops to both eyes. 2)  Make sure to wash hands at all  times as your eye infection may be contagious. 3)  You can use warm compresses every morning to help open the eyes and remove the crusty discharge. 4)  Pls make a followup appointment with Dr. Phillips Odor at next available. Prescriptions: VOLTAREN 1 % GEL (DICLOFENAC SODIUM) Apply two times a day as directed to knees for pain  #1 tube x 3   Entered and Authorized by:   Jaci Lazier MD   Signed by:   Jaci Lazier MD on 06/18/2010   Method used:   Print then Give to Patient   RxID:   [id number] TOBRAMYCIN-DEXAMETHASONE 0.3-0.1 % SUSP (TOBRAMYCIN-DEXAMETHASONE) 1 to 2 drops in each eye every 4 to 6 hours  #1 x 0   Entered and Authorized by:   Jaci Lazier MD   Signed by:   Jaci Lazier MD on 06/18/2010   Method used:   Print then Give to Patient   RxID:   [id number]    Orders Added: 1)  T- Capillary Blood Glucose [82948] 2)  T-Hgb A1C (in-house) [02725DG] 3)  Est. Patient Level III  [64403]    Prevention & Chronic Care Immunizations   Influenza vaccine: Not documented   Influenza vaccine deferral: Refused  (08/27/2009)    Tetanus booster: Not documented   Td booster deferral: Deferred  (08/27/2009)    Pneumococcal vaccine: Not documented   Pneumococcal vaccine deferral: Not indicated  (08/27/2009)    H. zoster vaccine: Not documented   H. zoster vaccine deferral: Deferred  (08/27/2009)  Colorectal Screening   Hemoccult: Not documented   Hemoccult action/deferral: Not indicated  (04/16/2009)    Colonoscopy: Normal  (07/28/2006)   Colonoscopy due: 01/2016  Other Screening   Pap smear: Not documented   Pap smear action/deferral: Not indicated S/P hysterectomy  (08/27/2009)    Mammogram: ASSESSMENT: Negative - BI-RADS 1^MM DIGITAL SCREENING  (08/20/2009)   Mammogram action/deferral: Deferred  (08/27/2009)   Mammogram due: 08/2007    DXA bone density scan: Not documented   DXA bone density action/deferral: Deferred  (08/27/2009)   Smoking status: quit  (06/18/2010)  Diabetes Mellitus   HgbA1C: 7.7  (06/18/2010)   HgbA1C action/deferral: Deferred  (08/27/2009)    Eye exam: No diabetic retinopathy.   Glaucoma suspected.   (10/03/2007)   Diabetic eye exam action/deferral: Ophthalmology referral  (10/18/2009)    Foot exam: yes  (04/11/2010)   Foot exam action/deferral: Do today   High risk foot: No  (04/11/2010)   Foot care education: Not documented   Foot exam due: 10/18/2008    Urine microalbumin/creatinine ratio: 3.7  (04/17/2009)  Lipids   Total Cholesterol: 202  (01/09/2010)   LDL: 118  (01/09/2010)   LDL Direct: Not documented   HDL: 62  (01/09/2010)   Triglycerides: 112  (01/09/2010)    SGOT (AST): 18  (10/18/2009)   SGPT (ALT): 30  (10/18/2009)   Alkaline phosphatase: 78  (10/18/2009)   Total bilirubin: 0.9  (10/18/2009)  Hypertension   Last Blood Pressure: 149 / 81  (06/18/2010)   Serum creatinine: 0.67  (01/03/2010)   BMP  action: Deferred   Serum potassium 4.5  (01/03/2010)  Self-Management Support :   Personal Goals (by the next clinic visit) :     Personal A1C goal: 7  (03/21/2009)     Personal blood pressure goal: 140/90  (03/21/2009)     Personal LDL goal: 100  (03/21/2009)    Patient will work on the following items until the next clinic visit to reach self-care goals:  Medications and monitoring: take my medicines every day, check my blood sugar, examine my feet every day  (06/18/2010)     Eating: eat more vegetables, use fresh or frozen vegetables, eat foods that are low in salt, eat baked foods instead of fried foods  (06/18/2010)     Activity: take a 30 minute walk every day  (04/11/2010)     Other: congratulations on your weight loss, continue the good work  (04/16/2009)    Diabetes self-management support: Education handout  (06/18/2010)   Diabetes education handout printed   Last diabetes self-management training by diabetes educator: 12/02/2007   Last medical nutrition therapy: 05/01/2008    Hypertension self-management support: Education handout  (06/18/2010)   Hypertension education handout printed    Lipid self-management support: Education handout  (06/18/2010)     Lipid education handout printed     Laboratory Results   Blood Tests   Date/Time Received: June 18, 2010 1:57 PM Date/Time Reported: Alric Quan  June 18, 2010 1:57 PM   HGBA1C: 7.7%   (Normal Range: Non-Diabetic - 3-6%   Control Diabetic - 6-8%) CBG Random:: 139mg /dL

## 2010-08-01 NOTE — Assessment & Plan Note (Signed)
Summary: PAIN IN STOMACH/KH   Vital Signs:  Patient profile:   70 year old female Height:      62.5 inches Weight:      253.4 pounds BMI:     45.77 Temp:     98.3 degrees F oral Pulse rate:   78 / minute BP sitting:   158 / 83  (left arm) Cuff size:   large  Vitals Entered By: Jimmy Footman, CMA (June 25, 2010 8:50 AM) CC: stomach pain @ surgey site x1 week Is Patient Diabetic? Yes Did you bring your meter with you today? No Pain Assessment Patient in pain? yes     Location: abdomen Intensity: 3 Type: aching   Primary Care Provider:  [name]  DO  CC:  stomach pain @ surgey site x1 week.  History of Present Illness: Here today with several day h/o RLQ pain and feeling weird.  She denies fever, chills, nausea, vomiting, diarrhea or constipation.  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  Reports pain is above old surgical incision.  Pain occurred a few days ago, but is no longer present.  She is seeing GI physician today.  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) 14)  Gout, Unspecified  (ICD-274.9) 15)  Foot Pain, Left  (ICD-729.5) 16)  Diarrhea, Recurrent  (ICD-787.91) 17)  Pruritus  (ICD-698.9) 18)  Health Maintenance Exam  (ICD-V70.0) 19)  Hx of Gerd  (ICD-530.81) 20)  Hx of Osteoarthrosis, Generalized, Multiple Sites  (ICD-715.09) 21)  Hx of Hearing Loss, Bilateral  (ICD-389.9) 22)  Hysterectomy, Hx of   (ICD-V45.77) 23)  Diabetes Mellitus, Type II  (ICD-250.00) 24)  Hypertension  (ICD-401.9) 25)  Hyperlipidemia  (ICD-272.4) 26)  Obesity  (ICD-278.00) 27)  Sleep Apnea, Obstructive  (ICD-327.23) 28)  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 Two Tabs By Mouth Twice Daily 2)  Zestril 40 Mg Tabs (Lisinopril) .... Take 1 Tablet By Mouth Once A Day 3)  Calcium 600/vitamin D 600-400 Mg-Unit Tabs (Calcium Carbonate-Vitamin D) .... Take 1 Tablet By Mouth Two Times A Day 4)  Amaryl 4 Mg Tabs (Glimepiride) .... Take One (1) By Mouth Once A Day 5)  Prandin 2 Mg Tabs (Repaglinide) .Marland Kitchen.. 1tab Po Three Times A Day By Mouth Tab Ac As Directed 6)  Voltaren 1 % Gel (Diclofenac Sodium) .... Apply Two Times A Day As Directed To Knees For Pain 7)  Simvastatin 20 Mg Tabs (Simvastatin) .... Take 1 Tablet By Mouth Once A Day 8)  Hydrochlorothiazide 25 Mg Tabs (Hydrochlorothiazide) .... Take 1 Tablet By Mouth Once A Day 9)  Prodigy Blood Glucose Test  Strp (Glucose Blood) .... 90 Day Supply 250.00 Use As Directed 3 Times Daily  Allergies (verified): 1)  ! Pcn 2)  ! Demerol  Past History:  Past Medical History: Last updated: 07/11/2008 Diabetes mellitus, type II Hyperlipidemia Hypertension Gout  Past Surgical History: Last updated: 09/14/2009 Hysterectomy-1982-D/T fibroids Knee arthroscopy Laparoscopy D and C Breast Biospy Oophorectomy  Family History: Last updated: 10/29/2006 Mother/Father premature deaths. Father was a alcoholic. 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  Risk Factors: Alcohol Use: 0 (06/18/2010) Exercise: yes (06/18/2010)  Risk Factors: Smoking Status: quit  (06/25/2010)  Review of Systems       The patient complains of abdominal pain.  The patient denies anorexia, decreased hearing, hoarseness, chest pain, syncope, dyspnea on exertion, headaches, and hematochezia.    Physical Exam  General:  alert and overweight-appearing.   Head:  normocephalic and atraumatic.   Neck:  supple.   Abdomen:  soft, non-tender, no distention, no masses, no rebound tenderness, no abdominal hernia, no hepatomegaly, and no splenomegaly.     Impression & Recommendations:  Problem # 1:  ABDOMINAL PAIN (ICD-789.00)  Unclear etiology.  ? related to diverticular disease.  To consult with GI.  Orders: [facility]- Est Level  3 (32440)  Complete Medication List: 1)  Metformin Hcl 500 Mg Tabs (Metformin hcl) .... Two tabs by mouth twice daily 2)  Zestril 40 Mg Tabs (Lisinopril) .... Take 1 tablet by mouth once a day 3)  Calcium 600/vitamin D 600-400 Mg-unit Tabs (Calcium carbonate-vitamin d) .... Take 1 tablet by mouth two times a day 4)  Amaryl 4 Mg Tabs (Glimepiride) .... Take one (1) by mouth once a day 5)  Prandin 2 Mg Tabs (Repaglinide) .Marland Kitchen.. 1tab po three times a day by mouth tab ac as directed 6)  Voltaren 1 % Gel (Diclofenac sodium) .... Apply two times a day as directed to knees for pain 7)  Simvastatin 20 Mg Tabs (Simvastatin) .... Take 1 tablet by mouth once a day 8)  Hydrochlorothiazide 25 Mg Tabs (Hydrochlorothiazide) .... Take 1 tablet by mouth once a day 9)  Prodigy Blood Glucose Test Strp (Glucose blood) .... 90 day supply 250.00 use as directed 3 times daily  Patient Instructions: 1)  Please schedule a follow-up appointment as needed .    Orders Added: 1)  [facility]- Est Level  3 [10272]

## 2010-08-01 NOTE — Assessment & Plan Note (Signed)
Summary: f/u rt knee pain/ACM   Vital Signs:  Patient profile:   70 year old female Pulse rate:   70 / minute BP sitting:   129 / 78  (right arm)  Vitals Entered By: Rochele Pages RN (July 05, 2010 9:00 AM) CC: f/u L knee pain   Primary Care Provider:  [name]  DO  CC:  f/u L knee pain.  History of Present Illness: 70 yo F f/u Rt knee DJD.  Sig medial DJD on xray several months ago.  At rest 2/10, while walking has 20/10 medial knee pain.  Occasional swelling, no mech symptoms.  Occasional ibuprofen, with little relief.  Had injection in 6/11 that worked very well for 1 week, then ok relief for 1-2 months.  Is trying to do some upper body exercises.  Not doing any elliptical or stat bike. Interested in TKA at some point.  Habits & Providers  Alcohol-Tobacco-Diet     Tobacco Status: quit  Allergies: 1)  ! Pcn 2)  ! Demerol  Physical Exam  General:  overweight-appearing.   Msk:  Rt knee: FROM, no effusion.  + medial joint line ttp.  mild crepitus.  Lig appear intact.   Impression & Recommendations:  Problem # 1:  OSTEOARTHRITIS, KNEE (ICD-715.96) Assessment Deteriorated  Getting somewhat worse, reviewed knee films today that show sig med DJD.  Discussed options, reviewed low impact exercise.  Suggested glucosamine/chondroitin, though unlikely to benefit with advanced DJD.  At this point, getting close to wanting TKA. - CSI today, f/u 3 months for repeat if desired - plans to get TKA maybe this upcoming summer  Consent obtained and verified. Sterile betadine prep. Furthur cleansed with alcohol. Topical analgesic spray: Ethyl chloride. Joint: Rt knee Approached in typical fashion with: anteromedial approach Completed without difficulty Meds: 1 cc kenalog 40 mg 4 cc lidocaine 1% Needle: 21 G 1.5 inch Aftercare instructions and Red flags advised.    Orders: Joint Aspirate / Injection, Large (20610) Kenalog 10 mg inj (J3301)  Complete Medication List: 1)   Zestril 40 Mg Tabs (Lisinopril) .... Take 1 tablet by mouth once a day 2)  Calcium 600/vitamin D 600-400 Mg-unit Tabs (Calcium carbonate-vitamin d) .... Take 1 tablet by mouth two times a day 3)  Amaryl 4 Mg Tabs (Glimepiride) .... Take one (1) by mouth once a day 4)  Prandin 2 Mg Tabs (Repaglinide) .Marland Kitchen.. 1tab po three times a day by mouth tab ac as directed 5)  Voltaren 1 % Gel (Diclofenac sodium) .... Apply two times a day as directed to knees for pain 6)  Simvastatin 20 Mg Tabs (Simvastatin) .... Take 1 tablet by mouth once a day 7)  Hydrochlorothiazide 25 Mg Tabs (Hydrochlorothiazide) .... Take 1 tablet by mouth once a day 8)  Prodigy Blood Glucose Test Strp (Glucose blood) .... 90 day supply 250.00 use as directed 3 times daily 9)  Clindamycin Hcl 300 Mg Caps (Clindamycin hcl) .... Take 1 tablet by mouth three times a day   Orders Added: 1)  Est. Patient Level III [82956] 2)  Joint Aspirate / Injection, Large [20610] 3)  Kenalog 10 mg inj [J3301]

## 2010-08-01 NOTE — Op Note (Signed)
Summary: Consent  Consent   Imported By: Marily Memos 07/05/2010 10:01:52  _____________________________________________________________________  External Attachment:    Type:   Image     Comment:   External Document

## 2010-08-01 NOTE — Progress Notes (Signed)
Summary: Eye  Phone Note Call from Patient   Caller: Patient Call For: [name]  DO Summary of Call: RTC to pt right eye red on right side.  No pain.  Is running.  Left eye has the stye.  Same problem as before.  Pt was given an appointment for this afternoon. Angelina Ok RN  June 18, 2010 10:14 AM  Initial call taken by: Angelina Ok RN,  June 18, 2010 10:14 AM  Follow-up for Phone Call        Agree with plan. Follow-up by: Margarito Liner MD,  June 18, 2010 10:20 AM

## 2010-08-01 NOTE — Consult Note (Signed)
Summary: [facility]   Imported By: Margie Billet 07/05/2010 11:54:20  _____________________________________________________________________  External Attachment:    Type:   Image     Comment:   External Document

## 2010-08-01 NOTE — Progress Notes (Signed)
Summary: update on holding metformin trail/dmr  Phone Note Call from Patient Call back at Home Phone [phone]   Caller: Patient Summary of Call: Patient called to report on trial of holding metformin: 1- has only had one day (2 days after stopping metformin) when she needed immodium) diarrhea/loose stools have ceased. she is having daily BMs within normal limits. 2- blood sugars are higher- mostly fasting 200s- only 1 was 128, not checking in pm she has not been taking amaryl and prandin as she should.  3- wants to try to take amaryl 4 mg daily and prandin with meals as she should be doing and do more checking of blood sugar in PMs and call us at end of next week for adjustment or visit if needed.   4- suggested now that she knows that it is th emetformin causing the problem to think about if she even wants to retry at lower doses Initial call taken by: Jamison Neighbor RD,CDE,  July 12, 2010 10:56 AM  Follow-up for Phone Call        I think we have several options for medication regimens other than metformin.Lets chat about next steps... Thank you for the very appropriate and helpful call to her I couldnt agree more !!! Follow-up by: [name]  DO,  July 12, 2010 4:46 PM

## 2010-08-01 NOTE — Progress Notes (Signed)
Summary: Refill/gh  Phone Note Refill Request Message from:  Fax from Pharmacy on June 13, 2010 4:58 PM  Refills Requested: Medication #1:  METFORMIN HCL 500 MG TABS Two tabs by mouth twice daily   Last Refilled: 04/18/2010  Method Requested: Electronic Initial call taken by: Angelina Ok RN,  June 13, 2010 4:59 PM  Follow-up for Phone Call        sent e-script Follow-up by: [name]  DO,  June 17, 2010 1:02 PM

## 2010-08-01 NOTE — Assessment & Plan Note (Signed)
Summary: change meds/gg  MEDICAL NUTRITION THERAPY  Assessment: Patient requests assist with blood sugar control and loose stools. Has been to gastroenterologiost, and another doctor who think loose stools might be caused by metformin. Has a normal formed stool when she awakes then loose stools immediately within 1-2 hours after taking metformin that seems to be worse when she takes it wirth foos. Of note- patient take 100 mg metformin every morning instead of 500 mg twice daily.  Is willing to try lower dose metformin or higher dose Amaryl or prandin(only takes 1/2 amaryl=2 mg and pranding on an as needed basis which is not very often) , and not newer medicines such as Januvia, or GLP-1 agonists. Has been taking metformin on and off since she has been on it, with and wothout food and nothing   Weight is stable. Does not take calcium with Vit D ( encouraged this today at least noember through April), simvastatimn(because she enjoys grapefruit and desires to control with diet), and wishes to be taken off HCTZ because she does nt take it. Has not been checking blood sugars very often and recalls that fasting is problematice for her. Eats first meal between 6-9am- chick fil a 2x a week and oatmeal or grits other morning. Snacks midday on Activia yogurt and/or fruit, eats dinner 5-7 PM- meat- pork or poultry, vegetables which she loves, starch-brown , Also reports chips and cookies are problematic for her. 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 Lab reveiwed with patient- LDL is improving and still hihg at 118mg /dl and D6U is stable between 7.4 and 7.9 for the past year.  Diagnosis:  [place]ovill- altered nutrtion related lab value  as related to high LDL as evidenced by patient report of not knowing what are fats  that raise LDL.   Braman 2.3 -food medication reaction as related to eating grapefruits and being on statin as evidenced by patient report.   Intervention:  1- Discussed trial off metformin x 30 days to determiine if loose stools are being caused by metformin. Then will consider retrying it at lower dose with food ( 250mg  every other day to start) for cardiac and insulin resistance benefit if approved by Dr. Phillips Odor.) 2- Discussed and educated patient about the importance of carb consistency and educated her on carb counting of ALL foods with carbs- including healthy foods 3- Discussed and educated patient about healthy fats vs unhealthy fats, drug nutrient reaction of grapefruit and statins  as well as need for fiber to lower LDL cholesterol 4-Coordination of care with physician concerning metformin retry  Monitoring:episodes and report of number of loose stools, blood sugars. verbalizing knowledge of portions of carb choices  Evaluation:A1C, loose stools  Follow-up:2 weeks by phone, 4 weeks in clinic  Allergies: 1)  ! Pcn 2)  ! Demerol   Complete Medication List: 1)  Metformin Hcl 500 Mg Tabs (Metformin hcl) .... Two tabs by mouth twice daily 2)  Zestril 40 Mg Tabs (Lisinopril) .... Take 1 tablet by mouth once a day 3)  Calcium 600/vitamin D 600-400 Mg-unit Tabs (Calcium carbonate-vitamin d) .... Take 1 tablet by mouth two times a day 4)  Amaryl 4 Mg Tabs (Glimepiride) .... Take one (1) by mouth once a day 5)  Prandin 2  Mg Tabs (Repaglinide) .Marland Kitchen.. 1tab po three times a day by mouth tab ac as directed 6)  Voltaren 1 % Gel (Diclofenac sodium) .... Apply two times a day as directed to knees for pain 7)  Simvastatin 20 Mg Tabs (Simvastatin) .... Take 1 tablet by mouth once a day 8)  Hydrochlorothiazide 25 Mg Tabs (Hydrochlorothiazide) .... Take 1 tablet by mouth once a day 9)  Prodigy Blood Glucose Test Strp (Glucose blood) .... 90 day supply 250.00 use as directed 3 times daily  Other  Orders: MNT/Initial Visit and Intervention, 15 minutes (78469)   Orders Added: 1)  MNT/Initial Visit and Intervention, 15 minutes [62952]

## 2010-08-01 NOTE — Assessment & Plan Note (Signed)
Summary: abscess/gg   Vital Signs:  Patient profile:   70 year old female Height:      62.5 inches (158.75 cm) Weight:      252.8 pounds (114.91 kg) BMI:     45.67 Temp:     97.3 degrees F (36.28 degrees C) oral Pulse rate:   74 / minute BP sitting:   156 / 81  (left arm)  Vitals Entered By: Stanton Kidney Ditzler RN (July 04, 2010 10:36 AM) CC: itchy, draining bump under R breast Is Patient Diabetic? Yes Did you bring your meter with you today? Yes Pain Assessment Patient in pain? no      Nutritional Status BMI of > 30 = obese Nutritional Status Detail appetite good CBG Result 318  Have you ever been in a relationship where you felt threatened, hurt or afraid?denies   Does patient need assistance? Functional Status Self care Ambulation Normal Comments Past 5 days - bump under right breast with white drainage.   Primary Care Provider:  [name]  DO  CC:  itchy and draining bump under R breast.  History of Present Illness: 70yo W with DM2 and past hx of MRSA presents for evaluation of a nodule under her R breast, which she is concerned may be infected. Approximately 6 days ago, she had an itchy under her breast, which she scratched and then subsequently forgot about. Yesterday, however, the spot itched again so she looked at it in the mirror. She saw a small red bump, which expressed a small amount of white fluid when she squeezed it. It has continued to drain a small amount of white/yellow fluid for the past day. She decided to come to clinic right away because, when she had a MRSA infection in the past, she waited to seek medical therapy until the abscess was fairly large and required a painful I&D (she hopes to avoid such procedures). She denies fever, chills, or other systemic symptoms. She has also noticed a small itch on her right upper back that she has been scratching for the past week.   Of note, she saw Dorothe Pea earlier this week and agreed to a trial of stopping  metformin to see if her chronic diarrhea improves. She has instead increased her amaryl from 2mg  to 4mg  daily and has been taking Prandin 2mg  three times a day (she previously took it as needed). Her CBGs have typically been 150-200 since making this change. However, she reports eating a high carb lunch less than an hour before arriving in clinic today; she attributes today's CBG of 300 to this lunch.   Depression History:      The patient denies a depressed mood most of the day and a diminished interest in her usual daily activities.         Preventive Screening-Counseling & Management  Alcohol-Tobacco     Alcohol drinks/day: 0     Smoking Status: quit     Year Quit: 1990  Caffeine-Diet-Exercise     Does Patient Exercise: yes     Type of exercise: walking     Times/week: 2  Current Medications (verified): 1)  Zestril 40 Mg Tabs (Lisinopril) .... Take 1 Tablet By Mouth Once A Day 2)  Calcium 600/vitamin D 600-400 Mg-Unit Tabs (Calcium Carbonate-Vitamin D) .... Take 1 Tablet By Mouth Two Times A Day 3)  Amaryl 4 Mg Tabs (Glimepiride) .... Take One (1) By Mouth Once A Day 4)  Prandin 2 Mg Tabs (Repaglinide) .Marland Kitchen.. 1tab Po  Three Times A Day By Mouth Tab Ac As Directed 5)  Voltaren 1 % Gel (Diclofenac Sodium) .... Apply Two Times A Day As Directed To Knees For Pain 6)  Simvastatin 20 Mg Tabs (Simvastatin) .... Take 1 Tablet By Mouth Once A Day 7)  Hydrochlorothiazide 25 Mg Tabs (Hydrochlorothiazide) .... Take 1 Tablet By Mouth Once A Day 8)  Prodigy Blood Glucose Test  Strp (Glucose Blood) .... 90 Day Supply 250.00 Use As Directed 3 Times Daily 9)  Clindamycin Hcl 300 Mg Caps (Clindamycin Hcl) .... Take 1 Tablet By Mouth Three Times A Day  Allergies: 1)  ! Pcn 2)  ! Demerol  Past History:  Past Medical History: Last updated: 07/11/2008 Diabetes mellitus, type II Hyperlipidemia Hypertension Gout  Family History: Last updated: 10/29/2006 Mother/Father premature deaths. Father was  a alcoholic. GM was a alcoholic  Social History: Last updated: 12/13/2009 Retired Single Former Smoker - quit 34yrs ago Alcohol use-no Regular exercise-yes Does childcare work occasionally.  Review of Systems      See HPI General:  Denies chills, fever, malaise, and sweats. Resp:  Denies chest discomfort and cough. GI:  Complains of diarrhea. Derm:  Complains of itching and lesion(s). Endo:  Denies excessive thirst and excessive urination.  Physical Exam  General:  alert, cooperative to examination, and overweight-appearing.   Head:  normocephalic and atraumatic.   Eyes:  vision grossly intact, pupils equal, pupils round, and pupils reactive to light.   Mouth:  pharynx pink and moist.   Lungs:  normal breath sounds, no crackles, and no wheezes.   Heart:  normal rate, regular rhythm, no murmur, no gallop, and no rub.   Abdomen:  soft and non-tender.   Pulses:  R dorsalis pedis normal and L dorsalis pedis normal.   Extremities:  No edema.  Neurologic:  alert & oriented X3.   Skin:  Small (<1cm) erythematous nodule under R breast draining minimal amount of purulent white fluid. Small slightly erythematous patch (2-3cm in diameter) on dorsal aspect of R shoulder with mild evidence of excoriation.  Cervical Nodes:  no anterior cervical adenopathy.   Psych:  Oriented X3, memory intact for recent and remote, normally interactive, good eye contact, not anxious appearing, and not depressed appearing.    Diabetes Management Exam:    Foot Exam (with socks and/or shoes not present):       Sensory-Monofilament:          Left foot: normal          Right foot: normal   Impression & Recommendations:  Problem # 1:  ABSCESS, SKIN (ICD-682.9) Assessment New Small skin abscess under R breast draining purulent fluid. Not large enough to warrant I&D and culture would probably also be low yield because of small size and minimal drainage. Because of history of MRSA, will treat empirically with  a one week course of clindamycin. Patient advised to use warm compresses and call the clinic if the lesion does not heal or gets worse.   Her updated medication list for this problem includes:    Clindamycin Hcl 300 Mg Caps (Clindamycin hcl) .Marland Kitchen... Take 1 tablet by mouth three times a day  Problem # 2:  PRURITUS (ICD-698.9) Mild pruritic lesion on dorsal aspect of R shoulder. Patient advised to apply over-the-counter 1% hydrocortisone cream two times a day until symptoms resolve. If symptoms do not resolve or get worse, she should call the clinic.   Problem # 3:  DIABETES MELLITUS, TYPE II (ICD-250.00) Patient  has recently adjusted oral hypoglycemic agents after meeting with Jamison Neighbor earlier this week. She hopes that her diarrhea will improve by stopping metformin; to compensate for stopping metformin, she has increased Amaryl to 4mg  and is now taking Prandin three times a day. Her CBGs have been 150-200. It is currently too soon to assess the success of this new regimen. She should follow-up with her primary care doctor as previously planned.   The following medications were removed from the medication list:    Metformin Hcl 500 Mg Tabs (Metformin hcl) .Marland Kitchen..Marland Kitchen Two tabs by mouth twice daily Her updated medication list for this problem includes:    Zestril 40 Mg Tabs (Lisinopril) .Marland Kitchen... Take 1 tablet by mouth once a day    Amaryl 4 Mg Tabs (Glimepiride) .Marland Kitchen... Take one (1) by mouth once a day    Prandin 2 Mg Tabs (Repaglinide) .Marland Kitchen... 1tab po three times a day by mouth tab ac as directed  Orders: Capillary Blood Glucose/CBG (04540)  Complete Medication List: 1)  Zestril 40 Mg Tabs (Lisinopril) .... Take 1 tablet by mouth once a day 2)  Calcium 600/vitamin D 600-400 Mg-unit Tabs (Calcium carbonate-vitamin d) .... Take 1 tablet by mouth two times a day 3)  Amaryl 4 Mg Tabs (Glimepiride) .... Take one (1) by mouth once a day 4)  Prandin 2 Mg Tabs (Repaglinide) .Marland Kitchen.. 1tab po three times a day by mouth  tab ac as directed 5)  Voltaren 1 % Gel (Diclofenac sodium) .... Apply two times a day as directed to knees for pain 6)  Simvastatin 20 Mg Tabs (Simvastatin) .... Take 1 tablet by mouth once a day 7)  Hydrochlorothiazide 25 Mg Tabs (Hydrochlorothiazide) .... Take 1 tablet by mouth once a day 8)  Prodigy Blood Glucose Test Strp (Glucose blood) .... 90 day supply 250.00 use as directed 3 times daily 9)  Clindamycin Hcl 300 Mg Caps (Clindamycin hcl) .... Take 1 tablet by mouth three times a day  Patient Instructions: 1)  A prescription for an antibiotic (clindamycin) was sent to [facility] for you. Take one tablet 3 times daily for 7 days to treat your skin infection.  2)  If your skin infection fails to heal or gets worse, please call our clinic. 3)  I recommend using over-the-counter 1% hydrocortisone cream on the itchy spot on your back twice daily until symptoms improve. If it fails to improve, please call the clinic and we may consider prescribing a stronger cream.  4)  Please follow-up with your previously scheduled clinic appointments.  Prescriptions: CLINDAMYCIN HCL 300 MG CAPS (CLINDAMYCIN HCL) Take 1 tablet by mouth three times a day  #21 x 0   Entered and Authorized by:   Whitney Post MD   Signed by:   Whitney Post MD on 07/04/2010   Method used:   Electronically to        [facility] (retail)       [address]       [place], [place]  [id number]       Ph: [phone]       Fax: [phone]   RxID:   [id number]    Orders Added: 1)  Capillary Blood Glucose/CBG [82948] 2)  Est. Patient Level IV [66440]     Prevention & Chronic Care Immunizations   Influenza vaccine: Not documented   Influenza vaccine deferral: Refused  (08/27/2009)    Tetanus booster: Not documented   Td booster deferral: Deferred  (08/27/2009)  Pneumococcal vaccine: Not documented   Pneumococcal vaccine deferral: Not indicated  (08/27/2009)    H. zoster  vaccine: Not documented   H. zoster vaccine deferral: Deferred  (08/27/2009)  Colorectal Screening   Hemoccult: Not documented   Hemoccult action/deferral: Not indicated  (04/16/2009)    Colonoscopy: Normal  (07/28/2006)   Colonoscopy due: 01/2016  Other Screening   Pap smear: Not documented   Pap smear action/deferral: Not indicated S/P hysterectomy  (08/27/2009)    Mammogram: ASSESSMENT: Negative - BI-RADS 1^MM DIGITAL SCREENING  (08/20/2009)   Mammogram action/deferral: Deferred  (08/27/2009)   Mammogram due: 08/2007    DXA bone density scan: Not documented   DXA bone density action/deferral: Deferred  (08/27/2009)   Smoking status: quit  (07/04/2010)  Diabetes Mellitus   HgbA1C: 7.7  (06/18/2010)   HgbA1C action/deferral: Deferred  (08/27/2009)    Eye exam: No diabetic retinopathy.   Glaucoma suspected.   (10/03/2007)   Diabetic eye exam action/deferral: Ophthalmology referral  (10/18/2009)    Foot exam: yes  (07/04/2010)   Foot exam action/deferral: Do today   High risk foot: Yes  (07/04/2010)   Foot care education: Not documented   Foot exam due: 10/18/2008    Urine microalbumin/creatinine ratio: 3.7  (04/17/2009)    Diabetes flowsheet reviewed?: Yes   Progress toward A1C goal: Unchanged  Lipids   Total Cholesterol: 202  (01/09/2010)   LDL: 118  (01/09/2010)   LDL Direct: Not documented   HDL: 62  (01/09/2010)   Triglycerides: 112  (01/09/2010)    SGOT (AST): 18  (10/18/2009)   SGPT (ALT): 30  (10/18/2009)   Alkaline phosphatase: 78  (10/18/2009)   Total bilirubin: 0.9  (10/18/2009)    Lipid flowsheet reviewed?: Yes   Progress toward LDL goal: Unchanged  Hypertension   Last Blood Pressure: 156 / 81  (07/04/2010)   Serum creatinine: 0.67  (01/03/2010)   BMP action: Deferred   Serum potassium 4.5  (01/03/2010)    Hypertension flowsheet reviewed?: Yes   Progress toward BP goal: Unchanged  Self-Management Support :   Personal Goals (by the next  clinic visit) :     Personal A1C goal: 7  (03/21/2009)     Personal blood pressure goal: 140/90  (03/21/2009)     Personal LDL goal: 100  (03/21/2009)    Patient will work on the following items until the next clinic visit to reach self-care goals:     Medications and monitoring: check my blood sugar, check my blood pressure, bring all of my medications to every visit, weigh myself weekly, examine my feet every day  (07/04/2010)     Eating: eat more vegetables, use fresh or frozen vegetables, eat foods that are low in salt, eat fruit for snacks and desserts, limit or avoid alcohol  (07/04/2010)     Activity: take a 30 minute walk every day  (07/04/2010)     Other: congratulations on your weight loss, continue the good work  (04/16/2009)    Diabetes self-management support: Written self-care plan, Education handout, Resources for patients handout  (07/04/2010)   Diabetes care plan printed   Diabetes education handout printed   Last diabetes self-management training by diabetes educator: 12/02/2007   Last medical nutrition therapy: 05/01/2008    Hypertension self-management support: Written self-care plan, Education handout, Resources for patients handout  (07/04/2010)   Hypertension self-care plan printed.   Hypertension education handout printed    Lipid self-management support: Written self-care plan, Education handout, Resources for patients handout  (  07/04/2010)   Lipid self-care plan printed.   Lipid education handout printed      Resource handout printed.   Last LDL:                                                 118 (01/09/2010 9:36:00 PM)        Diabetic Foot Exam Foot Inspection Is there a history of a foot ulcer?              No Is there a foot ulcer now?              No Can the patient see the bottom of their feet?          Yes Are the shoes appropriate in style and fit?          Yes Is there swelling or an abnormal foot shape?          No Are the toenails long?                 No Are the toenails thick?                No Are the toenails ingrown?              No Is there heavy callous build-up?              No Is there a claw toe deformity?                          No Is there elevated skin temperature?            No Is there limited ankle dorsiflexion?            No Is there foot or ankle muscle weakness?            No Do you have pain in calf while walking?           No      Pulse Check          Right Foot          Left Foot Posterior Tibial:        2+            2+ Dorsalis Pedis:        2+            2+  High Risk Feet? Yes   10-g (5.07) Semmes-Weinstein Monofilament Test Performed by: Stanton Kidney Ditzler RN          Right Foot          Left Foot Visual Inspection     normal         normal Test Control      normal         normal Site 1         normal         normal Site 2         normal         normal Site 3         normal         normal Site 4         normal         normal Site 5  normal         normal Site 6         normal         normal Site 7         normal         normal Site 8         normal         normal Site 9         normal         normal Site 10         normal         normal  Impression      normal         normal  Appended Document: abscess/gg Dr Odis Luster and I discussed Ms Kinch and I agree with her A/P as oulined above.

## 2010-08-01 NOTE — Progress Notes (Signed)
Summary: phone/gg  Phone Note Call from Patient   Caller: Patient Summary of Call: Pt called to say Dr  Bosie Clos  is sending you a note about her reaction to metformin.  She wants you to expect it and let her know what she needs to do. Pt # [id number] Initial call taken by: Merrie Roof RN,  June 25, 2010 5:25 PM  Follow-up for Phone Call        She has been on Metformin for a while. I dont see anything in Dr. Tawni Levy  note about Metformin. Please have her schedule an appointment with Jamison Neighbor to discuss medications etc... I will work with her to come up with a tolerable regimen for her DM control. I doubt Metformin is causing her abdominal pain. Her last A1C was 7.7- we can even consider injectable options. Follow-up by: [name]  DO,  June 25, 2010 9:14 PM  Additional Follow-up for Phone Call Additional follow up Details #1::        Tried to return call.  No answer, message left Merrie Roof RN  June 26, 2010 9:15 AM   appointment scheduled with Lupita Leash.  Pt also saw Dr Bosie Clos ( GI )  the same day she saw Dr Shawnie Pons and he is the one that wanted to try her off metformin before doing a colonoscopy for diarrhea.  Pt states diarrhea clears up if she stops metformin. Additional Follow-up by: Merrie Roof RN,  June 26, 2010 10:00 AM

## 2010-08-02 ENCOUNTER — Ambulatory Visit (INDEPENDENT_AMBULATORY_CARE_PROVIDER_SITE_OTHER): Payer: Medicare Other | Admitting: Ophthalmology

## 2010-08-02 ENCOUNTER — Other Ambulatory Visit: Payer: Self-pay | Admitting: Ophthalmology

## 2010-08-02 ENCOUNTER — Encounter: Payer: Self-pay | Admitting: Ophthalmology

## 2010-08-02 DIAGNOSIS — E669 Obesity, unspecified: Secondary | ICD-10-CM

## 2010-08-02 DIAGNOSIS — I10 Essential (primary) hypertension: Secondary | ICD-10-CM

## 2010-08-02 DIAGNOSIS — N898 Other specified noninflammatory disorders of vagina: Secondary | ICD-10-CM | POA: Insufficient documentation

## 2010-08-02 DIAGNOSIS — K219 Gastro-esophageal reflux disease without esophagitis: Secondary | ICD-10-CM

## 2010-08-02 DIAGNOSIS — N899 Noninflammatory disorder of vagina, unspecified: Secondary | ICD-10-CM

## 2010-08-02 DIAGNOSIS — E119 Type 2 diabetes mellitus without complications: Secondary | ICD-10-CM

## 2010-08-02 LAB — GLUCOSE, CAPILLARY: Glucose-Capillary: 409 mg/dL — ABNORMAL HIGH (ref 70–99)

## 2010-08-02 MED ORDER — FLUCONAZOLE 100 MG PO TABS: 150.0000 mg | ORAL_TABLET | Freq: Every day | ORAL | Status: AC

## 2010-08-02 MED ORDER — INSULIN PEN NEEDLE 31G X 6 MM MISC: 15.0000 [IU] | Freq: Every day | Status: DC

## 2010-08-02 MED ORDER — INSULIN GLARGINE 100 UNIT/ML ~~LOC~~ SOLN: 15.0000 [IU] | Freq: Every day | SUBCUTANEOUS | Status: DC

## 2010-08-02 NOTE — Assessment & Plan Note (Signed)
The patient has a long history of obesity and is concerned about this, as well as the health effects of her obesity. As such, she is in the process of trying to have a LAP-BAND procedure arranged. The patient has been through all of the informational teachings, has decided to have the procedure, and is only waiting on the letter that she must write describing why she wishes to have the procedure. I instructed the patient on the health benefits of significant weight loss and stated that it would likely help improve both her blood pressure lipids and diabetes. I did however tell the patient that I could not make any guarantees that she would be free of medications even if she lost a significant amount of weight.

## 2010-08-02 NOTE — Assessment & Plan Note (Signed)
The patient has a one-week history of vaginal irritation, in the setting of increased CBGs. This is most likely a yeast infection though the patient denies any significant discharge or malodor. The patient currently denies being sexually active and has never had problems like this in the past. At this point no start the patient on Diflucan times one dose. I have instructed the patient to return to the clinic if she does not have resolution of her symptoms. Give the patient one extra prescription in case the first does not clear up her symptoms. Although the differential diagnosis also includes bacterial vaginosis, we'll treat empirically for yeast infection first.

## 2010-08-02 NOTE — Assessment & Plan Note (Signed)
The patient's blood pressure was elevated today, however the patient states that she did not take her blood pressure this morning because she wanted me to see her "raw". I instructed the patient on the importance of taking her medications prior to coming here so that we can see how well her blood pressure is controlled on her medications. The patient understands the importance of this and says that she'll do this from now on. L. not make any adjustments to the  patient's medications at this time.

## 2010-08-02 NOTE — Assessment & Plan Note (Addendum)
The patients GERD is under good control at this time. The patient was encouraged to continue to avoid triggers, avoid laying flat within two hours of a meal.  Will continue to monitor for continued control at future visits.

## 2010-08-02 NOTE — Patient Instructions (Signed)
Please continue taking your medications as you were previously prescribed and to see Lupita Leash next Tuesday. At that time he will start Lantus 15 units daily injected. I will then want to see back in one week to followup on her progress. It is very important during this time period, that she checked her blood sugars regularly especially if you feel like your developing hypoglycemic symptoms. I if her fasting blood sugars are less than 100, please call the clinic for further instructions.

## 2010-08-02 NOTE — Assessment & Plan Note (Signed)
The patient has been taking metformin since at least 2007, and has noted diarrhea since that time. The patient has also been on numerous diabetic medications in the past including Byetta and she is currently taking Amaryl and Prandin. The patient did not want to increase her Prandin as she states it has caused hypoglycemic episodes in the past. The patient stopped her metformin about 4 weeks ago and the patient's last A1c back in December 2011 was 7.7 and she appears to have been in this range for quite some time. Since discontinuing her metformin the patient has noted a gradual increase in her CBGs and they're averaging around 250, at breakfast. The patient states that she is ready to start insulin today, and I'll start her on a low dose Lantus at 0.2 units per kilogram at this time and plan to titrate up as necessary. At this point I will continue the patient's Prandin but discontinue her Amaryl. I will however tell the patient to continue these medications until she is able to followup with Jamison Neighbor on Tuesday the

## 2010-08-02 NOTE — Progress Notes (Signed)
  Subjective:    Patient ID: Katherine Christensen, female    DOB: 1941/05/04, 70 y.o.   MRN: [id number]  HPI This is a 70 year old female with a past medical history significant for type 2 diabetes, hypertension, and hyperlipidemia. Who presents after having stopped her metformin secondary to GI side effects approximately 4 weeks ago. The patient has been following closely with her PCP as well as Dr. Victory Dakin during this time. According to the patient, she started metformin back in 2007 and has been on numerous diabetes medications since that time. The patient had a colonoscopy performed in September of 2007 which was normal and she's been following with the GI physician because of her diarrhea that she's had since starting metformin. She is cut out both red meat and dairy out of her diet and this is been of no improvement. The patient has also been seen by an OB/GYN because of some associated abdominal cramping was not felt that she had anemia oriented pathology causing this. The patient states that during this time frame she's had multiple bowel movements daily and as such has missed doses of metformin. Since the patient stopped her metformin 4 weeks ago, her CBGs and gradually trended upwards and are now averaging around 250. The patient's last hemoglobin A1c was 7.7 and she states that she is willing to start Lantus today. At the end of last week, the patient also started noting vaginal itching. This has not been associated with any significant discharge abnormal bleeding or odor. The patient has never had any similar symptoms to this in the past but feels is likely secondary to her elevated glucose and feels that the yeast infection. The patient does deny any dysuria. The patient CBG today was 409 but she had just eaten the patient is also concerned about her obesity, and is in the process of applying up a LAP-BAND procedure.  The patient has been trying to maintain a healthy diet, and checks her feet  daily.  Review of Systems  Constitutional: Negative for fever and chills.  Respiratory: Negative for cough and shortness of breath.   Cardiovascular: Negative for chest pain and palpitations.  Gastrointestinal: Negative for vomiting, diarrhea and constipation.       Objective:   Physical Exam  Constitutional: She appears well-developed and well-nourished.  HENT:  Head: Normocephalic and atraumatic.  Eyes: Pupils are equal, round, and reactive to light.  Cardiovascular: Normal rate, regular rhythm and intact distal pulses.  Exam reveals no gallop and no friction rub.   No murmur heard. Pulmonary/Chest: Effort normal and breath sounds normal. She has no wheezes. She has no rales.  Abdominal: Soft. Bowel sounds are normal. She exhibits no distension. There is no tenderness.  Musculoskeletal: Normal range of motion.  Neurological: She is alert. No cranial nerve deficit.  Skin: No rash noted.          Assessment & Plan:

## 2010-08-05 ENCOUNTER — Telehealth: Payer: Self-pay | Admitting: *Deleted

## 2010-08-05 MED ORDER — INSULIN DETEMIR 100 UNIT/ML ~~LOC~~ SOLN: 15.0000 [IU] | Freq: Every day | SUBCUTANEOUS | Status: DC

## 2010-08-05 NOTE — Progress Notes (Signed)
Per patient: Medicare D? not covering Lantus, should she still come tomorrow? called [facility]: [facility] faxed note to [place] that Levemir is preferred by her insurance company- if we want that we need to change her Rx, if we want Lantus we need to call for prior authorization to  [phone] [facility] . Katherine Christensen,Katherine Christensen 08/05/2010 11:07 AM

## 2010-08-06 ENCOUNTER — Ambulatory Visit (INDEPENDENT_AMBULATORY_CARE_PROVIDER_SITE_OTHER): Payer: Medicare Other | Admitting: Dietician

## 2010-08-06 DIAGNOSIS — E119 Type 2 diabetes mellitus without complications: Secondary | ICD-10-CM

## 2010-08-06 NOTE — Progress Notes (Signed)
Diabetes Self-Management Training (DSMT)  Follow-Up 1 Visit  08/06/2010 Ms. Katherine Christensen, identified by name and date of birth, is a 70 y.o. female with Type 2 Diabetes. Year of diabetes diagnosis: 1996 Other persons present: no  ASSESSMENT Patient concerns are Medication, Healthy Lifestyle and Glycemic control.  There were no vitals taken for this visit. There is no height or weight on file to calculate BMI. No results found for this basename: [facility]   Lab Results  Component Value Date   HGBA1C 7.7 06/18/2010   Medication Nutrition Monitor: accu- chek  Labs reviewed.  DIABETES BUNDLE: A1C in past 6 months? Yes.  Less than 7%? No LDL in past year? Yes.  Less than 100 mg/dL? No Microalbumin ratio in past year? No. Patient taking ACE or ARB? Yes. Blood pressure less than 130/80? No.  Sent note to MD. Foot exam in last year? Yes. Eye exam in past year? No.  Reminded patient to schedule eye exam. Tobacco use? No. Pneumovax? Yes Flu vaccine? Yes Asprin? No  Family history of diabetes: No  Support systems: friends  Special needs: None  Prior DM Education: Yes   Medications See Medications list.  Is interested in learning more  Patients belief/attitude about diabetes: Diabetes can be controlled.  Self foot exams daily: No  Diabetes Complications: None   Exercise Plan Doing ADLs for 60 minutesa day.   Self-Monitoring Frequency of testing: 1-2 times/day Breakfast: 200-250 Lunch: 200-300 Supper: 300 Bedtime: 300  Hyperglycemia: Yes Daily Hypoglycemia: No   Meal Planning Some knowledge and Interested in improving   Assessment comments: Patient is starting insulin (Levemir 15 units at bedtime per order) because her blood sugars increased  100 mg/dl when she had to stop metformin. She has injected Byetta in past a few times and gotten insulin in the hospital.   INDIVIDUAL DIABETES EDUCATION PLAN:  Medication Acute complication: Goal  setting _______________________________________________________________________  Intervention TOPICS COVERED TODAY:  Medication  Taught/evaluated insulin injection, site rotation, insulin storage and needle disposal. Reviewed patients medication for diabetes, action, purpose, timing of dose and side effects.  PATIENTS GOALS/PLAN (copy and paste in patient instructions so patient receives a copy): 1.  Learning Objective:    Inject insulin once daily, monitor blood sugar once daily.  2.  Behavioral Objective:         Medications: To improve blood glucose levels, I will take my medication as prescribed Never 0% Problem Solving: To improve my blood glucose control, I will check my blood sugar every morning and bring my meter and logbook to clinic visits.  Half of the time 50%  Personalized Follow-Up Plan for Ongoing Self Management Support:  Doctor's Office, friends and CDE visits ______________________________________________________________________   Outcomes Expected outcomes: Demonstrated interest in learning.Expect positive changes in lifestyle.  Self-care Barriers: None  Education material provided: instructions on how to use Levemir pen  Patient to contact team via Phone if problems or questions.  Time in: 0900     Time out: 0930  Future DSMT - 4-6 wks  patient to follow up with Dr. Cathey Endow next week, CDE by phone at the end of this week and then CDE in office in 4 weeks       RILEY,Ichiro Chesnut

## 2010-08-07 ENCOUNTER — Other Ambulatory Visit: Payer: Self-pay | Admitting: Dietician

## 2010-08-07 MED ORDER — "INSULIN SYRINGE 31G X 5/16"" 0.3 ML MISC": 1.0000 | Freq: Every day | Status: DC

## 2010-08-07 NOTE — Progress Notes (Signed)
Summary: ready for next step to control blood sugars/dmr  Phone Note Call from Patient Call back at Home Phone [phone]   Caller: Patient Summary of Call: "okay, I concede" still off metformin, doing well except slight headache from high blood sugars: ready to discuss medication to assist her with lowering blood sugar- in class today until 2 PM. whatever we suggest- ready to do what's next except not sure about insulin..."  will route to Dr. Phillips Odor for input. I believe patient had previously not wanted to try any of the newer medications such as Victoza or Januvia, so that would leave insulin. ? Lantus?  Initial call taken by: Jamison Neighbor RD,CDE,  July 22, 2010 9:36 AM  Follow-up for Phone Call        spoke with patient today- she wants to get blood sugars down (overnight and mornings 250s) earlier than her appoitnment in April with Dr. Phillips Odor. She is willing to try lantus pens and was transferred to make an appointment with Dr. Phillips Odor or another doctor soon. Follow-up by: Jamison Neighbor RD,CDE,  July 23, 2010 3:33 PM  Additional Follow-up for Phone Call Additional follow up Details #1::        I agree with insulin- would be great to start witrh Lantus qPM. I am comfortable writing a script for her now if you want to do the education/in person part. Let me know what you think- weight based dosing with instructions for titration and I can see her in April :) Additional Follow-up by: [name]  DO,  July 29, 2010 10:41 AM    Additional Follow-up for Phone Call Additional follow up Details #2::    I called Shaundra and left message for her ot call to arrange a time to meet with CDE Follow-up by: Jamison Neighbor RD,CDE,  August 01, 2010 4:17 PM

## 2010-08-07 NOTE — Telephone Encounter (Addendum)
Patient called to tell us about how she did with first insulin injection. Her CBG was 406 last night, stayed with friends, feels great this morning.... CBG was 330 in the middle of the night, 225 this am.   Didn't have rx for syringes- will go to purchase syringes. Left sample of 10 syringes for patient to pick up and will request Rx be sent to [facility] for syringes

## 2010-08-07 NOTE — Telephone Encounter (Signed)
Script sent  

## 2010-08-08 ENCOUNTER — Other Ambulatory Visit: Payer: Self-pay | Admitting: Dietician

## 2010-08-08 DIAGNOSIS — D239 Other benign neoplasm of skin, unspecified: Secondary | ICD-10-CM

## 2010-08-08 NOTE — Telephone Encounter (Signed)
Called [facility] for Part D health plan phone number [phone]). They informed me that the Levemir Flexpen  is not covered by her health plan and that a prior authorization was not requested. ( meaning they will not cover the pen).   Called patient to inform her of this. Suggested she call her part D plan to find out what types of insulin  covered and preferred. If no pens covered then she could think about a part D plan that has insulin pens on their formulary.

## 2010-08-09 MED ORDER — INSULIN DETEMIR 100 UNIT/ML ~~LOC~~ SOLN: 15.0000 [IU] | Freq: Every day | SUBCUTANEOUS | Status: DC

## 2010-08-09 NOTE — Telephone Encounter (Signed)
Opened in error

## 2010-08-09 NOTE — Telephone Encounter (Signed)
Addended by: Anderson Malta on: 08/09/2010 11:47 AM   Modules accepted: Orders

## 2010-08-15 ENCOUNTER — Encounter: Payer: Self-pay | Admitting: Ophthalmology

## 2010-08-15 ENCOUNTER — Ambulatory Visit (INDEPENDENT_AMBULATORY_CARE_PROVIDER_SITE_OTHER): Payer: Medicare Other | Admitting: Ophthalmology

## 2010-08-15 VITALS — BP 122/68 | HR 74 | Temp 97.7°F | Ht 63.0 in | Wt 248.4 lb

## 2010-08-15 DIAGNOSIS — H40059 Ocular hypertension, unspecified eye: Secondary | ICD-10-CM

## 2010-08-15 DIAGNOSIS — E119 Type 2 diabetes mellitus without complications: Secondary | ICD-10-CM

## 2010-08-15 DIAGNOSIS — I10 Essential (primary) hypertension: Secondary | ICD-10-CM

## 2010-08-15 DIAGNOSIS — N898 Other specified noninflammatory disorders of vagina: Secondary | ICD-10-CM

## 2010-08-15 DIAGNOSIS — N899 Noninflammatory disorder of vagina, unspecified: Secondary | ICD-10-CM

## 2010-08-15 DIAGNOSIS — Z0000 Encounter for general adult medical examination without abnormal findings: Secondary | ICD-10-CM | POA: Insufficient documentation

## 2010-08-15 LAB — GLUCOSE, CAPILLARY: Glucose-Capillary: 318 mg/dL — ABNORMAL HIGH (ref 70–99)

## 2010-08-15 MED ORDER — REPAGLINIDE 2 MG PO TABS: 2.0000 mg | ORAL_TABLET | Freq: Three times a day (TID) | ORAL | Status: DC

## 2010-08-15 NOTE — Progress Notes (Signed)
Subjective:    Patient ID: Katherine Christensen, female    DOB: 01-07-1941, 70 y.o.   MRN: [id number]  HPI This is a 70 year old female with a past medical history significant for type 2 diabetes, hyperlipidemia, and hypertension who presents for followup of her diabetes.  At her last visit, the patient had been off of her metformin (secondary to GI intolerance)  for approximately 6 weeks and has noted a gradual increase in her blood sugar.  As such, she was started on 15 units of Levemir daily. In the interim the patient has had followup with Mrs. Victory Dakin for further diabetes education. The patient was also concerned about vaginal irritation at her last visit, and Diflucan was initiated.  Since her last visit,  The patient has noted a gradual decrease in her fasting CBGs. And had a lowest level this morning of 163. The patient has been taking all of her medications regularly and has  Had no problems injecting her insulin. The patient denies any hypoglycemic episodes. The patient states that her vaginal irritation has completely resolved. The patient also complains of some mild muscle tenderness over her sternum that is worsened by movement of her arms and she states that she feels this is because she slept wrong.  Review of Systems  Constitutional: Negative for fever and chills.  Respiratory: Negative for cough and shortness of breath.   Cardiovascular: Negative for chest pain and palpitations.  Gastrointestinal: Negative for vomiting, diarrhea and constipation.       Objective:   Physical Exam  Constitutional: She appears well-developed and well-nourished.  HENT:  Head: Normocephalic and atraumatic.  Eyes: Pupils are equal, round, and reactive to light.        Patient appears to have a normal cup to disc ratio, on funduscopic exam did not note any significant changes of diabetic retinopathy  Cardiovascular: Normal rate, regular rhythm and intact distal pulses.  Exam reveals no gallop and no friction  rub.   No murmur heard. Pulmonary/Chest: Effort normal and breath sounds normal. She has no wheezes. She has no rales.  Abdominal: Soft. Bowel sounds are normal. She exhibits no distension. There is no tenderness.  Musculoskeletal: Normal range of motion.  Neurological: She is alert. No cranial nerve deficit.  Skin: No rash noted.        Current Outpatient Prescriptions on File Prior to Visit  Medication Sig Dispense Refill  . Calcium Carbonate-Vitamin D (CALCIUM 600/VITAMIN D) 600-400 MG-UNIT per tablet Take 1 tablet by mouth 2 (two) times daily.        . diclofenac sodium (VOLTAREN) 1 % GEL Apply two times a day as directed to knees for pain.       . hydrochlorothiazide 25 MG tablet Take 25 mg by mouth daily.        . insulin detemir (LEVEMIR FLEXPEN) 100 UNIT/ML injection Inject 15 Units into the skin at bedtime.  10 mL  6  . Insulin Pen Needle (EXEL PEN NEEDLES 31GX1/4") 31G X 6 MM MISC 15 Units by Does not apply route daily.  100 each  6  . Insulin Syringe-Needle U-100 (INSULIN SYRINGE .3CC/31GX5/16") 31G X 5/16" 0.3 ML MISC Inject 1 each into the skin daily.  100 each  1  . lisinopril (PRINIVIL,ZESTRIL) 40 MG tablet Take 40 mg by mouth daily.        . repaglinide (PRANDIN) 2 MG tablet Take 2 mg by mouth 3 (three) times daily before meals.        Marland Kitchen  simvastatin (ZOCOR) 20 MG tablet Take 20 mg by mouth at bedtime.           Past Medical History  Diagnosis Date  . Arrhythmia   . Mild anemia   . Osteoarthritis   . Female pelvic pain   . Mole (skin)   . Borderline glaucoma   . GERD (gastroesophageal reflux disease)   . Type 2 diabetes mellitus   . Hypertension   . Obesity   . Hyperlipidemia   . Sleep apnea   . MRSA (methicillin resistant staph aureus) culture positive   . Systolic murmur   . Hemorrhoids      Assessment & Plan:

## 2010-08-15 NOTE — Assessment & Plan Note (Addendum)
At this point, I will continue the patient on her Prandin and Levemir. I would like to increase the dose of levemir to 18 units to try to improve the patient's glycemic control.  The patient should be followed up in one month to repeat her A1c. I will instruct the patient that if she notices fasting blood sugars greater than 200 for 2 consecutive nights, she can increase her Levemir by one unit. The patient should call the clinic if she is taking 20 or more units of Levemir for further instructions.  The patient should be seen in one month for hemoglobin A1c in for further management of her insulin.

## 2010-08-15 NOTE — Assessment & Plan Note (Signed)
The patients blood pressure was within reasonable control today (BP: 122/68 mmHg ) and I will not make any adjustments to the patients anti-hypertensive regimen. I will continue to monitor and titrate the patients medications as needed at future visits.

## 2010-08-15 NOTE — Assessment & Plan Note (Signed)
The patient is scheduled for mammography next week and had an eye exam which she reports as normal last week.

## 2010-08-15 NOTE — Patient Instructions (Signed)
I am going to increase her Levemir to 18 units to try to get better control of her diabetes. If your blood sugars are greater than 200 fasting 2 mornings in a row,  Please increase her Levemir by one unit daily.  Please call the clinic for further structures if you're taking 20 units or more daily. I like to see back in one month to check your A1c.

## 2010-08-15 NOTE — Assessment & Plan Note (Signed)
The patient's all her optometrist on February 5. The patient reports that she had a normal exam and her ocular pressure was normal. The patient was scheduled by their office, for one-year followup. On funduscopic exam today, cup-to-disc ratio appeared normal,  And there were not significant diabetic retinopathy changes.

## 2010-08-15 NOTE — Assessment & Plan Note (Signed)
This has resolved with a single dose of Diflucan. Patient currently denies any further vaginal irritation or discharge.

## 2010-08-20 ENCOUNTER — Telehealth: Payer: Self-pay | Admitting: *Deleted

## 2010-08-20 NOTE — Telephone Encounter (Signed)
Pt called stating she went to a sleepover and slept on left side on Saturday.  When she got home she noted about 20 pumps on left side under bra.  She is c/o itching to that area, on and off.  Bumps are raised and red.  No pus noted.  Rash are in rectangle about the size of her hand.  Today no change, not any worse.  She has not taken any meds. Also pt started on insulin 1 1/2 weeks ago.  Pt # [id number]

## 2010-08-21 NOTE — Telephone Encounter (Signed)
Strange. Could be shingles, scabies, contact dermatitis.... I think someone needs to look at it to know for sure. Would be a quick appointment. Let me see where we could add her on.

## 2010-08-21 NOTE — Telephone Encounter (Signed)
Dr. Reche Dixon will see her since this will be quick.

## 2010-08-21 NOTE — Telephone Encounter (Signed)
Ok I suspect if they were shingles they would be very painful. Probably contact and will resolve on their own.

## 2010-08-21 NOTE — Telephone Encounter (Signed)
Pt unable to come in this AM.  States bumps have scabbed over. She will call if not better.

## 2010-08-22 ENCOUNTER — Ambulatory Visit
Admission: RE | Admit: 2010-08-22 | Discharge: 2010-08-22 | Disposition: A | Discharge status: Home / Self Care | Payer: Medicare Other | Source: Ambulatory Visit | Attending: Internal Medicine | Admitting: Internal Medicine

## 2010-08-22 DIAGNOSIS — Z1239 Encounter for other screening for malignant neoplasm of breast: Secondary | ICD-10-CM

## 2010-08-29 ENCOUNTER — Telehealth: Payer: Self-pay | Admitting: Dietician

## 2010-08-29 DIAGNOSIS — E119 Type 2 diabetes mellitus without complications: Secondary | ICD-10-CM

## 2010-08-29 NOTE — Telephone Encounter (Signed)
Patient called concerned because her blood sugars are not in target and she is up to 19 units Levemir daily. Her fasting blood sugar today was 186  and lowest it has been is 163 mg/dl. She says she was told to increase her insulin 1 unit each day but she is afraid that it is too much insulin. Discussed that usual dose is 22-23 at least for her bodyweight and lifestyle changes such as walking more and watching what she eats will help her get to target 90-130 fasting blood sugar quicker and help her decrease weight and be healthier.   Patient agreed to continue to increase insulin 1 unit each day until 2/3 fasting blood sugars are between 90-130. She also agreed to call us to tell us how much insulin she is taking to achieve her target. Updated her medication list to 19 units a day.

## 2010-08-29 NOTE — Telephone Encounter (Addendum)
Patient called back again today reporting that she forgot to tell us that she is getting red, swollen areas that itch every time she injects her insulin.  Saw dermatologist today and asked [facility] who  referred her to [place] thinking she may be allergic to something in the insulin solution.  We could try her on Lantus sample and if she has no problems with it , then request a prior authorization from her insurance company.

## 2010-08-30 NOTE — Telephone Encounter (Signed)
Called patient this morning and questioned if she could be allergic to something in the pen needles or syringes she is using. She confirmed that she is allergic to nickel and is using an off brand needle. She will switch to different needle - CDE suggested BD- for the next week to see if this makes a difference. Nickel added to patient allergies

## 2010-09-02 NOTE — Telephone Encounter (Signed)
Patient called this morning to say that she is now up to 20 units of Levemir and her blood sugar this morning was 163, and that Monoject insulin syringes seem to work and not cause a red, swollen itchy bump after injectoin, but not BD or any others. She will keep [place] informed.

## 2010-09-04 ENCOUNTER — Ambulatory Visit (INDEPENDENT_AMBULATORY_CARE_PROVIDER_SITE_OTHER): Payer: Medicare Other | Admitting: Dietician

## 2010-09-04 ENCOUNTER — Other Ambulatory Visit: Payer: Self-pay | Admitting: Dietician

## 2010-09-04 DIAGNOSIS — E119 Type 2 diabetes mellitus without complications: Secondary | ICD-10-CM

## 2010-09-04 MED ORDER — INSULIN GLARGINE 100 UNIT/ML ~~LOC~~ SOLN: 20.0000 [IU] | Freq: Every day | SUBCUTANEOUS | Status: DC

## 2010-09-04 NOTE — Patient Instructions (Signed)
Please inject only on right side with saline and only on left side using lantus.  Continue to check our blood sugar once daily and increase your insulin as previously instructed. (1 unit each day until fasting blood sugars mostly between 90 and 130.  Call in 2 weeks to let us know how you are doing with lantus insulin.

## 2010-09-04 NOTE — Telephone Encounter (Signed)
Discussed problems with insulin administruation with patient: she says every injection site is red and irritated looking and itches a lot despite using different syringes. Options are to try a different insulin or could try the I-Port( which we have samples of) , but the inserter needle is probably also stainless steel. It is my understanding that all stainless steel contains nickel. Will look to see if we have samples of lantus.  Have scheduled patient with CDE for 11 AM today and will discuss with attending physician.

## 2010-09-04 NOTE — Progress Notes (Signed)
Diabetes Self-Management Training (DSMT)  Follow up visit 2  09/04/2010 Ms. Katherine Christensen, identified by name and date of birth, is a 70 y.o. female with Type 2 Diabetes. Year of diabetes diagnosis: 1996 Other persons present: no  ASSESSMENT Patient concerns are Medication, Healthy Lifestyle and Glycemic control.  There were no vitals taken for this visit. There is no height or weight on file to calculate BMI. No results found for this basename: [facility]   Lab Results  Component Value Date   HGBA1C 7.7 06/18/2010   Medication Nutrition Monitor: accu- chek Labs reviewed.  DIABETES BUNDLE: Address lipids at future visit Eye exam in past year? Will ask at follow up if she has scheduled  Support systems: friends Special needs: No   Medications See Medications list. Brought levemir vial. It appears clear and she has used ~2/3- half since 08/06/10. Patient demonstrated saline injection in upper abdomen with correct technique today.  Is interested in learning more   Self-Monitoring Frequency of testing: 1-2 times/day Breakfast:160-200 range   Meal Planning Some knowledge and Interested in improving   Assessment comments: Patient is changing to sample of lantus due to red areas around injection sites. Unsure if this is due to something in insulin vs syringes. .   INDIVIDUAL DIABETES EDUCATION PLAN:  Medication Acute complication: Goal setting _______________________________________________________________________  Intervention TOPICS COVERED TODAY:  Medication  Taught/evaluated insulin injection, site rotation, insulin storage and needle disposal. Reviewed patients medication for diabetes, action, purpose, timing of dose and side effects.  PATIENTS GOALS/PLAN (copy and paste in patient instructions so patient receives a copy): 1.  Learning Objective:    Inject insulin once daily, monitor blood sugar once daily.  2.  Behavioral Objective:         Medications: To improve  blood glucose levels, I will take my medication as prescribed Never 0% Problem Solving: To improve my blood glucose control, I will check my blood sugar every morning and bring my meter and logbook to clinic visits.  Half of the time 50%   Is now doing all 3 of the above 100% of the time.  Personalized Follow-Up Plan for Ongoing Self Management Support:  Doctor's Office, friends and CDE visits ______________________________________________________________________   Outcomes Expected outcomes: Demonstrated interest in learning.Expect positive changes in lifestyle.  Self-care Barriers: None  Education material provided: instructions on how to use Levemir pen  Patient to contact team via Phone if problems or questions.  Time in: 1100     Time out: 1130  Future DSMT       RILEY,Decklan Mau

## 2010-09-04 NOTE — Progress Notes (Signed)
  Subjective:    Patient ID: Katherine Christensen, female    DOB: Jun 04, 1941, 70 y.o.   MRN: [id number]  HPI  Would try an experiment: administer saline on one side, lantus on the other, and record which is which Then record what happens.  Review of Systems     Objective:   Physical Exam        Assessment & Plan:

## 2010-09-04 NOTE — Progress Notes (Signed)
Addended by: Donia Guiles on: 09/04/2010 11:58 AM   Modules accepted: Orders, Level of Service

## 2010-09-04 NOTE — Telephone Encounter (Signed)
Attending physician saw patient during her visit with CDE today

## 2010-09-05 NOTE — Telephone Encounter (Signed)
Will continue to follow. Agree with plan.

## 2010-09-09 LAB — GLUCOSE, CAPILLARY: Glucose-Capillary: 139 mg/dL — ABNORMAL HIGH (ref 70–99)

## 2010-09-10 MED ORDER — INSULIN DETEMIR 100 UNIT/ML ~~LOC~~ SOLN: 20.0000 [IU] | Freq: Every day | SUBCUTANEOUS | Status: DC

## 2010-09-12 ENCOUNTER — Other Ambulatory Visit: Payer: Self-pay | Admitting: Dietician

## 2010-09-12 NOTE — Telephone Encounter (Signed)
Patient returned call to let us know side of her abdomen where she used only saline injections is fine with no redness or itching. She continue to take lantus as instructed to control her blood sugar- fasting today was 180mg /dl.  She is willing to retry incretins or sulfonylureas if needed- says she previously did not give either one of those a fair chance to work because she did not take as precsribed.  Her next appointment is 10/10/10

## 2010-09-12 NOTE — Telephone Encounter (Signed)
Left message for patient to let us know if the irritated areas are on one side of her abdomen or both- she was injection lantus on one side and saline on the other.

## 2010-09-15 LAB — CBC
MCHC: 33.7 g/dL (ref 30.0–36.0)
MCV: 88.2 fL (ref 78.0–100.0)
Platelets: 222 10*3/uL (ref 150–400)
RBC: 4.34 MIL/uL (ref 3.87–5.11)
RDW: 12.6 % (ref 11.5–15.5)

## 2010-09-15 LAB — GLUCOSE, CAPILLARY: Glucose-Capillary: 160 mg/dL — ABNORMAL HIGH (ref 70–99)

## 2010-09-15 LAB — BASIC METABOLIC PANEL
BUN: 6 mg/dL (ref 6–23)
CO2: 27 mEq/L (ref 19–32)
Calcium: 9.1 mg/dL (ref 8.4–10.5)
Chloride: 103 mEq/L (ref 96–112)
Creatinine, Ser: 0.6 mg/dL (ref 0.4–1.2)
Glucose, Bld: 201 mg/dL — ABNORMAL HIGH (ref 70–99)

## 2010-09-16 ENCOUNTER — Other Ambulatory Visit: Payer: Self-pay | Admitting: Internal Medicine

## 2010-09-16 DIAGNOSIS — E119 Type 2 diabetes mellitus without complications: Secondary | ICD-10-CM

## 2010-09-16 NOTE — Telephone Encounter (Signed)
Wants to retry metformin- has had it with insulin, itching really bad from insulin. Did not take insulin yesterday or Saturday, wants to ease back into metformin, amaryl and prandin if needed and not take insulin any longer if okay with her doctor.   We discussed a plan to try metformin 250-500 mg once daily with food and her old dose amaryl and prandin only if needed,  Told patient I will page dr. Phillips Odor and get back to her. She was going to call [facility] to see if they had an active metformin prescription.

## 2010-09-16 NOTE — Telephone Encounter (Signed)
Was not able to get in touch with Dr. Phillips Odor - will route to attending for approval.

## 2010-09-16 NOTE — Telephone Encounter (Signed)
Dr. Phillips Odor is here on B-service today.  This does not appear to be very urgent, therefore, will forward to PCP.

## 2010-09-17 LAB — BRAIN NATRIURETIC PEPTIDE: Pro B Natriuretic peptide (BNP): <30 pg/mL (ref 0.0–100.0)

## 2010-09-17 LAB — GLUCOSE, CAPILLARY: Glucose-Capillary: 84 mg/dL (ref 70–99)

## 2010-09-18 LAB — GLUCOSE, CAPILLARY
Glucose-Capillary: 135 mg/dL — ABNORMAL HIGH (ref 70–99)
Glucose-Capillary: 137 mg/dL — ABNORMAL HIGH (ref 70–99)
Glucose-Capillary: 140 mg/dL — ABNORMAL HIGH (ref 70–99)
Glucose-Capillary: 90 mg/dL (ref 70–99)

## 2010-09-18 LAB — CBC
HCT: 34.7 % — ABNORMAL LOW (ref 36.0–46.0)
HCT: 40.4 % (ref 36.0–46.0)
Hemoglobin: 11.6 g/dL — ABNORMAL LOW (ref 12.0–15.0)
MCHC: 33.1 g/dL (ref 30.0–36.0)
MCHC: 33.6 g/dL (ref 30.0–36.0)
MCV: 88.2 fL (ref 78.0–100.0)
Platelets: 201 10*3/uL (ref 150–400)
RBC: 3.93 MIL/uL (ref 3.87–5.11)
WBC: 6.4 10*3/uL (ref 4.0–10.5)

## 2010-09-18 LAB — HEMOGLOBIN A1C: Mean Plasma Glucose: 166 mg/dL

## 2010-09-18 MED ORDER — METFORMIN HCL 500 MG PO TABS: ORAL_TABLET | ORAL | Status: DC

## 2010-09-18 MED ORDER — SITAGLIPTIN PHOSPHATE 100 MG PO TABS: 100.0000 mg | ORAL_TABLET | Freq: Every day | ORAL | Status: AC

## 2010-09-18 NOTE — Telephone Encounter (Signed)
She needs to come in for BMET. Her renal function has been normal but I want to make sure. I also think someone needs to look at these areas of inflammation or allergic reaction and document the history completely before sending her to an allergist. Katherine Christensen is reliable but she really dislikes being on insulin and injectables in general I think. I will start with a trial of Januvia-will see what her insurance covers. I have her scheduled for the 12th

## 2010-10-04 LAB — CBC
Platelets: 204 10*3/uL (ref 150–400)
RDW: 12.7 % (ref 11.5–15.5)

## 2010-10-04 LAB — BASIC METABOLIC PANEL
CO2: 29 mEq/L (ref 19–32)
Calcium: 9.4 mg/dL (ref 8.4–10.5)
Creatinine, Ser: 0.59 mg/dL (ref 0.4–1.2)
GFR calc Af Amer: >60 mL/min (ref >60)
Glucose, Bld: 113 mg/dL — ABNORMAL HIGH (ref 70–99)

## 2010-10-10 ENCOUNTER — Ambulatory Visit (INDEPENDENT_AMBULATORY_CARE_PROVIDER_SITE_OTHER): Payer: Medicare Other | Admitting: Internal Medicine

## 2010-10-10 ENCOUNTER — Encounter: Payer: Self-pay | Admitting: Internal Medicine

## 2010-10-10 DIAGNOSIS — E785 Hyperlipidemia, unspecified: Secondary | ICD-10-CM

## 2010-10-10 DIAGNOSIS — H40059 Ocular hypertension, unspecified eye: Secondary | ICD-10-CM

## 2010-10-10 DIAGNOSIS — K649 Unspecified hemorrhoids: Secondary | ICD-10-CM

## 2010-10-10 DIAGNOSIS — D279 Benign neoplasm of unspecified ovary: Secondary | ICD-10-CM

## 2010-10-10 DIAGNOSIS — R197 Diarrhea, unspecified: Secondary | ICD-10-CM

## 2010-10-10 DIAGNOSIS — E119 Type 2 diabetes mellitus without complications: Secondary | ICD-10-CM

## 2010-10-10 DIAGNOSIS — D649 Anemia, unspecified: Secondary | ICD-10-CM

## 2010-10-10 DIAGNOSIS — G4733 Obstructive sleep apnea (adult) (pediatric): Secondary | ICD-10-CM

## 2010-10-10 DIAGNOSIS — I10 Essential (primary) hypertension: Secondary | ICD-10-CM

## 2010-10-10 LAB — COMPREHENSIVE METABOLIC PANEL
ALT: 12 U/L (ref 0–35)
Albumin: 4.3 g/dL (ref 3.5–5.2)
CO2: 25 mEq/L (ref 19–32)
Calcium: 9.7 mg/dL (ref 8.4–10.5)
Chloride: 98 mEq/L (ref 96–112)
Glucose, Bld: 238 mg/dL — ABNORMAL HIGH (ref 70–99)
Potassium: 4.6 mEq/L (ref 3.5–5.3)
Sodium: 134 mEq/L — ABNORMAL LOW (ref 135–145)
Total Bilirubin: 1 mg/dL (ref 0.3–1.2)
Total Protein: 7.5 g/dL (ref 6.0–8.3)

## 2010-10-10 LAB — CBC WITH DIFFERENTIAL/PLATELET
Eosinophils Absolute: 0.1 10*3/uL (ref 0.0–0.7)
Hemoglobin: 14.4 g/dL (ref 12.0–15.0)
Lymphocytes Relative: 37 % (ref 12–46)
Lymphs Abs: 2.1 10*3/uL (ref 0.7–4.0)
MCH: 29.6 pg (ref 26.0–34.0)
MCV: 88.3 fL (ref 78.0–100.0)
Monocytes Relative: 6 % (ref 3–12)
Neutrophils Relative %: 55 % (ref 43–77)
RBC: 4.86 MIL/uL (ref 3.87–5.11)
WBC: 5.9 10*3/uL (ref 4.0–10.5)

## 2010-10-10 LAB — LIPID PANEL
Cholesterol: 216 mg/dL — ABNORMAL HIGH (ref 0–200)
VLDL: 17 mg/dL (ref 0–40)

## 2010-10-10 LAB — GLUCOSE, CAPILLARY: Glucose-Capillary: 263 mg/dL — ABNORMAL HIGH (ref 70–99)

## 2010-10-10 MED ORDER — LISINOPRIL-HYDROCHLOROTHIAZIDE 20-12.5 MG PO TABS: 1.0000 | ORAL_TABLET | Freq: Every day | ORAL | Status: DC

## 2010-10-10 MED ORDER — REPAGLINIDE 2 MG PO TABS: 2.0000 mg | ORAL_TABLET | Freq: Three times a day (TID) | ORAL | Status: DC

## 2010-10-10 NOTE — Assessment & Plan Note (Signed)
Has been taking half of her Lisinopril and using her HCTZ prn. Blood Pressure is under good control. Near goal. SBP 138. Will change her to combo HCTZ and Lisinopril.

## 2010-10-10 NOTE — Assessment & Plan Note (Signed)
Had had complete evaluation, felt to be secondary to Metformin, has improved with lower doses but still intermittent.

## 2010-10-10 NOTE — Assessment & Plan Note (Signed)
A1C increasing-7.7 at last visit. 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 Metformin causing diarrhea but we are slowly increasing her dose. Currently she is taking once daily- will continue for now and very slowly. After one week will increase to twice daily. Will add januvia and stop amaryl because she is having some problems with edema. She can continue to take Prandin once daily in the morning with breakfast and will increase as tolerated. Since she cannot take insulin at this point and doesn't want to try we will try to maximize oral meds- but this is not ideal and I discussed this with her in detail.

## 2010-10-10 NOTE — Assessment & Plan Note (Signed)
Had eye exam in February. Very mild glaucoma. Asymptomatic.

## 2010-10-10 NOTE — Assessment & Plan Note (Signed)
Next Colonoscopy due 02/2016. No active issues.

## 2010-10-10 NOTE — Assessment & Plan Note (Signed)
Not using CPAP. Considering getting back on it but has not used it in over a year. At next visit will schedule a re-titration.

## 2010-10-10 NOTE — Assessment & Plan Note (Signed)
Can consider Welchol. Has not been on statin in months. Check panel today.

## 2010-10-10 NOTE — Assessment & Plan Note (Signed)
Will repeat CBC.

## 2010-10-14 ENCOUNTER — Telehealth: Payer: Self-pay | Admitting: *Deleted

## 2010-10-14 LAB — GLUCOSE, CAPILLARY: Glucose-Capillary: 208 mg/dL — ABNORMAL HIGH (ref 70–99)

## 2010-10-14 NOTE — Telephone Encounter (Signed)
Pt calls stating that her lis/hctz is too much for her, she cannot stop going to bathroom, states she had forgotten that appr 11yrs ago she was in a "core study"  and had problems then with it, she will not take today, please advise... Appt? Change med?

## 2010-10-16 ENCOUNTER — Telehealth (INDEPENDENT_AMBULATORY_CARE_PROVIDER_SITE_OTHER): Payer: Medicare Other | Admitting: *Deleted

## 2010-10-16 DIAGNOSIS — I10 Essential (primary) hypertension: Secondary | ICD-10-CM

## 2010-10-16 MED ORDER — ROSUVASTATIN CALCIUM 20 MG PO TABS: 20.0000 mg | ORAL_TABLET | ORAL | Status: DC

## 2010-10-16 NOTE — Telephone Encounter (Signed)
Pt called back today asking what should she do about Lisinopril/HCTZ. Stated she has not taken any medication since the weekend.  She wants to know if should only be Lisinopril?  Need new Rx sent to pharmacy.   Telephone # [phone]

## 2010-10-16 NOTE — Telephone Encounter (Signed)
I discussed this at length with her at her last appointment. The HCTZ is a very low dose on the new combo script I gave her at her last OV  and the lisinopril is half the previous dose-it just doesn't make sense. I am starting think there are major issues with how she is taking her medicine. I would like her to continue the combo pill for at least two weeks daily-the whole pill-she like to take half of what I prescribe.I f she is still having problems with "peeing too much" then we will stop the HCTZ and just put her on the Lisinopril. Remind her that she was previously on double the dose of both at her last visit with me. The peeing will settle down and she should have great BP control. Lets make an appointment with any provider in 2 weeks just for a quick blood pressure and lab check if possible and she is willing. Thanks!

## 2010-10-16 NOTE — Telephone Encounter (Signed)
Pt was called and informed how to take Lisinopril/HCTZ per Dr. Phillips Odor instruction. But she stated she will not take it until Friday since it effects her job b/c he cannot be in the bathroom constantly and she has to drive to [employer] and she works at night. Stated from Sunday until Tuesday, she was in the bathroom every hour; afraid of becoming dehydrated.  But she agree to scheduled an appt.

## 2010-10-16 NOTE — Telephone Encounter (Signed)
Pt called and wanted you to know she stopped taking lisinopril-HCTZ because it was making her void too much.  She took for one day and it made her increase urine for 3 days.  She thinks the dose of HCTZ ( 12.5 mg ) is too much. Will you change to a lower dose?  Pt [phone]

## 2010-10-17 ENCOUNTER — Ambulatory Visit: Payer: Medicare Other | Admitting: Ophthalmology

## 2010-10-17 MED ORDER — LISINOPRIL 20 MG PO TABS: 20.0000 mg | ORAL_TABLET | Freq: Every day | ORAL | Status: DC

## 2010-10-21 ENCOUNTER — Ambulatory Visit: Payer: Medicare Other | Admitting: Family Medicine

## 2010-10-21 DIAGNOSIS — R1084 Generalized abdominal pain: Secondary | ICD-10-CM

## 2010-10-22 ENCOUNTER — Encounter: Payer: Self-pay | Admitting: *Deleted

## 2010-10-22 NOTE — Group Therapy Note (Signed)
NAME:  Katherine Christensen, Katherine Christensen NO.:  [id number]  MEDICAL RECORD NO.:  [id number]           PATIENT TYPE:  A  LOCATION:  [facility]                   FACILITY:  [facility]  PHYSICIAN:  Tinnie Gens, MD        DATE OF BIRTH:  02/04/1941  DATE OF SERVICE:  10/21/2010                                 CLINIC NOTE  CHIEF COMPLAINT:  Right lower quadrant pain.  HISTORY OF PRESENT ILLNESS:  The patient is a 70 year old obese, diabetic, hypertensive, elevated cholesterol patient who is basically status post TAH and BSO on separate occasions with normal path on her BSO.  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  She comes in today for right lower quadrant pain for several months.  She wonders if it could be related to her previous surgery and in fact her last surgery was over a year ago. It is a sort of nagging pain she can touch it down, still deep.  On exam, her vitals are as noted in the chart.  Blood pressure is 140/72.  She is an obese female, in no acute distress.  In the right lower quadrant, she has point tenderness.  In the right lower quadrant above the incision 3 mL of lidocaine were injected there until it took away her pain.  So I am suspicious that this will leave a scar tissue or something else musculoskeletal that is causing her pain.  IMPRESSION:  Abdominal pain of unclear etiology, probable musculoskeletal pain may be related to incision.  PLAN:  May use Tylenol or ibuprofen as needed.          ______________________________ Tinnie Gens, MD    TP/MEDQ  D:  10/21/2010  T:  10/22/2010  Job:  [id number]

## 2010-11-12 NOTE — Group Therapy Note (Signed)
NAME:  ETHER, GOEBEL NO.:  [id number]   MEDICAL RECORD NO.:  [id number]          PATIENT TYPE:  WOC   LOCATION:  [facility]                   FACILITY:  [facility]   PHYSICIAN:  Tinnie Gens, MD        DATE OF BIRTH:  10-24-1940   DATE OF SERVICE:  11/16/2008                                  CLINIC NOTE   CHIEF COMPLAINT:  Evaluate and treat ovarian cyst.  [facility] referral.   HISTORY OF PRESENT ILLNESS:  The patient is a 70 year old nullipara who  has history of a hysterectomy for a fibroid uterus in 1982.  She was  previously treated for infertility.  The patient reports that in helping  to take care of a baby who is the child of one of her patients from  [facility], she had noticed a twinge in her side that did not seem to  get much better.  The patient then discussed this with her primary care  physician who ordered a pelvic sonogram.  The sonogram showed a right  ovary that had a 5.5 x 4.2 x 4.6 complex cyst with internal debris and  slight mural irregularity, but no septations.  There were small  calcifications evident in the left ovary without a well-defined mass.  Left ovary appeared to be normal and, as stated previously, she is  without uterus.  That sonogram was done September 05, 2008.  She has had no  follow-up since then.  She thinks the cyst is still there.  She still  having that twinge on the right side.  She is without other symptoms of  bloating or abdominal discomfort.   PAST MEDICAL HISTORY:  Significant for diabetes, history of a heart  murmur, hypertension and elevated cholesterol which she treats with diet  alone.   PAST SURGICAL HISTORY:  She had a D and C in 1965, a laparoscopy, then a  hysterectomy and a breast biopsy in 1996.   MEDICATIONS:  1. Metformin 1000 mg p.o. b.i.d.  2. Amaryl 4 mg p.o. daily.  3. Zestril 20 mg p.o. daily.  4. Tylenol as needed.   ALLERGIES:  PENICILLIN.   OBSTETRICAL HISTORY:  She is  G0.   GYN HISTORY:  Last Pap was approximately 7 years ago.  No history of  abnormal Paps.  She had a mammogram in February of this year which was  normal.  She underwent colonoscopy in September 2007 which was also  normal.   FAMILY HISTORY:  Significant for hypertension, diabetes and oral cancer  in her grandmother.   The patient is retired.  She currently works for [employer] and  volunteers at [employer] and at [employer] from downtown.   A 14-point review of systems is reviewed.  Please see GYN history in the  chart.  She basically reports night sweats on occasion.   EXAM:  Her vitals are as noted in the chart.  She is an obese female in  no acute distress.  Her weight is 253 pounds.  Blood pressure is 145/68.  ABDOMEN:  Soft, nontender, nondistended.  GU:  Normal external female genitalia.  BUS is normal.  Vagina is pale  with loss of rugation.  Cervix and uterus are surgically absent.  Adnexal evaluation was limited by body habitus.   IMPRESSION:  Complex right ovarian cyst that needs further evaluation  and treatment.  We will follow up her ultrasound to see if this is  growing and if she needs a referral to gynecologic oncologist and obtain  a CA 125 today.  We will follow up in 2 weeks for results of this and  posting.           ______________________________  Tinnie Gens, MD     TP/MEDQ  D:  11/16/2008  T:  11/16/2008  Job:  [id number]

## 2010-11-12 NOTE — Group Therapy Note (Signed)
NAME:  Katherine Christensen, Katherine Christensen NO.:  [id number]   MEDICAL RECORD NO.:  [id number]          PATIENT TYPE:  WOC   LOCATION:  [facility]                   FACILITY:  [facility]   PHYSICIAN:  Allie Bossier, MD        DATE OF BIRTH:  07/11/1940   DATE OF SERVICE:                                  CLINIC NOTE   HISTORY:  Katherine Christensen is a 70 year old divorced, nullipara, who was seen  here on Nov 16, 2008 as a [facility] referral.  At  that time, this young lady complained of a twinge in her right side  that did not go away.  She discussed it with her primary care physician  who ordered a pelvic ultrasound.  The sonogram showed a right ovary with  a 5.5 x 4.2 x 4.6 complex cyst with internal debris and slight mural  irregularity.  Left ovary appeared normal.  STAT ultrasound was done  September 05, 2008.  A CA-125 was normal at 4.5.  Ultrasound done on Nov 21, 2008, showed no change.  Excision should be considered was in the  report.   PAST MEDICAL HISTORY:  1. Adult-onset diabetes.  2. Hypertension.  3. Elevated cholesterol.  4. Obesity.   REVIEW OF SYSTEMS:  She is divorced.  Abstinent for the last 7 years.  She is retired from the [employer] at [employer].  She had a  mammogram done February 2010.  Her last colonoscopy was September 2007  and is up to date.   PAST SURGICAL HISTORY:  1. Laparoscopy for infertility.  2. Hysterectomy (TAH) for fibroids.  3. D and C.  4. Breast biopsy.   ALLERGIES:  1. LATEX.  2. PENICILLIN.   MEDICATIONS:  1. Metformin 1000 mg b.i.d.  2. Amaryl 4 mg daily.  3. Zestril 20 mg daily.  4. Tylenol as necessary.   ASSESSMENT/PLAN:  Asymptomatic 5.5 centimeter ovarian cyst.  I would  suggest laparoscopic removal of this.  I will discuss this with the GYN  Oncologist to make sure they do not prefer to have her operated on  there.      Allie Bossier, MD     MCD/MEDQ  D:  12/14/2008  T:  12/14/2008  Job:  [id number]

## 2010-11-15 NOTE — Procedures (Signed)
NAME:  Katherine Christensen, CASADOS NO.:  [id number]   MEDICAL RECORD NO.:  [id number]          PATIENT TYPE:  OUT   LOCATION:  S[facility] CENTER                 FACILITY:  [facility]   PHYSICIAN:  Clinton D. Maple Hudson, M.D. DATE OF BIRTH:  [birth year]   DATE OF STUDY:  12/15/2004                              NOCTURNAL POLYSOMNOGRAM   REFERRING PHYSICIAN:  Dr. Lowella Bandy.   INDICATION FOR STUDY:  Hypersomnia with sleep apnea.   EPWORTH SLEEPINESS SCORE:  8/24.   BODY MASS INDEX:  48.   WEIGHT:  266 pounds.   SLEEP ARCHITECTURE:  Total sleep time:  240 minutes with sleep efficiency at  57%.  Stage I 27%, stage II 56%, stages II and IV 9%, REM 80% of total sleep  time.  Sleep latency 14 minutes, REM latency 310 minutes.  Awake after sleep  onset 164 minutes.  Arousal index increased at 95.  There was sustained  wakefulness for much of the interval between 1:30 and 3:45 a.m.  No bedtime  medications taken.   RESPIRATORY DATA:  Respiratory disturbance index (RDI, AHI):  26.5  obstructive events per hour, indicating moderate obstructive sleep  apnea/hypopnea syndrome.  There were 3 central apneas, 5 obstructive apneas  and 98 hypopneas.  Events were not positional.  REM RDI 30, which is  increased.  CPAP titration to 15 CWP for best control of snoring and events.  A ResMed Swift with medium nasal pillows was used.   OXYGEN DATA:  Moderate snoring with oxygen desaturation to a nadir of 89%.  Oxygenation of final CPAP is 96% on room air.   CARDIAC DATA:  Normal sinus rhythm.   MOVEMENT/PARASOMNIA:  Occasional leg jerks with arousal.   IMPRESSION:  1.  Moderate obstructive sleep apnea/hypopnea syndrome, respiratory      disturbance index 26.5 per hour with moderate snoring and oxygen      desaturation to 89%.  2.  Continuous positive airway pressure titration to 15 centimeters of water      pressure using a ResMed Swift with medium nasal pillows and heated      humidifier.      Clinton D. Maple Hudson, M.D.  Diplomat    CDY/MEDQ  D:  12/22/2004 13:09:31  T:  12/23/2004 04:50:58  Job:  [id number]

## 2010-11-15 NOTE — Op Note (Signed)
Katherine Christensen, Katherine Christensen NO.:  [id number]   MEDICAL RECORD NO.:  [id number]          PATIENT TYPE:  AMB   LOCATION:  ENDO                         FACILITY:  [facility]   PHYSICIAN:  Shirley Friar, MDDATE OF BIRTH:  1940-07-24   DATE OF PROCEDURE:  02/25/2006  DATE OF DISCHARGE:                                 OPERATIVE REPORT   INDICATIONS:  Screening.   MEDICATIONS:  Fentanyl 100 mcg IV, Versed 10 mg IV.   FINDINGS:  Rectal exam revealed external hemorrhoids, nonthrombosed.  An  adult adjustable colonoscope was inserted into a well prepped colon and  advanced to the cecum where the ileocecal valve and appendiceal orifice were  identified.  On careful withdrawal the colonoscope revealed no mucosal  abnormalities.  Retroflexion was done which revealed small internal  hemorrhoids.  The colonoscope was withdrawn to confirm the above findings.   ASSESSMENT:  Internal and external hemorrhoids otherwise normal colonoscopy.   PLAN:  1. Repeat colonoscopy in 10 years.  2. High fiber diet.      Shirley Friar, MD  Electronically Signed     VCS/MEDQ  D:  02/25/2006  T:  02/25/2006  Job:  [id number]   cc:   Dr. Renae Fickle

## 2010-12-20 ENCOUNTER — Other Ambulatory Visit (INDEPENDENT_AMBULATORY_CARE_PROVIDER_SITE_OTHER): Payer: Medicare Other | Admitting: *Deleted

## 2010-12-20 DIAGNOSIS — E119 Type 2 diabetes mellitus without complications: Secondary | ICD-10-CM

## 2010-12-23 MED ORDER — METFORMIN HCL 500 MG PO TABS: ORAL_TABLET | ORAL | Status: DC

## 2011-01-02 ENCOUNTER — Telehealth: Payer: Self-pay | Admitting: *Deleted

## 2011-01-02 NOTE — Telephone Encounter (Signed)
Pt calls c/o of "boil type" lesions coming and going on labia, outer vaginal area and anal area, ongoing for 1 month, denies fevers. Has not done anything to alleviate or aggravate. Also c/o area on L shin that is itchy, no redness, not swelling, no open area, did use anti- itch cream to no avail. appt given for Monday at 0845. Pt is cautioned to got to Ed or urg care if becomes worse or worried, she is agreeable

## 2011-01-02 NOTE — Telephone Encounter (Signed)
Noted. Agree.

## 2011-01-06 ENCOUNTER — Ambulatory Visit (INDEPENDENT_AMBULATORY_CARE_PROVIDER_SITE_OTHER): Payer: Medicare Other | Admitting: Internal Medicine

## 2011-01-06 ENCOUNTER — Encounter: Payer: Self-pay | Admitting: Internal Medicine

## 2011-01-06 VITALS — BP 136/72 | HR 71 | Temp 97.9°F | Ht 62.0 in | Wt 244.6 lb

## 2011-01-06 DIAGNOSIS — E785 Hyperlipidemia, unspecified: Secondary | ICD-10-CM

## 2011-01-06 DIAGNOSIS — L299 Pruritus, unspecified: Secondary | ICD-10-CM

## 2011-01-06 DIAGNOSIS — I10 Essential (primary) hypertension: Secondary | ICD-10-CM

## 2011-01-06 DIAGNOSIS — E119 Type 2 diabetes mellitus without complications: Secondary | ICD-10-CM

## 2011-01-06 LAB — GLUCOSE, CAPILLARY: Glucose-Capillary: 274 mg/dL — ABNORMAL HIGH (ref 70–99)

## 2011-01-06 MED ORDER — ROSUVASTATIN CALCIUM 20 MG PO TABS: 20.0000 mg | ORAL_TABLET | Freq: Every day | ORAL | Status: DC

## 2011-01-06 MED ORDER — EXENATIDE 5 MCG/0.02ML ~~LOC~~ SOPN: 5.0000 ug | PEN_INJECTOR | Freq: Two times a day (BID) | SUBCUTANEOUS | Status: DC

## 2011-01-06 MED ORDER — FLUCONAZOLE 100 MG PO TABS: ORAL_TABLET | ORAL | Status: DC

## 2011-01-06 NOTE — Assessment & Plan Note (Addendum)
LDL: 141, is not at target as seen on lipid panel in April 2012.  Patient reports not taking Crestor and thinks she can control with just diet.  I spoke to her about the importance of tight  cholesterol control in diabetic patients because they have an accelerated atherosclerotic process than normal people.   -Start Crestor 20mg  po qhs -Continue diet and exercise -Consider repeat lipid panel in several months

## 2011-01-06 NOTE — Assessment & Plan Note (Signed)
Adequately controlled.  She is not compliant with hctz because of polyuria. -Will continue lisinopril for now

## 2011-01-06 NOTE — Assessment & Plan Note (Signed)
Likely from her uncontrolled DM, yeast infection.  She reports immediate relief from diflucan each time she was prescribed for her yeast infection.  Her CBC and CMET were within normal limit in April which rule out cholestasis, ESRD, anemia.  Other differential include thyroid or malignancy.  If she continues to have pruritis after her sugar is controlled, we will check TFTs.   -Will prescribe Diflucan today, patient is to take 1 tablet today and if still itching, she may take another tablet in 1 week

## 2011-01-06 NOTE — Patient Instructions (Signed)
We will refer you to endocrinology Please stop taking Prandin Start taking Byetta twice daily Start taking Crestor 20mg  one tablet at bed time, it is extremely important that we control your cholesterol Please follow up with Dr. Phillips Odor in 2-3 months

## 2011-01-06 NOTE — Progress Notes (Signed)
HPI: 70 yo woman with a very complicated medication regimen/medication non-compliance for her DM presents to clinic today requesting for an endocrinology referral.  She states that she tried multiple oral regimens as well as Lantus/Levemir with adverse effects.  She has a long history of GI side effects with metformin and rash reaction with Levemir/Lantus.  In the past few months, she reports increase vaginal itching, polyuria, and boils which already resolved at time of visit.  Denies any dysuria, burning, vaginal discharge, or malodor.  She repeatedly states that "I know this is all due to my high blood sugar".  Currently, she titrated her Metformin to 1500mg  daily + Imodium and Prandin 2mg  tid.  She is not taking her hctz because she felt like it was the cause of her polyuria.  She has not started her Crestor as prescribed by Dr. Phillips Odor because she thinks that she can control with her diet.  Denies any numbness or tingling, or any vision changes.  ROS: as per HPI  PE: General: alert, well-developed, and cooperative to examination.  Lungs: normal respiratory effort, no accessory muscle use, normal breath sounds, no crackles, and no wheezes. Heart: normal rate, regular rhythm, no murmur, no gallop, and no rub.  Abdomen: obese, soft, non-tender, normal bowel sounds, no distention, no guarding, no rebound tenderness, no hepatomegaly, and no splenomegaly.  Msk: no joint swelling, no joint warmth, and no redness over joints.  Pulses: 2+ DP/PT pulses bilaterally Extremities: No cyanosis, clubbing, +1-2 non-pitting edema Neurologic: alert & oriented X3, cranial nerves II-XII intact, strength normal in all extremities, sensation intact to light touch, and gait normal.  Skin: turgor normal and no rashes.

## 2011-01-06 NOTE — Assessment & Plan Note (Addendum)
Poorly controlled.  HbA1c today is 10.0 which trended up from 7.7 in feb 2012.  CBG is 274.  Patient has a history of non-compliance and in denial about her condition.  I am not sure if she has been taking her medications as instructed given the multiple side effects of the medications.   -Will refer to Endocrinology as requested by patient -Will start Byetta bid -Continue Metformin as tolerated  Case discussed with her PCP, Dr. Phillips Odor

## 2011-02-03 ENCOUNTER — Telehealth: Payer: Self-pay | Admitting: *Deleted

## 2011-02-03 NOTE — Telephone Encounter (Signed)
Talked to insurance company [phone] Byetta 5 - denied. Pt needs to try Actos or Avandia first then repeat A1C. Flag sent to Dr Anselm Jungling.Stanton Kidney Cornelius Marullo RN 02/03/11 10:50AM

## 2011-05-26 ENCOUNTER — Other Ambulatory Visit: Payer: Self-pay | Admitting: Internal Medicine

## 2011-05-26 NOTE — Telephone Encounter (Signed)
E-scribe not working; Visual merchandiser rx faxed to [facility].

## 2011-05-26 NOTE — Telephone Encounter (Signed)
E-scribe not working; Educational psychologist rx faxed to [facility].

## 2011-05-27 ENCOUNTER — Other Ambulatory Visit: Payer: Self-pay | Admitting: Internal Medicine

## 2011-05-27 NOTE — Telephone Encounter (Signed)
Prescription was called to [facility].  Angelina Ok, RN 05/27/2011 11:57 AM

## 2011-05-27 NOTE — Telephone Encounter (Signed)
Prescription was called to [facility].  Angelina Ok, RN 05/27/2011 11:55 PM

## 2011-06-11 ENCOUNTER — Other Ambulatory Visit: Payer: Self-pay | Admitting: *Deleted

## 2011-06-11 DIAGNOSIS — L299 Pruritus, unspecified: Secondary | ICD-10-CM

## 2011-06-11 MED ORDER — FLUCONAZOLE 100 MG PO TABS: ORAL_TABLET | ORAL | Status: DC

## 2011-06-11 NOTE — Telephone Encounter (Signed)
Pt c/o vaginal itching 

## 2011-06-20 ENCOUNTER — Ambulatory Visit (INDEPENDENT_AMBULATORY_CARE_PROVIDER_SITE_OTHER): Payer: Medicare Other | Admitting: Family Medicine

## 2011-06-20 ENCOUNTER — Ambulatory Visit (INDEPENDENT_AMBULATORY_CARE_PROVIDER_SITE_OTHER): Payer: Medicare Other | Admitting: Internal Medicine

## 2011-06-20 ENCOUNTER — Encounter: Payer: Self-pay | Admitting: Internal Medicine

## 2011-06-20 VITALS — BP 143/72 | HR 90 | Temp 100.0°F | Ht 62.0 in | Wt 245.2 lb

## 2011-06-20 VITALS — BP 175/78 | Temp 100.8°F

## 2011-06-20 DIAGNOSIS — E785 Hyperlipidemia, unspecified: Secondary | ICD-10-CM

## 2011-06-20 DIAGNOSIS — E119 Type 2 diabetes mellitus without complications: Secondary | ICD-10-CM

## 2011-06-20 DIAGNOSIS — IMO0002 Reserved for concepts with insufficient information to code with codable children: Secondary | ICD-10-CM

## 2011-06-20 DIAGNOSIS — J069 Acute upper respiratory infection, unspecified: Secondary | ICD-10-CM | POA: Insufficient documentation

## 2011-06-20 DIAGNOSIS — E669 Obesity, unspecified: Secondary | ICD-10-CM

## 2011-06-20 DIAGNOSIS — Z79899 Other long term (current) drug therapy: Secondary | ICD-10-CM

## 2011-06-20 DIAGNOSIS — M159 Polyosteoarthritis, unspecified: Secondary | ICD-10-CM

## 2011-06-20 DIAGNOSIS — M1710 Unilateral primary osteoarthritis, unspecified knee: Secondary | ICD-10-CM

## 2011-06-20 DIAGNOSIS — M179 Osteoarthritis of knee, unspecified: Secondary | ICD-10-CM | POA: Insufficient documentation

## 2011-06-20 LAB — POCT GLYCOSYLATED HEMOGLOBIN (HGB A1C): Hemoglobin A1C: 9.8

## 2011-06-20 LAB — GLUCOSE, CAPILLARY: Glucose-Capillary: 218 mg/dL — ABNORMAL HIGH (ref 70–99)

## 2011-06-20 MED ORDER — ROSUVASTATIN CALCIUM 20 MG PO TABS: 10.0000 mg | ORAL_TABLET | Freq: Every day | ORAL | Status: DC

## 2011-06-20 MED ORDER — CELECOXIB 200 MG PO CAPS: 200.0000 mg | ORAL_CAPSULE | Freq: Two times a day (BID) | ORAL | Status: DC

## 2011-06-20 MED ORDER — AZITHROMYCIN 250 MG PO TABS: ORAL_TABLET | ORAL | Status: AC

## 2011-06-20 NOTE — Progress Notes (Signed)
  Subjective:    Patient ID: Katherine Christensen, female    DOB: Jul 09, 1940, 70 y.o.   MRN: [id number]  HPI This patient comes in for followup of her right knee pain. She has no medial compartmental, as well as patellofemoral degenerative joint disease. She is nearly bone-on-bone based on x-ray from 2011. She had a steroid injection in April of this year, this lasted about 2 months. She's had injections in the past. She has never had Visco supplementation. She's also looking to do for knee replacement until May of 2013.  Pain continuous, worse with activity especially walking and stair climbing which is VERY painful. 8/10 worst. Tylenol not helpful Review of Systems    no fevers, chills, night sweats, weight loss. Objective:   Physical Exam General:  Well developed, well nourished, and in no acute distress. Neuro:  Alert and oriented x3, extra-ocular muscles intact. Skin: Warm and dry. Respiratory:  Not using accessory muscles, speaking in full sentences. Right knee: Medial joint line pain, range of motion 3 to 130. LCL, MCL, PCL, anterior cruciate ligament all stable. Negative McMurray's. Angulates with an antalgic gait. No overlying erythema or induration.  Lateral wedge placed in sports insole into the right shoe. Gait much improved, patient reports greater than 20% improvement in pain with wedge in place.   XRAYS reviewed with the patient: : essentially bone on bone medial compartment.  Official Report:RIGHT KNEE - 1-2 VIEW  Comparison: Two views right knee 11/29/2006.  Findings: The patient has advanced tricompartmental degenerative  disease with bone-on-bone joint space narrowing of the medial and  patellofemoral compartments. Bulky osteophytosis is present.  Osteoarthritis shows some progression. There is no fracture or  joint effusion.  IMPRESSION:  Some worsening of advanced degenerative disease about the knee  appearing worst in the medial compartment.   Assessment & Plan:    Right knee DJD: Deferring steroid injection today, as patient does have a low-grade fever. She has an appointment with her primary care physician to discuss this. I did offer her sports insole the lateral wedge to unload the medial compartment. We will also order Visco supplementation, Supartz. She is a perfect candidate in that she is failing steroid injections, and wants to defer knee replacement until at least May of 2013. We will call the patient back to start the series. It would be once per week for 5 weeks. I would also like her to try some Celebrex in the meantime.  Could also consider offloading brace I spent greater than 25 minutes with this patient, 50% was face-to-face counseling and education regarding knee replacement.

## 2011-06-20 NOTE — Patient Instructions (Addendum)
1.  For your cough  - Start Azithromycin 250 mg tablets,  Take 2 tablets today and then 1 tablet daily after that.  - Salt water gargles or cough drops for the throat pain.  - You can use OTC cough medication for the cough  - Ibuprofen is okay to use for the fever or pain.  2.  Restart the Crestor at 10 mg daily for your cholesterol.  Remember the goal is to keep that LDL cholesterol below 100.   - Diet and exercise will help both diabetes and cholesterol.  3.  Continue all of your other medications as prescribed.  4.  Follow up in about 2 months to meet your new primary care doctor.

## 2011-06-20 NOTE — Patient Instructions (Signed)
Great to see you, Starting celebrex daily. Special insole. Come back  (We will call you) so we can start your viscosupplementation/lubricant injections (1x per week for 5 wks)  Katherine Christensen. Katherine Christensen, M.D. [facility] [address] [place] [address] [phone]

## 2011-06-20 NOTE — Progress Notes (Signed)
Patient ID: Katherine Christensen, female   DOB: 1941/01/02, 70 y.o.   MRN: [id number] In for routine follow-up. See problem list for related details.

## 2011-06-20 NOTE — Progress Notes (Signed)
Subjective:   Patient ID: Katherine Christensen female   DOB: Jun 13, 1941 70 y.o.   MRN: [id number]  HPI: Katherine Christensen is a 70 y.o. woman who presents to clinic today complaining of ear pain that started Tuesday.  She states that it continued and then seems to have moved to her chest yesterday.  She also states she had a fever yesterday.  She took her temperature at home and it as was 99.9 yesterday, this morning 100.2, and then 100.8 at [facility] today.  She states that she has been eating and drinking okay.  She also states she has had body aches yesterday as well as a cough with yellowish sputum production and post nasal drip with sneezing. She denies sinus pressure, diarrhea, or constipation.  She has taken ibuprofen and cough drops which helped some.   She states that she has been taking her other medications as prescribed.  She does need refills of her metformin and Crestor today.  She is also due for a HgBA1C.  She denies any polyuria, polydipsia, or blurry vision.    Past Medical History  Diagnosis Date  . Arrhythmia   . Mild anemia   . Osteoarthritis   . Female pelvic pain   . Mole (skin)   . Borderline glaucoma   . GERD (gastroesophageal reflux disease)   . Type 2 diabetes mellitus   . Hypertension   . Obesity   . Hyperlipidemia   . Sleep apnea   . MRSA (methicillin resistant staph aureus) culture positive   . Systolic murmur   . Hemorrhoids    Current Outpatient Prescriptions  Medication Sig Dispense Refill  . ACCU-CHEK AVIVA PLUS test strip TEST BLOOD GLUCOSE TWICE DAILY  100 strip  3  . celecoxib (CELEBREX) 200 MG capsule Take 1 capsule (200 mg total) by mouth 2 (two) times daily.  60 capsule  3  . exenatide (BYETTA) 5 MCG/0.02ML SOLN Inject 0.02 mLs (5 mcg total) into the skin 2 (two) times daily with a meal.  1.2 mL  3  . fluconazole (DIFLUCAN) 100 MG tablet Take one tablet today for yeast infection, if you still have itching, you can take another tablet in 1 week  2  tablet  0  . GNP LANCETS MICRO THIN 33G MISC TEST BLOOD GLUCOSE TWICE DAILY  100 each  3  . lisinopril (PRINIVIL,ZESTRIL) 20 MG tablet Take 1 tablet (20 mg total) by mouth daily.  30 tablet  11  . metFORMIN (GLUCOPHAGE) 500 MG tablet Take 1/2 to 1 tablet twice daily with food as directed.  60 tablet  0  . rosuvastatin (CRESTOR) 20 MG tablet Take 1 tablet (20 mg total) by mouth at bedtime.  30 tablet  11   Family History  Problem Relation Age of Onset  . Alcohol abuse Father    History   Social History  . Marital Status: Single    Spouse Name: N/A    Number of Children: N/A  . Years of Education: N/A   Social History Main Topics  . Smoking status: Former Smoker    Types: Cigarettes    Quit date: 07/02/1988  . Smokeless tobacco: None  . Alcohol Use: None  . Drug Use: None  . Sexually Active: None   Other Topics Concern  . 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  Review of Systems: Negative except as noted in the HPI.   Objective:  Physical Exam: Filed Vitals:   06/20/11 1544  BP: 143/72  Pulse: 90  Temp: 100 F (37.8 C)  TempSrc: Oral  Height: 5\' 2"  (1.575 m)  Weight: 245 lb 3.2 oz (111.222 kg)   Constitutional: Vital signs reviewed.  Patient is a well-developed and well-nourished woman in no acute distress and cooperative with exam. Alert and oriented x3.  Head: Normocephalic and atraumatic, sinus pain to palpation.  Ear: TM mildly swollen bilaterally with no erythema.  Mouth: no erythema or exudates, MMM Eyes: PERRL, EOMI, conjunctivae normal, No scleral icterus.  Nose: bilateral turbinate erythema noted with posterior drainage. Neck: Supple, Trachea midline normal ROM, No JVD, mass, thyromegaly, or carotid bruit present.  Cardiovascular: RRR, S1 normal, S2 normal, no MRG, pulses symmetric and intact  bilaterally Pulmonary/Chest: CTAB, no wheezes, rales, or rhonchi Abdominal: Soft. Non-tender, non-distended, bowel sounds are normal, no masses, organomegaly, or guarding present.  GU: no CVA tenderness Musculoskeletal: No joint deformities, erythema, or stiffness, ROM full and no nontender Hematology: no cervical, inginal, or axillary adenopathy.  Skin: Warm, dry and intact. No rash, cyanosis, or clubbing.  Psychiatric: Normal mood and affect. speech and behavior is normal. Judgment and thought content normal. Cognition and memory are normal.   Assessment & Plan:

## 2011-07-09 ENCOUNTER — Ambulatory Visit (INDEPENDENT_AMBULATORY_CARE_PROVIDER_SITE_OTHER): Payer: Medicare Other | Admitting: Sports Medicine

## 2011-07-09 DIAGNOSIS — M1710 Unilateral primary osteoarthritis, unspecified knee: Secondary | ICD-10-CM

## 2011-07-09 DIAGNOSIS — M179 Osteoarthritis of knee, unspecified: Secondary | ICD-10-CM

## 2011-07-09 NOTE — Progress Notes (Signed)
  Subjective:    Patient ID: Katherine Christensen, female    DOB: [birth year], 71 y.o.   MRN: [id number]  HPI This patient comes in for Supartz injection #1 of her right knee.   Review of Systems  Negative with regards to the chief complaint    Objective:   Physical Exam  Consent obtained and verified. Time-out conducted. Noted no overlying erythema, induration, or other signs of local infection. Skin prepped in a sterile fashion. Topical analgesic spray: Ethyl chloride. Joint: Right knee Needle: 25-gauge Completed without difficulty. Needle advanced under real time ultrasound guidance into the suprapatellar recess. Meds: 25 mg/2.5 mL of Supartz. 5 cc of lidocaine 1% Pain immediately improved suggesting accurate placement of the medication. Advised to call if fevers/chills, erythema, induration, drainage, or persistent bleeding.       Assessment & Plan:   1: Right knee degenerative joint disease: Supartz injection as above, she will come back for Supartz #2 in one week. This will be a 5 injection series.

## 2011-07-15 ENCOUNTER — Ambulatory Visit (INDEPENDENT_AMBULATORY_CARE_PROVIDER_SITE_OTHER): Payer: Medicare Other | Admitting: Sports Medicine

## 2011-07-15 DIAGNOSIS — M1710 Unilateral primary osteoarthritis, unspecified knee: Secondary | ICD-10-CM

## 2011-07-15 DIAGNOSIS — M179 Osteoarthritis of knee, unspecified: Secondary | ICD-10-CM

## 2011-07-15 DIAGNOSIS — IMO0002 Reserved for concepts with insufficient information to code with codable children: Secondary | ICD-10-CM

## 2011-07-15 NOTE — Progress Notes (Addendum)
  Subjective:    Patient ID: Katherine Christensen, female    DOB: 03-Oct-1940, 71 y.o.   MRN: [id number]  HPI  This patient comes in for Supartz injection #2 of her right knee.   Review of Systems   Negative with regards to the chief complaint    Objective:   Physical Exam Procedure: Supartz injection Consent obtained and verified. Time-out conducted. Noted no overlying erythema, induration, or other signs of local infection. Skin prepped in a sterile fashion. Topical analgesic spray: Ethyl chloride. Joint: Right knee Completed without difficulty. Meds: 25 mg/2.5 mL of Supartz (sodium hyaluronate). Advised to call if fevers/chills, erythema, induration, drainage, or persistent bleeding.    Assessment & Plan:   1: Right knee degenerative joint disease: Supartz injection as above, she will come back for Supartz #3 in one week. This will be a 5 injection series.

## 2011-07-22 ENCOUNTER — Ambulatory Visit (INDEPENDENT_AMBULATORY_CARE_PROVIDER_SITE_OTHER): Payer: Medicare Other | Admitting: Sports Medicine

## 2011-07-22 ENCOUNTER — Encounter: Payer: Self-pay | Admitting: Sports Medicine

## 2011-07-22 VITALS — BP 157/70 | HR 80

## 2011-07-22 DIAGNOSIS — IMO0002 Reserved for concepts with insufficient information to code with codable children: Secondary | ICD-10-CM

## 2011-07-22 DIAGNOSIS — M1710 Unilateral primary osteoarthritis, unspecified knee: Secondary | ICD-10-CM

## 2011-07-22 DIAGNOSIS — M179 Osteoarthritis of knee, unspecified: Secondary | ICD-10-CM

## 2011-07-22 NOTE — Patient Instructions (Signed)
Great to see you Katherine Christensen,  Supartz injection done today, come back for your fourth shot in one week.   Ihor Austin. [name], M.D. [facility] [address], [place] [address] [phone]

## 2011-07-22 NOTE — Progress Notes (Addendum)
  Subjective:    Patient ID: Katherine Christensen, female    DOB: Jul 03, 1940, 71 y.o.   MRN: [id number]  HPI  This patient comes in for Supartz injection #3 of her right knee.   Review of Systems   Negative with regards to the chief complaint    Objective:   Physical Exam Procedure: Supartz injection Consent obtained and verified. Time-out conducted. Noted no overlying erythema, induration, or other signs of local infection. Skin prepped in a sterile fashion. Topical analgesic spray: Ethyl chloride. Joint: Right knee Completed without difficulty. Meds: 25 mg/2.5 mL of Supartz (sodium hyaluronate). Advised to call if fevers/chills, erythema, induration, drainage, or persistent bleeding.    Assessment & Plan:   1: Right knee degenerative joint disease: Supartz injection as above, she will come back for Supartz #4 in one week. This will be a 5 injection series.

## 2011-07-29 ENCOUNTER — Ambulatory Visit (INDEPENDENT_AMBULATORY_CARE_PROVIDER_SITE_OTHER): Payer: Medicare Other | Admitting: Sports Medicine

## 2011-07-29 VITALS — BP 147/81 | HR 85

## 2011-07-29 DIAGNOSIS — M1710 Unilateral primary osteoarthritis, unspecified knee: Secondary | ICD-10-CM

## 2011-07-29 DIAGNOSIS — IMO0002 Reserved for concepts with insufficient information to code with codable children: Secondary | ICD-10-CM

## 2011-07-29 DIAGNOSIS — M1711 Unilateral primary osteoarthritis, right knee: Secondary | ICD-10-CM

## 2011-07-29 NOTE — Progress Notes (Addendum)
  Subjective:    Patient ID: Katherine Christensen, female    DOB: [birth year], 71 y.o.   MRN: [id number]  HPI  This patient comes in for Supartz injection #4 of her right knee. She is extremely happy with the results thus far, and notes that she found herself dancing one day without pain. She walks much better as well. She is actually asking to have the other knee done.  Review of Systems   Negative with regards to the chief complaint    Objective:   Physical Exam  Procedure: Supartz injection Consent obtained and verified. Time-out conducted. Noted no overlying erythema, induration, or other signs of local infection. Skin prepped in a sterile fashion. Topical analgesic spray: Ethyl chloride. Joint: Right knee Completed without difficulty. Meds: 25 mg/2.5 mL of Supartz (sodium hyaluronate). Advised to call if fevers/chills, erythema, induration, drainage, or persistent bleeding.      Assessment & Plan:   1: Right knee degenerative joint disease: Supartz injection as above, she will come back for Supartz #5 in one week. This will be a 5 injection series.

## 2011-07-29 NOTE — Assessment & Plan Note (Signed)
Injection as above. Return to see me in a week for #5.

## 2011-08-01 ENCOUNTER — Encounter: Payer: Self-pay | Admitting: Family Medicine

## 2011-08-01 ENCOUNTER — Ambulatory Visit (INDEPENDENT_AMBULATORY_CARE_PROVIDER_SITE_OTHER): Payer: Medicare Other | Admitting: Family Medicine

## 2011-08-01 DIAGNOSIS — M899 Disorder of bone, unspecified: Secondary | ICD-10-CM

## 2011-08-01 DIAGNOSIS — M8580 Other specified disorders of bone density and structure, unspecified site: Secondary | ICD-10-CM

## 2011-08-01 DIAGNOSIS — E785 Hyperlipidemia, unspecified: Secondary | ICD-10-CM

## 2011-08-01 DIAGNOSIS — I10 Essential (primary) hypertension: Secondary | ICD-10-CM

## 2011-08-01 DIAGNOSIS — E119 Type 2 diabetes mellitus without complications: Secondary | ICD-10-CM

## 2011-08-01 DIAGNOSIS — R109 Unspecified abdominal pain: Secondary | ICD-10-CM

## 2011-08-01 NOTE — Progress Notes (Signed)
  Subjective:    Patient ID: Katherine Christensen, female    DOB: [birth year], 71 y.o.   MRN: [id number]  HPI Katherine Christensen is present for a new patient visit to establish care and talk about her chronic medical illnesses. She currently only has one complaint, which is abdominal pain. Otherwise, we spoke about chronic medical issues noted below.   1. Abdominal Pain - located in right lower quadrants; varies between 2/10 and 5/10; chronic since oophorectomy in 2011; has been persistent with no pattern of exacerbation or relief; she denies nausea, vomiting, diarrhea, bloody stool; mentioned it to her GYN in the past, who didn't feel that work-up was warranted   2. Diabetes:    Lab Results  Component Value Date   HGBA1C 9.8 06/20/2011   Taking her metformin and glimepiride as prescribed; denies any hypoglycemic episodes; has performed minimal exercise 2/2 to cold weather; diet details not provided   3. Hyperlipidemia: Lipid Panel     Component Value Date/Time   CHOL 216* 10/10/2010 0946   TRIG 87 10/10/2010 0946   HDL 58 10/10/2010 0946   CHOLHDL 3.7 10/10/2010 0946   VLDL 17 10/10/2010 0946   LDLCALC 141* 10/10/2010 0946   Patient was prescribed rosuvastatin 20 mg in April 2012, but never started the medicine; concerned that the pill may have side effects; also wanted to try to control her cholesterol with diet and exercise; no currently keeping track of diet and exercise   4. HTN -   BP Readings from Last 3 Encounters:  08/06/11 142/70  08/01/11 138/83  07/29/11 147/81   Taking lisinopril as prescribed, no concerns about blood pressure; denies chest pain, SOB, vision loss, facial droop, numbness    Review of Systems  Constitutional: Positive for fatigue and unexpected weight change (unexpected weight loss since December 2012 ). Negative for fever.  HENT: Positive for rhinorrhea.   Respiratory: Negative for shortness of breath.   Cardiovascular: Negative for chest pain and leg swelling.    Gastrointestinal: Negative for abdominal distention.  Neurological: Negative for dizziness, facial asymmetry, weakness and numbness.       Objective:   Physical Exam  Constitutional: She appears well-nourished. No distress.       Obese body habitus; very pleasant  Cardiovascular: Normal rate, regular rhythm and normal heart sounds.   No murmur heard. Pulmonary/Chest: Effort normal and breath sounds normal.  Abdominal: Bowel sounds are normal. She exhibits no abdominal bruit and no mass. There is tenderness (point tenderness in RLG only if pressed very firmly). There is no rigidity, no rebound, no guarding, no tenderness at McBurney's point and negative Murphy's sign.  Skin: She is not diaphoretic.   BP 138/83  Pulse 93  Temp(Src) 98.7 F (37.1 C) (Oral)  Ht 5\' 2"  (1.575 m)  Wt 239 lb (108.41 kg)  BMI 43.71 kg/m2      Assessment & Plan:  Katherine Christensen is a new patient who needs improved management for several chronic disease including hypertension, Type 2 DM, and hyperlipidemia. Her abdominal pain is mild and chronic and does not warrant further work-up at this time.

## 2011-08-04 ENCOUNTER — Other Ambulatory Visit: Payer: Self-pay | Admitting: Family Medicine

## 2011-08-04 ENCOUNTER — Telehealth: Payer: Self-pay | Admitting: Family Medicine

## 2011-08-04 DIAGNOSIS — Z1231 Encounter for screening mammogram for malignant neoplasm of breast: Secondary | ICD-10-CM

## 2011-08-04 NOTE — Telephone Encounter (Signed)
Waiting for call back. Left message. Will ask pt more questions. Lorenda Hatchet, Renato Battles

## 2011-08-04 NOTE — Telephone Encounter (Signed)
Has an appt Dr Horald Pollen, endocrinology on Feb 20th @ 10am - wants to know if she should still go.  pls advise

## 2011-08-04 NOTE — Telephone Encounter (Signed)
Pt called back and she will keep her appt with the endocrinologist. Will fwd. To Dr.Williamson for info. Lorenda Hatchet, Renato Battles

## 2011-08-06 ENCOUNTER — Ambulatory Visit (INDEPENDENT_AMBULATORY_CARE_PROVIDER_SITE_OTHER): Payer: Medicare Other | Admitting: Sports Medicine

## 2011-08-06 VITALS — BP 142/70

## 2011-08-06 DIAGNOSIS — M179 Osteoarthritis of knee, unspecified: Secondary | ICD-10-CM

## 2011-08-06 DIAGNOSIS — M1710 Unilateral primary osteoarthritis, unspecified knee: Secondary | ICD-10-CM

## 2011-08-06 NOTE — Progress Notes (Addendum)
  Subjective:    Patient ID: Katherine Christensen, female    DOB: 17-May-1941, 71 y.o.   MRN: [id number]  HPI  This patient comes in for Supartz injection #5 of her right knee.  Overall doing very well. She is asking to have the other knee done.  Review of Systems   Negative with regards to the chief complaint    Objective:   Physical Exam   Procedure: Supartz injection Consent obtained and verified. Time-out conducted. Noted no overlying erythema, induration, or other signs of local infection. Skin prepped in a sterile fashion. Topical analgesic spray: Ethyl chloride. Joint: Right knee Completed without difficulty. Meds: 25 mg/2.5 mL of Supartz (sodium hyaluronate). Advised to call if fevers/chills, erythema, induration, drainage, or persistent bleeding.       Assessment & Plan:   1: Right knee degenerative joint disease: Supartz injection as above.  Series now completed, she will come back in 1 month to see how she is doing.  2.  Left knee DJD:  Pt would like to return to discuss cortisone shots into the left knee.

## 2011-08-06 NOTE — Patient Instructions (Signed)
Great to see you Katherine Christensen,  Supartz #5 done. See you in a week to talk about the left knee!   Ihor Austin. [name], M.D. [facility] [address], [place] [address] [phone]

## 2011-08-07 ENCOUNTER — Encounter: Payer: Self-pay | Admitting: Family Medicine

## 2011-08-07 DIAGNOSIS — R109 Unspecified abdominal pain: Secondary | ICD-10-CM | POA: Insufficient documentation

## 2011-08-07 DIAGNOSIS — M8580 Other specified disorders of bone density and structure, unspecified site: Secondary | ICD-10-CM | POA: Insufficient documentation

## 2011-08-07 NOTE — Assessment & Plan Note (Signed)
Not adequately controlled; BP should be less than 130/80 according ot JNC y guideline for HTN in diabetics - continue lisinopril, consider addition of 2nd agent - encourage daily walking of 30 minutes as tolerated

## 2011-08-07 NOTE — Assessment & Plan Note (Addendum)
Poorly controlled DM.   - Metformin 500 mg daily, Glimepiride 4 mg BID; adjust as needed at next dose - according to old record patient has history of medical non-compliance, explore reasons why - Follow/up in 1 month for A1C and med mgt  - Also perform diabetic foot exam at next visit if it has been greater than 1 year.

## 2011-08-07 NOTE — Assessment & Plan Note (Signed)
Lipid Panel     Component Value Date/Time   CHOL 216* 10/10/2010 0946   TRIG 87 10/10/2010 0946   HDL 58 10/10/2010 0946   CHOLHDL 3.7 10/10/2010 0946   VLDL 17 10/10/2010 0946   LDLCALC 141* 10/10/2010 0946    Her goal of LDL should be < 100 (or less than 70 if wanting aggressive tx given DM);  Patient has refused statin tx despite being told at multiple visits that it would benefit her; explore barriers to taking medicine at next visit;  Re-check lipids in April 2012 and try to agree on tx plan by that time.

## 2011-08-07 NOTE — Assessment & Plan Note (Signed)
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

## 2011-08-13 ENCOUNTER — Ambulatory Visit (INDEPENDENT_AMBULATORY_CARE_PROVIDER_SITE_OTHER): Payer: Medicare Other | Admitting: Sports Medicine

## 2011-08-13 VITALS — BP 142/70

## 2011-08-13 DIAGNOSIS — M179 Osteoarthritis of knee, unspecified: Secondary | ICD-10-CM

## 2011-08-13 DIAGNOSIS — M1710 Unilateral primary osteoarthritis, unspecified knee: Secondary | ICD-10-CM

## 2011-08-13 DIAGNOSIS — IMO0002 Reserved for concepts with insufficient information to code with codable children: Secondary | ICD-10-CM

## 2011-08-13 NOTE — Progress Notes (Signed)
  Subjective:    Patient ID: Katherine Christensen, female    DOB: 1940/08/09, 71 y.o.   MRN: [id number]  HPI  B/L knee DJD.  R knee:  S/p supartz series, doing excellent. L knee OA:  Medial joint line pain, worse with walking, aching, no radiation.  Moderate severity.  Never had CSI, would like this today.  Review of Systems    No fevers, chills, night sweats, weight loss, chest pain, or shortness of breath.  Objective:   Physical Exam General:  Well developed, well nourished, and in no acute distress. Neuro:  Alert and oriented x3, extra-ocular muscles intact. Skin: Warm and dry, no rashes noted. Respiratory:  Not using accessory muscles, speaking in full sentences.  Left Knee: Normal to inspection with no erythema or effusion or obvious bony abnormalities. Palpation normal with no warmth or patellar tenderness or condyle tenderness. Mild medial joint line tenderness. ROM normal in flexion and extension and lower leg rotation. Ligaments with solid consistent endpoints including ACL, PCL, LCL, MCL. Negative Mcmurray's and provocative meniscal tests. Non painful patellar compression. Patellar and quadriceps tendons unremarkable. Hamstring and quadriceps strength is normal.  Consent obtained and verified. Time-out conducted. Noted no overlying erythema, induration, or other signs of local infection. Skin prepped in a sterile fashion. Topical analgesic spray: Ethyl chloride. Joint: left knee Needle: 25g Completed without difficulty. Meds: 1 cc depo medrol. 4 cc lidocaine. Pain immediately improved suggesting accurate placement of the medication. Advised to call if fevers/chills, erythema, induration, drainage, or persistent bleeding.      Assessment & Plan:

## 2011-08-13 NOTE — Patient Instructions (Signed)
Great to see you FLORAINE BUECHLER, Injected your left knee with cortisone. Come back to see me in 2-3 months if you need.    Ihor Austin. [name], M.D. [facility] [address], [place] [address] [phone]

## 2011-08-13 NOTE — Assessment & Plan Note (Signed)
CSI as above. Pt is going to start aquatic therapy. RTC 2-3 months PRN. If fails CSI on left knee, can start supartz.

## 2011-08-23 NOTE — Assessment & Plan Note (Signed)
Lab Results  Component Value Date   HGBA1C 9.8 06/20/2011   HGBA1C 10.0 01/06/2011   HGBA1C 7.7 06/18/2010   Lab Results  Component Value Date   MICROALBUR 0.60 04/17/2009   LDLCALC 141* 10/10/2010   CREATININE 0.61 10/10/2010   Her A1C is better but still out of control.  She will likely need to increase her metformin but today she states that she hasn't been taking.  We will refill it today and follow up to see how she is doing and likely need to increase her medication and possibly start insulin.

## 2011-08-23 NOTE — Assessment & Plan Note (Signed)
Lab Results  Component Value Date   CHOL 216* 10/10/2010   CHOL 202* 01/09/2010   CHOL 200 04/17/2009   Lab Results  Component Value Date   HDL 58 10/10/2010   HDL 62 1/61/0960   HDL 55 45/40/9811   Lab Results  Component Value Date   LDLCALC 141* 10/10/2010   LDLCALC 118* 01/09/2010   LDLCALC 127* 04/17/2009   Lab Results  Component Value Date   TRIG 87 10/10/2010   TRIG 112 01/09/2010   TRIG 92 04/17/2009   Lab Results  Component Value Date   CHOLHDL 3.7 10/10/2010   CHOLHDL 3.3 Ratio 01/09/2010   CHOLHDL 3.6 Ratio 04/17/2009   No results found for this basename: LDLDIRECT   She is in need of a repeat FLP but has not been taking her medications.  We will refill her medication and follow up in 3 months.

## 2011-08-23 NOTE — Assessment & Plan Note (Signed)
She has an acute URI.  Given the fever and cough with production we will treat with Azithromycin and symptomatic treatment and continue to follow up.

## 2011-08-25 ENCOUNTER — Ambulatory Visit
Admission: RE | Admit: 2011-08-25 | Discharge: 2011-08-25 | Disposition: A | Discharge status: Home / Self Care | Payer: Medicare Other | Source: Ambulatory Visit | Attending: Family Medicine | Admitting: Family Medicine

## 2011-08-25 DIAGNOSIS — Z1231 Encounter for screening mammogram for malignant neoplasm of breast: Secondary | ICD-10-CM

## 2011-09-02 ENCOUNTER — Encounter: Payer: Self-pay | Admitting: Family Medicine

## 2011-09-09 ENCOUNTER — Encounter: Payer: Self-pay | Admitting: Sports Medicine

## 2011-09-09 ENCOUNTER — Ambulatory Visit (INDEPENDENT_AMBULATORY_CARE_PROVIDER_SITE_OTHER): Payer: Medicare Other | Admitting: Sports Medicine

## 2011-09-09 VITALS — BP 131/72 | HR 76

## 2011-09-09 DIAGNOSIS — M1710 Unilateral primary osteoarthritis, unspecified knee: Secondary | ICD-10-CM

## 2011-09-09 DIAGNOSIS — IMO0002 Reserved for concepts with insufficient information to code with codable children: Secondary | ICD-10-CM

## 2011-09-09 DIAGNOSIS — M179 Osteoarthritis of knee, unspecified: Secondary | ICD-10-CM

## 2011-09-09 NOTE — Assessment & Plan Note (Signed)
Flare of her osteoarthritis do to excessive physical activity recently. She will use her Celebrex for a few days. I will see her back on an as-needed basis. And Katherine Christensen worked so well on her right side, when her cortisone injection wears off on the left side, we can consider Supartz.

## 2011-09-09 NOTE — Progress Notes (Signed)
  Subjective:    Patient ID: Katherine Christensen, female    DOB: 1940/07/19, 71 y.o.   MRN: [id number]  HPI [name] comes in for recurrence of her right knee pain. She is status post a Supartz series on this side. Overall her pain was completely resolved, however over the last week she over did it. Overall the pain is improved over the week, and she has not yet used any of her oral Celebrex that she has at home. No swelling, no mechanical symptoms.  Her left knee is doing very well.   Review of Systems    No fevers, chills, night sweats, weight loss, chest pain, or shortness of breath.  Social History: Non-smoker. Objective:   Physical Exam General:  Well developed, well nourished, and in no acute distress. Neuro:  Alert and oriented x3, extra-ocular muscles intact. Skin: Warm and dry, no rashes noted. Respiratory:  Not using accessory muscles, speaking in full sentences. Musculoskeletal: Bilateral Knee: Normal to inspection with no erythema or effusion or obvious bony abnormalities. Palpation normal with no warmth, joint line tenderness, patellar tenderness, or condyle tenderness. ROM full in flexion and extension and lower leg rotation. Ligaments with solid consistent endpoints including ACL, PCL, LCL, MCL. Negative Mcmurray's, Apley's, and Thessalonian tests. Non painful patellar compression. Patellar glide without crepitus. Patellar and quadriceps tendons unremarkable. Hamstring and quadriceps strength is normal.      Assessment & Plan:

## 2011-11-28 ENCOUNTER — Ambulatory Visit (INDEPENDENT_AMBULATORY_CARE_PROVIDER_SITE_OTHER): Payer: Medicare Other | Admitting: Family Medicine

## 2011-11-28 ENCOUNTER — Telehealth: Payer: Self-pay | Admitting: Family Medicine

## 2011-11-28 ENCOUNTER — Encounter: Payer: Self-pay | Admitting: Family Medicine

## 2011-11-28 VITALS — BP 146/79 | HR 98 | Temp 98.4°F | Ht 62.0 in | Wt 241.1 lb

## 2011-11-28 DIAGNOSIS — J069 Acute upper respiratory infection, unspecified: Secondary | ICD-10-CM

## 2011-11-28 DIAGNOSIS — I10 Essential (primary) hypertension: Secondary | ICD-10-CM

## 2011-11-28 NOTE — Patient Instructions (Signed)
Dear Katherine Christensen,   It was great to see you today. Thank you for coming to clinic. Please read below regarding the issues that we discussed.   1. I believe you have an upper respiratory infection (see below). I am glad things are starting to get better. I would expect them to continue that way. If you are not continuing to get better, you and Dr. Clinton Sawyer can reopen the discussion on Tuesday. I do not hear any wheeze on your lungs and I do not think you currently have a bronchitis.   Please call earlier if you have any questions or concerns.   Sincerely, Dr. Tana Conch Upper Respiratory Infection, Adult An upper respiratory infection (URI) is also sometimes known as the common cold. The upper respiratory tract includes the nose, sinuses, throat, trachea, and bronchi. Bronchi are the airways leading to the lungs. Most people improve within 1 week, but symptoms can last up to 2 weeks. A residual cough may last even longer.  CAUSES Many different viruses can infect the tissues lining the upper respiratory tract. The tissues become irritated and inflamed and often become very moist. Mucus production is also common. A cold is contagious. You can easily spread the virus to others by oral contact. This includes kissing, sharing a glass, coughing, or sneezing. Touching your mouth or nose and then touching a surface, which is then touched by another person, can also spread the virus. SYMPTOMS  Symptoms typically develop 1 to 3 days after you come in contact with a cold virus. Symptoms vary from person to person. They may include:  Runny nose.   Sneezing.   Nasal congestion.   Sinus irritation.   Sore throat.   Loss of voice (laryngitis).   Cough.   Fatigue.   Muscle aches.   Loss of appetite.   Headache.   Low-grade fever.  DIAGNOSIS  You might diagnose your own cold based on familiar symptoms, since most people get a cold 2 to 3 times a year. Your caregiver can confirm this  based on your exam. Most importantly, your caregiver can check that your symptoms are not due to another disease such as strep throat, sinusitis, pneumonia, asthma, or epiglottitis. Blood tests, throat tests, and X-rays are not necessary to diagnose a common cold, but they may sometimes be helpful in excluding other more serious diseases. Your caregiver will decide if any further tests are required. RISKS AND COMPLICATIONS  You may be at risk for a more severe case of the common cold if you smoke cigarettes, have chronic heart disease (such as heart failure) or lung disease (such as asthma), or if you have a weakened immune system. The very young and very old are also at risk for more serious infections. Bacterial sinusitis, middle ear infections, and bacterial pneumonia can complicate the common cold. The common cold can worsen asthma and chronic obstructive pulmonary disease (COPD). Sometimes, these complications can require emergency medical care and may be life-threatening. PREVENTION  The best way to protect against getting a cold is to practice good hygiene. Avoid oral or hand contact with people with cold symptoms. Wash your hands often if contact occurs. There is no clear evidence that vitamin C, vitamin E, echinacea, or exercise reduces the chance of developing a cold. However, it is always recommended to get plenty of rest and practice good nutrition. TREATMENT  Treatment is directed at relieving symptoms. There is no cure. Antibiotics are not effective, because the infection is caused by a  virus, not by bacteria. Treatment may include:  Increased fluid intake. Sports drinks offer valuable electrolytes, sugars, and fluids.   Breathing heated mist or steam (vaporizer or shower).   Eating chicken soup or other clear broths, and maintaining good nutrition.   Getting plenty of rest.   Using gargles or lozenges for comfort.   Controlling fevers with ibuprofen or acetaminophen as directed by  your caregiver.  Zinc gel and zinc lozenges, taken in the first 24 hours of the common cold, can shorten the duration and lessen the severity of symptoms. Pain medicines may help with fever, muscle aches, and throat pain. A variety of non-prescription medicines are available to treat congestion and runny nose. Your caregiver can make recommendations and may suggest nasal or lung inhalers for other symptoms.  HOME CARE INSTRUCTIONS   Only take over-the-counter or prescription medicines for pain, discomfort, or fever as directed by your caregiver.   Use a warm mist humidifier or inhale steam from a shower to increase air moisture. This may keep secretions moist and make it easier to breathe.   Drink enough water and fluids to keep your urine clear or pale yellow.   Rest as needed.   Return to work when your temperature has returned to normal or as your caregiver advises. You may need to stay home longer to avoid infecting others. You can also use a face mask and careful hand washing to prevent spread of the virus.  SEEK MEDICAL CARE IF:   After the first few days, you feel you are getting worse rather than better.   You need your caregiver's advice about medicines to control symptoms.   You develop chills, worsening shortness of breath, or brown or red sputum. These may be signs of pneumonia.   You develop yellow or brown nasal discharge or pain in the face, especially when you bend forward. These may be signs of sinusitis.   You develop a fever, swollen neck glands, pain with swallowing, or white areas in the back of your throat. These may be signs of strep throat.  SEEK IMMEDIATE MEDICAL CARE IF:   You have a fever.   You develop severe or persistent headache, ear pain, sinus pain, or chest pain.   You develop wheezing, a prolonged cough, cough up blood, or have a change in your usual mucus (if you have chronic lung disease).   You develop sore muscles or a stiff neck.  Document  Released: 12/10/2000 Document Revised: 06/05/2011 Document Reviewed: 10/18/2010 [facility] Patient Information 2012 [place], [place].

## 2011-11-28 NOTE — Telephone Encounter (Signed)
Patient states she started with cough 2 nights ago. She  Is coughing up large amounts of clear sputum. Has some wheezing. No real SOB at this time she states.  Has  Discomfort in chest, and back from coughing she states. Appointment scheduled for this AM.

## 2011-11-28 NOTE — Telephone Encounter (Signed)
Pt is having a cough and thinks it might be bronchitis - she has an appt on Tues, but would like to talk to nurse to see what she needs to do.

## 2011-11-29 NOTE — Assessment & Plan Note (Signed)
BP continued to be elevated. Considered adding 2nd agent but patient has f/u with PCP next week so deferred.

## 2011-11-29 NOTE — Progress Notes (Signed)
  Subjective:    Patient ID: Katherine Christensen, female    DOB: 13-May-1941, 71 y.o.   MRN: [id number]  HPI 1. Cough, congestion-URI symptoms started Tuesday. Patient can feel symptoms coming on because of some ear pain which resolves in a few hours. 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 Tuesday started with muscle aches, sneezing, congestion. By Wednesday, hacky cough with some clear sputum as well as rhinorrhea. No fevers, chills, abdominal pain, nausea, vomiting. No chest pain or shortness of breath. Said she wasn't even sure if she should come in except she had bronchitis in the past (in the 90s when she was smoking) and she thought she was wheezing (described more as a course voice after coughing).   Review of Systems -See HPI  Past Medical History-smoking status noted: former smoker. Reviewed problem list.  Medications- reviewed and updated Chief complaint-noted    Objective:   Physical Exam  Constitutional: She is oriented to person, place, and time. She appears well-developed and well-nourished. No distress.  HENT:  Head: Normocephalic and atraumatic.  Right Ear: Tympanic membrane is not injected, not perforated, not erythematous, not retracted and not bulging.  Left Ear: Tympanic membrane is not injected, not perforated, not erythematous, not retracted and not bulging.  Nose: Rhinorrhea present. No epistaxis. Right sinus exhibits no maxillary sinus tenderness and no frontal sinus tenderness. Left sinus exhibits no maxillary sinus tenderness and no frontal sinus tenderness.  Mouth/Throat: Mucous membranes are normal. She has dentures. No oropharyngeal exudate, posterior oropharyngeal edema or tonsillar abscesses. Posterior oropharyngeal erythema: appears related to nasal drainage.  Cardiovascular: Normal rate, regular rhythm and intact distal pulses.  Exam reveals no gallop and no friction rub.   No  murmur heard. Pulmonary/Chest: Effort normal and breath sounds normal. She has no wheezes. She has no rales.       Lungs clear B, no rhonchi  Musculoskeletal: Normal range of motion. She exhibits no edema.  Neurological: She is alert and oriented to person, place, and time.  Skin: Skin is warm and dry. She is not diaphoretic.   BP 146/79  Pulse 98  Temp(Src) 98.4 F (36.9 C) (Oral)  Ht 5\' 2"  (1.575 m)  Wt 241 lb 1.6 oz (109.362 kg)  BMI 44.10 kg/m2  SpO2 98%        Assessment & Plan:

## 2011-11-29 NOTE — Assessment & Plan Note (Signed)
URI 2-3 days without fevers, wheeze, respiratory distress. History of bronchitis over 20 years ago while patient was a smoker. Patient very well appearing. Has follow up next week with PCP. Symptomatic treatment per AVS with addition of Neti pot encouraged (patient not interested).

## 2011-12-02 ENCOUNTER — Encounter: Payer: Self-pay | Admitting: Family Medicine

## 2011-12-02 ENCOUNTER — Ambulatory Visit (INDEPENDENT_AMBULATORY_CARE_PROVIDER_SITE_OTHER): Payer: Medicare Other | Admitting: Family Medicine

## 2011-12-02 VITALS — BP 143/76 | HR 79 | Temp 98.0°F | Ht 62.0 in | Wt 241.6 lb

## 2011-12-02 DIAGNOSIS — E785 Hyperlipidemia, unspecified: Secondary | ICD-10-CM

## 2011-12-02 DIAGNOSIS — R109 Unspecified abdominal pain: Secondary | ICD-10-CM

## 2011-12-02 DIAGNOSIS — E119 Type 2 diabetes mellitus without complications: Secondary | ICD-10-CM

## 2011-12-02 LAB — POCT GLYCOSYLATED HEMOGLOBIN (HGB A1C): Hemoglobin A1C: 9

## 2011-12-02 MED ORDER — ROSUVASTATIN CALCIUM 20 MG PO TABS: 20.0000 mg | ORAL_TABLET | Freq: Every day | ORAL | Status: DC

## 2011-12-02 NOTE — Progress Notes (Signed)
  Subjective:    Patient ID: Katherine Christensen, female    DOB: 03/07/1941, 71 y.o.   MRN: [id number]  HPI  Hyperlipidemia - Patient not taking Crestor even though she knows that she has elevated cholesterol. She states that she believed that she could control it with diet and exercise. She also recognized the risks of high cholesterol in patient with diabetes and states that she does want to reduce her risk of stroke and MI. She is willing to take medication and appreciates the information that Dr. Durene Cal provided at her previous follow-up. Denies chest pain, shortness of breath, numbness and tingling in extremities, facial droop.  Diabetes Mellitus - Taking metformin and glimepiride as prescribed by Dr. Talmage Nap. Currently, she is not exercising.   RLQ Pain - Persistent for 2 years since oophorectomy; intermittently sharp; has not been accompanied by fever nausea, vomiting, constipation or diarrhea; She is concerned that is could be related to her appendix    Review of Systems Negative unless stated in HPI    Objective:   Physical Exam BP 143/76  Pulse 79  Temp(Src) 98 F (36.7 C) (Oral)  Ht 5\' 2"  (1.575 m)  Wt 241 lb 9.6 oz (109.589 kg)  BMI 44.19 kg/m2 Gen: middle age AAF, very pleasant, obese, well appearing Cardiac: RRR, no murmurs Lungs: CTA-B Abd: protuberant abdomen; mild tenderness to palpation in RLQ, no palpable masses; normal bowel sounds  Lab Results  Component Value Date   HGBA1C 9.0 12/02/2011         Assessment & Plan:  Katherine Christensen is very pleasant but somewhat stubborn patient who has not been taking her cholesterol medication, but is more concerned about the acute on chronic RLQ pain.

## 2011-12-02 NOTE — Patient Instructions (Signed)
Dear Katherine Christensen,   Thank you for coming to clinic today. Please read below regarding the issues that we discussed.   1. Right Lower Quadrant Pain - Let's get an abdominal ultrasound to check on that area and make sure we're not missing something. I have put in an order for the test to be performed at [facility]  2. High Cholesterol - You are making a smart decision to take your Crestor as this will protect you against stroke and heart attack. Please take 20 mg daily. Also schedule an appointment for a lab draw this week or next so that a fasting lipid panel can be performed.   3. Diabetes - Your hemoglobin A1C is 9.0, which is greater than our goal of <7.0. Please let Dr. Talmage Nap know this result.   Please follow up in clinic in 3 weeks . Please call earlier if you have any questions or concerns.   Sincerely,   Dr. Clinton Sawyer

## 2011-12-02 NOTE — Assessment & Plan Note (Signed)
She has been very stubborn about taking medication, although claims that she will start.  - Check FLP - Increase Rosuvastatin to 20 mg daily with expectation that she will take intermittently; if she take it consistently and meet goal, then we can consider decreasing dose

## 2011-12-02 NOTE — Assessment & Plan Note (Signed)
A1c at 9.0. Elevated but improved from 9.8. Will defer to Dr. Willeen Cass plan.

## 2011-12-02 NOTE — Assessment & Plan Note (Signed)
I believe that this is most likely an adhesion from previous surgery. However, given its protracted and bothersome nature, I think further evaluation is warranted. Ab ultrasound will be ordered.

## 2011-12-04 ENCOUNTER — Other Ambulatory Visit: Payer: Medicare Other

## 2011-12-04 ENCOUNTER — Other Ambulatory Visit: Payer: Self-pay | Admitting: Family Medicine

## 2011-12-04 ENCOUNTER — Other Ambulatory Visit (HOSPITAL_COMMUNITY): Payer: Medicare Other

## 2011-12-04 ENCOUNTER — Ambulatory Visit (HOSPITAL_COMMUNITY)
Admission: RE | Admit: 2011-12-04 | Discharge: 2011-12-04 | Disposition: A | Discharge status: Home / Self Care | Payer: Medicare Other | Source: Ambulatory Visit | Attending: Family Medicine | Admitting: Family Medicine

## 2011-12-04 DIAGNOSIS — E785 Hyperlipidemia, unspecified: Secondary | ICD-10-CM

## 2011-12-04 DIAGNOSIS — R109 Unspecified abdominal pain: Secondary | ICD-10-CM

## 2011-12-04 DIAGNOSIS — R1031 Right lower quadrant pain: Secondary | ICD-10-CM | POA: Insufficient documentation

## 2011-12-04 DIAGNOSIS — E119 Type 2 diabetes mellitus without complications: Secondary | ICD-10-CM

## 2011-12-04 DIAGNOSIS — K7689 Other specified diseases of liver: Secondary | ICD-10-CM | POA: Insufficient documentation

## 2011-12-04 LAB — COMPREHENSIVE METABOLIC PANEL
Albumin: 3.8 g/dL (ref 3.5–5.2)
Alkaline Phosphatase: 75 U/L (ref 39–117)
CO2: 28 mEq/L (ref 19–32)
Chloride: 104 mEq/L (ref 96–112)
Glucose, Bld: 113 mg/dL — ABNORMAL HIGH (ref 70–99)
Potassium: 4.1 mEq/L (ref 3.5–5.3)
Sodium: 141 mEq/L (ref 135–145)
Total Protein: 6.5 g/dL (ref 6.0–8.3)

## 2011-12-04 NOTE — Progress Notes (Signed)
CMP AND FLP DONE TODAY Katherine Christensen 

## 2011-12-05 ENCOUNTER — Other Ambulatory Visit: Payer: Medicare Other

## 2011-12-25 ENCOUNTER — Encounter: Payer: Self-pay | Admitting: Family Medicine

## 2011-12-25 ENCOUNTER — Ambulatory Visit (INDEPENDENT_AMBULATORY_CARE_PROVIDER_SITE_OTHER): Payer: Medicare Other | Admitting: Family Medicine

## 2011-12-25 VITALS — BP 142/83 | HR 77 | Ht 62.0 in | Wt 239.0 lb

## 2011-12-25 DIAGNOSIS — I10 Essential (primary) hypertension: Secondary | ICD-10-CM

## 2011-12-25 DIAGNOSIS — E785 Hyperlipidemia, unspecified: Secondary | ICD-10-CM

## 2011-12-25 DIAGNOSIS — R109 Unspecified abdominal pain: Secondary | ICD-10-CM

## 2011-12-25 DIAGNOSIS — E119 Type 2 diabetes mellitus without complications: Secondary | ICD-10-CM

## 2011-12-25 NOTE — Patient Instructions (Addendum)
Dear Mrs. Hesse,   Thank you for coming to clinic today. Please read below regarding the issues that we discussed.   1. Cholesterol - Your LDL is 95. I am glad that it is lower than 100, which is the goal for right now. You do not need to take simvastatin right now. We will re-check in 6 months  2. Diabetes - This is our biggest focus. According to Dr. Talmage Nap, you should be taking glimepiride 4 mg two times a day, Metformin 100 mg four times a day, and Novolin insulin 10 units.   Please follow up in clinic in 6 months. Please call earlier if you have any questions or concerns.   Sincerely,   Dr. Clinton Sawyer

## 2011-12-30 ENCOUNTER — Telehealth: Payer: Self-pay | Admitting: Family Medicine

## 2011-12-30 DIAGNOSIS — E119 Type 2 diabetes mellitus without complications: Secondary | ICD-10-CM

## 2011-12-30 MED ORDER — METFORMIN HCL ER 500 MG PO TB24: ORAL_TABLET | ORAL | Status: DC

## 2011-12-30 NOTE — Assessment & Plan Note (Signed)
BP Readings from Last 3 Encounters:  12/25/11 142/83  12/02/11 143/76  11/28/11 146/79   Poorly controlled BP. Her goal is < 130/80. Dr. Talmage Nap is prescribing the HTN meds. However, if SBP > 140 at next visit, then I will likely start Norvasc.

## 2011-12-30 NOTE — Assessment & Plan Note (Signed)
I am not sure how her lipids spontaneously improved such that her LDL is now 95 and total cholesterol is only 163 after years of being elevated. Her Framingham risk score is 8%, but that does not account for diabetes, so I bet her risk of MI/CVA is more like 15%. I do not want to be aggressive with her lipid lowering since she is elderly and also b/c she probably won't take the medication. Therefore, I will use the ATP-III guidelines and consider her at LDL goal. Recheck in 6 months.   Lipid Panel     Component Value Date/Time   CHOL 163 12/04/2011 0856   TRIG 99 12/04/2011 0856   HDL 48 12/04/2011 0856   CHOLHDL 3.4 12/04/2011 0856   VLDL 20 12/04/2011 0856   LDLCALC 95 12/04/2011 0856

## 2011-12-30 NOTE — Assessment & Plan Note (Signed)
No findings on ultrasound. I still favor adhesions. No further work-up warranted.

## 2011-12-30 NOTE — Assessment & Plan Note (Signed)
Defer to Dr. Willeen Cass regimen of Novolin, Metformin, and Glimepiride. Hopefully improved glycemic control will correlate with continued control of cholesterol.

## 2011-12-30 NOTE — Telephone Encounter (Signed)
Clarified patient needs to take 1000 mg metformin BID. Refill sent to pharmacy.

## 2011-12-30 NOTE — Progress Notes (Signed)
  Subjective:    Patient ID: Nigel Mormon, female    DOB: August 23, 1940, 71 y.o.   MRN: [id number]  HPI  Mrs. Carrero is a 71 year old female with diabetes hyperlipidemia and obesity who presents for a follow-up visit.   #1. Diabetes-  since her last visit Mrs. Briere was seen by her endocrinologist Dr. Talmage Nap. Dr. Talmage Nap added insulin Novolin 10 units daily at bedtime in addition to glimeperide and metformin.  Lab Results  Component Value Date   HGBA1C 9.0 12/02/2011   #2. Abdominal Pain - Mrs. Harms is also here to follow-up on her RLQ abdominal pain. An ultrasound was performed after her last visit. Since that time, she notes minimal abdominal pain.   #3. Hyperlipidemia - Patient has had hyperlipidemia for years but always resisted treatment, because she is stubborn and skeptical. She was told to take rosuvastatin 20 mg daily after her last visit. She has not. She is here to follow-up on her fasting lipid panel.   Review of Systems Positive for lower back pain Negative for chest pain, shortness of breath, facial droop, extremity weakness    Objective:   Physical Exam BP 142/83  Pulse 77  Ht 5\' 2"  (1.575 m)  Wt 239 lb (108.41 kg)  BMI 43.71 kg/m2 Gen: Alert, and oriented x 4, elderly AAF, well appearing and pleasant   Lipid Panel     Component Value Date/Time   CHOL 163 12/04/2011 0856   TRIG 99 12/04/2011 0856   HDL 48 12/04/2011 0856   CHOLHDL 3.4 12/04/2011 0856   VLDL 20 12/04/2011 0856   LDLCALC 95 12/04/2011 0856       Assessment & Plan:   Mrs. Clippinger is a 71 year old female with poorly controlled diabetes, HTN, obesity and hyperlipidemia that spontaneously resolved.

## 2012-01-06 ENCOUNTER — Encounter: Payer: Self-pay | Admitting: Home Health Services

## 2012-01-06 ENCOUNTER — Telehealth: Payer: Self-pay | Admitting: Family Medicine

## 2012-01-06 ENCOUNTER — Ambulatory Visit (INDEPENDENT_AMBULATORY_CARE_PROVIDER_SITE_OTHER): Payer: Medicare Other | Admitting: Home Health Services

## 2012-01-06 VITALS — BP 129/84 | HR 73 | Temp 98.3°F | Ht 62.0 in | Wt 239.3 lb

## 2012-01-06 DIAGNOSIS — Z0000 Encounter for general adult medical examination without abnormal findings: Secondary | ICD-10-CM

## 2012-01-06 NOTE — Progress Notes (Signed)
Patient here for annual wellness visit, patient reports: Risk Factors/Conditions needing evaluation or treatment: Pt does not have any new risk factors that need evaluation. Home Safety: Pt lives by self in 1 story home.  Pt reports having smoke detectors and adaptive equipment in bathroom. Other Information: Corrective lens: Pt wears daily corrective lens.  Pt reports going to eye dr annually.  Dentures: Pt has full upper denture, partial bottom. Memory: Pt denies memory problems. Patient's Mini Mental Score (recorded in doc. flowsheet): 30  Balance/Gait:  Balance Abnormal Patient value  Sitting balance    Sit to stand    Attempts to arise    Immediate standing balance    Standing balance    Nudge    Eyes closed- Romberg    Tandem stance x Unable to do  Back lean    Neck Rotation    360 degree turn    Sitting down     Gait Abnormal Patient value  Initiation of gait    Step length-left    Step length-right    Step height-left    Step height-right x limp  Step symmetry    Step continuity    Path deviation    Trunk movement    Walking stance        Annual Wellness Visit Requirements Recorded Today In  Medical, family, social history Past Medical, Family, Social History Section  Current providers Care team  Current medications Medications  Wt, BP, Ht, BMI Vital signs  Hearing assessment (welcome visit) Pt reported getting done  Tobacco, alcohol, illicit drug use History  ADL Nurse Assessment  Depression Screening Nurse Assessment  Cognitive impairment Nurse Assessment  Mini Mental Status Document Flowsheet  Fall Risk Nurse Assessment  Home Safety Progress Note  End of Life Planning (welcome visit) Social Documentation  Medicare preventative services Progress Note  Risk factors/conditions needing evaluation/treatment Progress Note  Personalized health advice Patient Instructions, goals, letter  Diet & Exercise Social Documentation  Emergency Contact Social  Documentation  Seat Belts Social Documentation  Sun exposure/protection Social Documentation    Prevention Plan:   Recommended Medicare Prevention Screenings Women over 65 Test For Frequency Date of Last- BOLD if needed  Breast Cancer 1-2 yrs 2/13  Cervical Cancer 1-3 yrs NI-due to age  Colorectal Cancer 1-10 yrs 8/07  Osteoporosis once 2/07  Cholesterol 5 yrs 6/13  Diabetes yearly 6/13  HIV yearly declined  Influenza Shot yearly discussed  Pneumonia Shot once 2/11  Zostavax Shot once discussed

## 2012-01-06 NOTE — Patient Instructions (Signed)
1. Continue to work on losing weight. 2. Consider starting regular exercise routine. 3. Complete living will and bring Dr. Clinton Sawyer a copy.

## 2012-01-06 NOTE — Progress Notes (Signed)
I have reviewed this visit and discussed with Suzanne Lineberry and agree with her documentation  

## 2012-01-06 NOTE — Telephone Encounter (Signed)
Patient wanted you to know that the class she went to is called [employer] [facility] [employer].  She says she needs to be referred to the vertigo clinic.

## 2012-01-15 ENCOUNTER — Other Ambulatory Visit: Payer: Self-pay | Admitting: Family Medicine

## 2012-01-15 NOTE — Telephone Encounter (Signed)
I left the pts vertigo request in Dr. Yetta Numbers box.

## 2012-01-15 NOTE — Telephone Encounter (Signed)
I do not know what the vertigo clinic is. The flyer looks like a free seminar that she attended. Please clarify. Thank you.

## 2012-02-15 ENCOUNTER — Emergency Department (INDEPENDENT_AMBULATORY_CARE_PROVIDER_SITE_OTHER)
Admission: EM | Admit: 2012-02-15 | Discharge: 2012-02-15 | Disposition: A | Discharge status: Home / Self Care | Payer: Medicare Other | Source: Home / Self Care

## 2012-02-15 ENCOUNTER — Encounter (HOSPITAL_COMMUNITY): Payer: Self-pay | Admitting: *Deleted

## 2012-02-15 DIAGNOSIS — B9689 Other specified bacterial agents as the cause of diseases classified elsewhere: Secondary | ICD-10-CM

## 2012-02-15 DIAGNOSIS — L293 Anogenital pruritus, unspecified: Secondary | ICD-10-CM

## 2012-02-15 DIAGNOSIS — N898 Other specified noninflammatory disorders of vagina: Secondary | ICD-10-CM

## 2012-02-15 DIAGNOSIS — N760 Acute vaginitis: Secondary | ICD-10-CM

## 2012-02-15 DIAGNOSIS — A499 Bacterial infection, unspecified: Secondary | ICD-10-CM

## 2012-02-15 LAB — WET PREP, GENITAL

## 2012-02-15 LAB — POCT URINALYSIS DIP (DEVICE)
Bilirubin Urine: NEGATIVE
Ketones, ur: NEGATIVE mg/dL
Protein, ur: NEGATIVE mg/dL
Specific Gravity, Urine: 1.015 (ref 1.005–1.030)
pH: 5.5 (ref 5.0–8.0)

## 2012-02-15 MED ORDER — METRONIDAZOLE 500 MG PO TABS: 500.0000 mg | ORAL_TABLET | Freq: Two times a day (BID) | ORAL | Status: AC

## 2012-02-15 MED ORDER — SULFAMETHOXAZOLE-TRIMETHOPRIM 800-160 MG PO TABS: 1.0000 | ORAL_TABLET | Freq: Two times a day (BID) | ORAL | Status: AC

## 2012-02-15 MED ORDER — FLUCONAZOLE 150 MG PO TABS: 150.0000 mg | ORAL_TABLET | Freq: Once | ORAL | Status: AC

## 2012-02-15 NOTE — ED Notes (Signed)
Reports frequent yeast infections due to diabetes.  Last week started to feel slight itching - took a fluconazole pill with relief.  Today when she went to urinate, had excruciating pain to external genitalia.  Denies any itching.  Has applied Vaseline, but states skin is "angry".  Denies any vaginal discharge.

## 2012-02-15 NOTE — ED Provider Notes (Signed)
History     CSN: [id number]  Arrival date & time 02/15/12  1828   None     Chief Complaint  Patient presents with  . Vaginitis    (Consider location/radiation/quality/duration/timing/severity/associated sxs/prior treatment) The history is provided by the patient.  71 y.o. female complains of vaginal burning and itching since this am after wiping with toilet tissue.  States she did notice some vaginal itching last week and took a diflucan for same.  Noted some improvement with diflucan.  Denies abnormal vaginal bleeding, significant pelvic pain or fever. No UTI symptoms. Not sexually active, enies history of known exposure to STD.  Hysterectomy in 1983.  Oophorectomy 2 years ago.  Known diabetics. Past Medical History  Diagnosis Date  . Arrhythmia   . Mild anemia   . Osteoarthritis   . Female pelvic pain   . Mole (skin)   . Borderline glaucoma   . GERD (gastroesophageal reflux disease)   . Type 2 diabetes mellitus   . Hypertension   . Obesity   . Hyperlipidemia   . Sleep apnea   . MRSA (methicillin resistant staph aureus) culture positive   . Systolic murmur   . Hemorrhoids   . Yeast vaginitis     Past Surgical History  Procedure Date  . Vaginal hysterectomy 1982    For fibroids  . Knee arthroscopy   . Laparoscopy   . Dilation and curettage of uterus   . Breast biopsy   . Oophorectomy     Family History  Problem Relation Age of Onset  . Alcohol abuse Father   . Cancer Maternal Grandmother     throat cancer    History  Substance Use Topics  . Smoking status: Former Smoker    Types: Cigarettes    Quit date: 07/02/1988  . Smokeless tobacco: Not on file  . Alcohol Use: No    OB History    Grav Para Term Preterm Abortions TAB SAB Ect Mult Living                  Review of Systems  Constitutional: Negative.   Respiratory: Negative.   Cardiovascular: Negative.   Gastrointestinal: Negative.   Genitourinary: Positive for dysuria and vaginal discharge.  Negative for urgency, frequency, hematuria, flank pain, decreased urine volume, vaginal bleeding, difficulty urinating, genital sores, vaginal pain, menstrual problem and pelvic pain.    Allergies  Meperidine hcl; Nickel; and Penicillins  Home Medications   Current Outpatient Rx  Name Route Sig Dispense Refill  . ACCU-CHEK AVIVA PLUS VI STRP  TEST BLOOD GLUCOSE TWICE DAILY 100 strip 3  . GLIMEPIRIDE 4 MG PO TABS Oral Take 4 mg by mouth 2 (two) times daily.      . GNP LANCETS MICRO THIN 33G MISC  TEST BLOOD GLUCOSE TWICE DAILY 100 each 3  . HYDROCHLOROTHIAZIDE 25 MG PO TABS Oral Take 25 mg by mouth as needed.     . INSULIN ISOPHANE HUMAN 100 UNIT/ML [place] SUSP Subcutaneous Inject 10 Units into the skin at bedtime.    Marland Kitchen LOSARTAN POTASSIUM 100 MG PO TABS Oral Take 100 mg by mouth daily.    Marland Kitchen METFORMIN HCL ER 500 MG PO TB24  Take 1000 mg two times a day. 120 tablet 5  . CELECOXIB 200 MG PO CAPS Oral Take 1 capsule (200 mg total) by mouth 2 (two) times daily. 60 capsule 3  . FLUCONAZOLE 150 MG PO TABS Oral Take 1 tablet (150 mg total) by mouth once. May  repeat in one week as needed. 2 tablet 1  . LISINOPRIL 20 MG PO TABS Oral Take 1 tablet (20 mg total) by mouth daily. 30 tablet 11  . METRONIDAZOLE 500 MG PO TABS Oral Take 1 tablet (500 mg total) by mouth 2 (two) times daily. 14 tablet 0  . ROSUVASTATIN CALCIUM 20 MG PO TABS Oral Take 1 tablet (20 mg total) by mouth at bedtime. 30 tablet 6  . SULFAMETHOXAZOLE-TRIMETHOPRIM 800-160 MG PO TABS Oral Take 1 tablet by mouth 2 (two) times daily. 6 tablet 0    BP 150/62  Pulse 89  Temp 98.7 F (37.1 C) (Oral)  Resp 18  SpO2 100%  Physical Exam  Nursing note and vitals reviewed. Constitutional: She is oriented to person, place, and time. Vital signs are normal. She appears well-developed and well-nourished. She is active and cooperative.  HENT:  Head: Normocephalic.  Eyes: Conjunctivae are normal. Pupils are equal, round, and reactive to light.  No scleral icterus.  Neck: Trachea normal. Neck supple.  Cardiovascular: Normal rate and regular rhythm.   Pulmonary/Chest: Effort normal and breath sounds normal. No respiratory distress.  Abdominal: Soft. Bowel sounds are normal. There is no tenderness. There is no guarding.  Genitourinary: Pelvic exam was performed with patient supine. There is no rash, tenderness or lesion on the right labia. There is no rash, tenderness or lesion on the left labia. There is erythema around the vagina. No tenderness or bleeding around the vagina. No foreign body around the vagina. No signs of injury around the vagina. Vaginal discharge found.       Limited internal exam related to patient discomfort  Lymphadenopathy:       Right: No inguinal adenopathy present.       Left: No inguinal adenopathy present.  Neurological: She is alert and oriented to person, place, and time. No cranial nerve deficit or sensory deficit.  Skin: Skin is warm and dry.  Psychiatric: She has a normal mood and affect. Her speech is normal and behavior is normal. Judgment and thought content normal. Cognition and memory are normal.    ED Course  Procedures (including critical care time)  Labs Reviewed  POCT URINALYSIS DIP (DEVICE) - Abnormal; Notable for the following:    Glucose, UA 500 (*)     Hgb urine dipstick TRACE (*)     All other components within normal limits  WET PREP, GENITAL - Abnormal; Notable for the following:    Clue Cells Wet Prep HPF POC FEW (*)     WBC, Wet Prep HPF POC FEW (*)     All other components within normal limits   No results found.   1. Bacterial vaginosis   2. Vaginal itching   3. Vaginal Discharge       MDM  Increase fluid intake.  Take medication as prescribed.  Follow up with your physician if symptoms are not improved.          Johnsie Kindred, NP 02/15/12 2032

## 2012-02-16 NOTE — ED Provider Notes (Signed)
Medical screening examination/treatment/procedure(s) were performed by non-physician practitioner and as supervising physician I was immediately available for consultation/collaboration.  Leslee Home, M.D.   Reuben Likes, MD 02/16/12 2139

## 2012-02-27 ENCOUNTER — Ambulatory Visit (INDEPENDENT_AMBULATORY_CARE_PROVIDER_SITE_OTHER): Payer: Medicare Other | Admitting: Family Medicine

## 2012-02-27 ENCOUNTER — Encounter: Payer: Self-pay | Admitting: Family Medicine

## 2012-02-27 VITALS — BP 136/80 | HR 83 | Ht 62.0 in | Wt 241.0 lb

## 2012-02-27 DIAGNOSIS — R197 Diarrhea, unspecified: Secondary | ICD-10-CM

## 2012-02-27 DIAGNOSIS — R42 Dizziness and giddiness: Secondary | ICD-10-CM

## 2012-02-27 NOTE — Patient Instructions (Signed)
Dear Katherine Christensen,   Thank you for coming to clinic today. Please read below regarding the issues that we discussed.   1. Vertigo - I am sorry that you have had problems with this in the past, but glad that you are doing well now. I am happy to refer you to therapy, but I'm not sure who effective it is if you are not having problems right now.   2. Diarrhea - Please take an imodium and you'll likely feel better. Remember to stay hydrated by drinking 8 ounces of water every 2 hours at least. If this continues to worsen, is associated with bloody stool, and vomiting, then please return.   Please follow up in clinic in 1 month. You need an A1C drawn at that time for your diabetes. Please call earlier if you have any questions or concerns.   Sincerely,   Dr. Clinton Sawyer

## 2012-02-28 DIAGNOSIS — R42 Dizziness and giddiness: Secondary | ICD-10-CM | POA: Insufficient documentation

## 2012-02-28 NOTE — Assessment & Plan Note (Signed)
I could not elicit any symptoms from the patient today. However, her history is compatible with BPPV. At this time, she is not symptomatic, but may benefit from a visit neuro rehab to learn some exercises that may benefit her if the symptoms return.

## 2012-02-28 NOTE — Assessment & Plan Note (Signed)
Likely secondary to taking her Metformin, since this is common for her. No red flags for infection or need for further evaluation. Patient encouraged to take loperamide and hydrate.

## 2012-02-28 NOTE — Progress Notes (Signed)
  Subjective:    Patient ID: Katherine Christensen, female    DOB: 30-Mar-1941, 71 y.o.   MRN: [id number]  HPI  71 year old F with DM, HTN, HLD, and chronic diarrhea who presents for evaluation of her diarrhea and vertigo symptoms.   1. Diarrhea - 3 episode this morning, not accompanied by nausea, vomiting, hematochezia, melena or fever; no abnormal foods and no sick contacts; she has diarrhea frequently secondary to metformin and believes that this may be the cause; usually resolves with loperamide which she has not taken today  2. Vertigo - She has intermittent symptoms of feeling like she is "falling" when she turns her head to the right while getting into bed. This has not occurred for several months, but it is very bothersome to her when it does. It resolves spontaneously, and she avoids the same action to precipitated it for a while. The patient has never seen a physician or tried treatment for this. She recently went to a seminar at [facility] about Benign Positional Paroxysmal Vertigo, and she believes this is what she has.   Review of Systems  All other systems reviewed and are negative.       Objective:   Physical Exam BP 136/80  Pulse 83  Ht 5\' 2"  (1.575 m)  Wt 241 lb (109.317 kg)  BMI 44.08 kg/m2 Gen: alert, oriented, non-distressed, obese middle age AAF, very pleasant CV: RRR, no murmurs, 2+ peripheral pulses Abd: NDNT, hypoactive bowel sounds throughout, no masses Neuro: CN II-XII intact, no nystagmus, negative Dix-Hallpike Maneuvers      Assessment & Plan:

## 2012-03-08 ENCOUNTER — Ambulatory Visit (INDEPENDENT_AMBULATORY_CARE_PROVIDER_SITE_OTHER): Payer: Medicare Other | Admitting: Sports Medicine

## 2012-03-08 VITALS — BP 170/90 | Ht 62.0 in | Wt 240.0 lb

## 2012-03-08 DIAGNOSIS — M1710 Unilateral primary osteoarthritis, unspecified knee: Secondary | ICD-10-CM

## 2012-03-08 NOTE — Progress Notes (Signed)
  Subjective:    Patient ID: Katherine Christensen, female    DOB: [birth year], 71 y.o.   MRN: [id number]  HPI Patient is a 71 y/o female with PMH of DM, HTN, HLD, Gout, and OA who presents for f/u of knee pain. Patient was last seen in the office about 6 months ago. In the past she has received a series of suprax injections to the right knee as well as steroid injection to left knee. Patient states that injections previously helped her pain temporarily but pain has returned and is very severe. Pain is bilateral, R > L. She has pain in the right knee medially, anteriorly, and posteriorly. She states that it is very difficult to get out of a chair d/t pain. This makes her job difficult for her. She also says the pain is so severe at night that she cannot sleep. She rates the pain in her right knee 8-9/10. There has been no locking or giving out. Her left knee also hurts, but the pain comes and goes. She has more severe left knee pain when getting out of chair but day to day the pain is 4-5/10. She states that she is "fed up" with her knees and is ready to consider knee replacement.  Patient also c/o right hand pain x 1 week. The pain is over her 3rd right metacarpal head. She previously had trigger finger of that digit and had surgery for this. She says the hand hurts with fine movements of the finger, such as braiding her granddaughter's hair. There is no catching.    Review of Systems     Objective:   Physical Exam Gen: Alert, oriented, well groomed, NAD HEENT: Sclera nonicteric, conjunctiva noninjected Resp: No increased WOB MSK: - Right knee: Pain on palpation of medial joint line. TTP on posterior knee, Baker's cyst palpable. ROM limited on both flexion and extension with loss of approx 10-15 degrees of extension.  Flexion limited to approx 100 degrees. No ligamentous laxity on varus, valgus, or lachman's. Negative McMurrays. Effusion not appreciated, limited by body habitus -Left knee: Some pain on  palpation of medial and lateral joint lines. No appreciable effusion. Full ROM in knee flexion and extension. 5/5 strength. Negative McMurrays. No ligamentous laxity. - Right Hand. No swelling or deformity. Full ROM on digital flexion and extension. 5/5 strength on squeeze test. No TTP over metacarpals. There is TTP on palpation of the space between the 3rd and 4th metacarpal heads with no nodule or deformity.  Xrays reviewed from 2011: severe medial and patellar compartment joint space narrowing.     Assessment & Plan:  #1. Right knee pain d/t severe osteoarthritis with severe medial and patellar joint space narrowing. Given patient's severe pain, extensive arthritis and joint space narrowing on xray from 2 years ago, and failure to improve after suprax and steroid injections patient is an appropriate candidate for consideration of knee replacement.   - Referral to Dr. Dion Saucier today - Discussed with patient importance of improving diabetes control if she is deemed to be a good surgical candidate to promote healing - Start water aerobics  #2. Right Hand pain. Unclear etiology at this time. We will observe for now and patient will return if pain continues or worsens.

## 2012-03-08 NOTE — Progress Notes (Signed)
Scheduled pt for appt with Dr. Dion Saucier 03/10/12 at 9:30am.

## 2012-03-11 ENCOUNTER — Telehealth: Payer: Self-pay | Admitting: Family Medicine

## 2012-03-11 NOTE — Telephone Encounter (Signed)
Left message for the patient stating that I received a note from [facility] asking me to perform a pre-operative assessment on Katherine Christensen. I asked the patient to schedule an appointment with me to have this completed.

## 2012-03-12 NOTE — Telephone Encounter (Signed)
Pt will have surgery in December and will call in November to have her pre-op done with Dr Jani Gravel

## 2012-03-25 ENCOUNTER — Ambulatory Visit (INDEPENDENT_AMBULATORY_CARE_PROVIDER_SITE_OTHER): Payer: Medicare Other | Admitting: Sports Medicine

## 2012-03-25 VITALS — BP 140/70 | Ht 62.0 in | Wt 239.0 lb

## 2012-03-25 DIAGNOSIS — M1710 Unilateral primary osteoarthritis, unspecified knee: Secondary | ICD-10-CM

## 2012-03-25 DIAGNOSIS — M25569 Pain in unspecified knee: Secondary | ICD-10-CM

## 2012-03-25 DIAGNOSIS — M1711 Unilateral primary osteoarthritis, right knee: Secondary | ICD-10-CM

## 2012-03-26 NOTE — Progress Notes (Signed)
  Subjective:    Patient ID: Katherine Christensen, female    DOB: 14-Jan-1941, 71 y.o.   MRN: [id number]  HPI chief complaint: Right knee pain  Patient comes in today with persistent right knee pain. She is requesting a cortisone injection. She recently saw Dr. Dion Saucier who is planning on doing a total knee replacement in January. Prior x-rays have shown end-stage medial compartment DJD of his knee. During her office visit with Dr. Dion Saucier she had her left knee injected with cortisone but not the right. She's had both cortisone and Supartz injections in the past. She denies any recent trauma. She does get intermittent swelling and pain at night.    Review of Systems     Objective:   Physical Exam Well-developed, well-nourished. No acute distress  Right knee shows range of motion from 0-120. She is tender to palpation along the medial joint line with pain but no popping with McMurray's. 1-2+ patellofemoral crepitus. 1+ boggy synovitis. Good joint stability. Neurovascular intact distally. Walking with an obvious limp.       Assessment & Plan:  1. Right knee pain secondary to end-stage DJD  Patient's right knee was injected with a 4-1 Marcaine/Depo-Medrol combination. This was done using an anterior lateral approach. Patient tolerated the procedure without difficulty. We've encouraged when necessary over-the-counter ibuprofen and ice as needed. She understands that definitive treatment is a total knee arthroplasty she'll proceed with that in January as scheduled with Dr. Dion Saucier. Followup with me when necessary.  Consent obtained and verified. Time-out conducted. Noted no overlying erythema, induration, or other signs of local infection. Skin prepped in a sterile fashion. Topical analgesic spray: Ethyl chloride. Joint: Right Knee Needle: 25g 11/2 inch Completed without difficulty. Meds: 1cc Depo Medrol and 4cc 0.5% Marcaine Advised to call if fevers/chills, erythema, induration, drainage, or  persistent bleeding.

## 2012-03-29 ENCOUNTER — Ambulatory Visit: Payer: Medicare Other | Admitting: Sports Medicine

## 2012-04-02 ENCOUNTER — Encounter: Payer: Self-pay | Admitting: Family Medicine

## 2012-04-06 ENCOUNTER — Encounter: Payer: Self-pay | Admitting: Family Medicine

## 2012-04-06 ENCOUNTER — Ambulatory Visit (INDEPENDENT_AMBULATORY_CARE_PROVIDER_SITE_OTHER): Payer: Medicare Other | Admitting: Family Medicine

## 2012-04-06 VITALS — BP 142/70 | HR 69 | Temp 98.2°F | Ht 62.0 in | Wt 241.0 lb

## 2012-04-06 DIAGNOSIS — M62838 Other muscle spasm: Secondary | ICD-10-CM

## 2012-04-06 MED ORDER — CYCLOBENZAPRINE HCL 5 MG PO TABS: 5.0000 mg | ORAL_TABLET | Freq: Three times a day (TID) | ORAL | Status: DC | PRN

## 2012-04-06 NOTE — Progress Notes (Signed)
  Subjective:    Patient ID: Katherine Christensen, female    DOB: September 01, 1940, 71 y.o.   MRN: [id number]  HPI  71 year old F with obesity, HTN, HLD, Type 2 DM, and knee osteoarthritis.   Back Pain - Right subscapular and right neck, started Saturday, feels like a cramp and sharp, denies trauma, exacerbated by lifting, mildly relieved by warm shower, denies weakness of upper extremities and grip, denies numbness and tingling of her extremities  Review of Systems Negative for chest pain, shortness of breath, abdominal pain, shoulder pain     Objective:   Physical Exam BP 142/70  Pulse 69  Temp 98.2 F (36.8 C) (Oral)  Ht 5\' 2"  (1.575 m)  Wt 241 lb (109.317 kg)  BMI 44.08 kg/m2 Gen: elderly AAF, NAD, non ill, alert and oriented, pleasant and conversant MSK: neck without normal lordosis and chin held forward, TTP and significant muscle tension along trapezius bilaterally, decreased ROM of neck with right rotation and normal neck, positive Spurlings on the right, but not the left; normal ROM of shoulders bilaterally Neuro: 2+ DTR of biceps bilaterally, 5/5 strength or upper extremities      Assessment & Plan:  71 year old F with cervical paraspinal muscle spasm.

## 2012-04-06 NOTE — Patient Instructions (Addendum)
Dear Mrs. Placzek,   Thank you for coming to clinic today. Please read below regarding the issues that we discussed.   Neck and back pain - This is likely related to muscle spasms which occur from poor posture over a prolonged period. Please try stretching exercises as well as massage, heating pad, and warm baths. Additionally, I will give you a prescription for Flexeril. This is a muscle relaxer to be taken at night as needed.   Please follow up in clinic in 4 weeks. Please call earlier if you have any questions or concerns.   Sincerely,   Dr. Clinton Sawyer

## 2012-04-08 DIAGNOSIS — M62838 Other muscle spasm: Secondary | ICD-10-CM | POA: Insufficient documentation

## 2012-04-08 NOTE — Assessment & Plan Note (Signed)
No evidence of neuro involvement. Try conservative approach with massage, heat, stretching and flexeril PRN. Handout given.

## 2012-04-09 ENCOUNTER — Encounter: Payer: Self-pay | Admitting: Family Medicine

## 2012-05-10 ENCOUNTER — Encounter: Payer: Self-pay | Admitting: Home Health Services

## 2012-05-12 ENCOUNTER — Encounter: Payer: Self-pay | Admitting: Home Health Services

## 2012-07-19 ENCOUNTER — Other Ambulatory Visit: Payer: Self-pay | Admitting: Family Medicine

## 2012-07-19 DIAGNOSIS — Z1231 Encounter for screening mammogram for malignant neoplasm of breast: Secondary | ICD-10-CM

## 2012-07-30 ENCOUNTER — Ambulatory Visit (INDEPENDENT_AMBULATORY_CARE_PROVIDER_SITE_OTHER): Payer: Medicare Other | Admitting: Family Medicine

## 2012-07-30 ENCOUNTER — Telehealth: Payer: Self-pay | Admitting: Family Medicine

## 2012-07-30 VITALS — BP 140/72 | HR 84 | Ht 62.0 in | Wt 241.7 lb

## 2012-07-30 DIAGNOSIS — E1165 Type 2 diabetes mellitus with hyperglycemia: Secondary | ICD-10-CM

## 2012-07-30 MED ORDER — INSULIN GLARGINE 100 UNIT/ML ~~LOC~~ SOLN: 10.0000 [IU] | Freq: Every morning | SUBCUTANEOUS | Status: DC

## 2012-07-30 MED ORDER — INSULIN GLARGINE 100 UNIT/ML ~~LOC~~ SOLN: 5.0000 [IU] | Freq: Every morning | SUBCUTANEOUS | Status: DC

## 2012-07-30 MED ORDER — GLUCOSE BLOOD VI STRP: ORAL_STRIP | Status: DC

## 2012-07-30 NOTE — Telephone Encounter (Signed)
Called patient to inform her that she had an A1C of 7.8 and not 10. Therefore, she needs to start her Lantus at 5 units daily instead of 10. Also, she should take it on Monday and not Saturday. She was not available, so I left a message. I will try to call her again tomorrow.

## 2012-07-30 NOTE — Patient Instructions (Addendum)
Dear Ms. Barry Dienes,   Congratulations on your anniversary! That is quite an accomplishment. I'm also glad we have decided to focus on insulin for your diabetes.   Let's start with insulin 10 units tomorrow morning. You can then increase your dose by 1 unit each day until your fasting blood sugar is below 150 consistently. Please also check your blood sugar at night before bed. If it is ever below 80, then have a small snack.   Please follow with Dr. Raymondo Band in the pharmacy clinic in 2-3 weeks. He is a great help with diabetes and insulin management. Please follow up with me 1-2 weeks after you meet with Dr. Raymondo Band.   Have a great weekend.   Dr. Clinton Sawyer

## 2012-07-30 NOTE — Progress Notes (Signed)
  Subjective:    Patient ID: Katherine Christensen, female    DOB: 1941-03-23, 72 y.o.   MRN: [id number]  HPI  Diabetes Management - Patient previously seeing Dr. Talmage Nap, Endocrinologist, for this issues, but would like to transfer diabetes care to me.   Patient does not want to continue to use Metformin due to GI upset, which has been ocurring since 1997. This prevents her from taking it everyday. She is also on glimepiride 4 mg BID. She notes a past history of using insulin NPH 7.5 units once daily. She stopped this on her own accord. Does feel like sugars have been high recently, because she has been urinating frequently.   Review of Systems See HPI    Objective:   Physical Exam BP 140/72  Pulse 84  Ht 5\' 2"  (1.575 m)  Wt 241 lb 11.2 oz (109.634 kg)  BMI 44.21 kg/m2 Gen: obese elderly AAF, non ill appearing, pleasant and conversant      Assessment & Plan:  Patient counseled for approximately 20 minutes regarding insulin use and plan for hypoglycemia. Has all supplies needed except test strips.

## 2012-08-01 NOTE — Assessment & Plan Note (Addendum)
Improved A1C to 7.8. Nonetheless, patient wants transition to insulin. Start Lantus 5 units q AM, increase 1 unit per day each day for fasting glucose > 150. Will decrease by 2 units if fasting CBG < 80. Follow up in 2 weeks with Dr. Raymondo Band.

## 2012-08-02 ENCOUNTER — Other Ambulatory Visit: Payer: Self-pay | Admitting: *Deleted

## 2012-08-02 ENCOUNTER — Telehealth: Payer: Self-pay | Admitting: Family Medicine

## 2012-08-02 ENCOUNTER — Encounter: Payer: Self-pay | Admitting: Family Medicine

## 2012-08-02 DIAGNOSIS — I10 Essential (primary) hypertension: Secondary | ICD-10-CM

## 2012-08-02 MED ORDER — LOSARTAN POTASSIUM 100 MG PO TABS: 100.0000 mg | ORAL_TABLET | Freq: Every day | ORAL | Status: DC

## 2012-08-02 NOTE — Telephone Encounter (Signed)
Left message to confirm receipt of previous message that she needs to start Lantus at 5 units daily and increase by 1 unit daily for fasting CBG > 150.

## 2012-08-06 ENCOUNTER — Encounter: Payer: Self-pay | Admitting: Family Medicine

## 2012-08-13 ENCOUNTER — Ambulatory Visit: Payer: Medicare Other | Admitting: Pharmacist

## 2012-08-14 ENCOUNTER — Other Ambulatory Visit: Payer: Self-pay

## 2012-08-17 ENCOUNTER — Telehealth: Payer: Self-pay | Admitting: Family Medicine

## 2012-08-17 NOTE — Telephone Encounter (Signed)
Patient is calling because she has a yeast infection that she says is a regular thing for her and she would like an Rx for Diflucan sent to [facility].

## 2012-08-17 NOTE — Telephone Encounter (Signed)
Will fwd. To PCP for review .Bow Buntyn  

## 2012-08-18 ENCOUNTER — Encounter: Payer: Self-pay | Admitting: Family Medicine

## 2012-08-18 NOTE — Telephone Encounter (Signed)
Returned call to patient.  Patient took Diflucan pill and has been using a cream for her yeast infection.  Symptoms are improving.  Patient wants to know if she needs to increase her Lantus.  CBGs have been 200+ and patient had been increasing one unit daily.  Patient not sure if elevated CBGs are due to her yeast infection.  Wants to know if she should continue to increase Lantus or wait until her yeast infection resolves.  Will route note to Dr. Clinton Sawyer for advice and call patient back later today.  Gaylene Brooks, RN

## 2012-08-18 NOTE — Telephone Encounter (Signed)
Patient contacted this morning via Mychart. She has a diflucan pill at home which she will try and if ineffective, then I'll send another rx to pharmacy.

## 2012-08-25 ENCOUNTER — Ambulatory Visit: Payer: Medicare Other

## 2012-08-26 ENCOUNTER — Ambulatory Visit (INDEPENDENT_AMBULATORY_CARE_PROVIDER_SITE_OTHER): Payer: Medicare Other | Admitting: Pharmacist

## 2012-08-26 ENCOUNTER — Ambulatory Visit: Payer: Medicare Other | Admitting: Pharmacist

## 2012-08-26 VITALS — BP 148/84 | HR 86 | Ht 63.0 in | Wt 238.5 lb

## 2012-08-26 DIAGNOSIS — E119 Type 2 diabetes mellitus without complications: Secondary | ICD-10-CM

## 2012-08-26 NOTE — Patient Instructions (Addendum)
It was nice seeing you today  1. Increase the Lantus to 20 units daily and increase dose by 1 unit daily until blood sugar is <120 2. Check blood sugar daily in the morning, and a few times a week before lunch or dinner 3. We will see you back in 3-4 weeks

## 2012-08-26 NOTE — Assessment & Plan Note (Addendum)
Diabetes of 20+ yrs duration currently under fair control of blood glucose based on home fasting CBG readings of 225-280. Control is suboptimal due to increased insulin needs. Denies hypoglycemic events.  Able to verbalize appropriate hypoglycemia management plan. Increased dose of basal insulin Lantus to 20 units daily (insulin glargine). Patient will continue to titrate 1 unit/day if fasting CBGs > 120mg /dl until fasting CBGs reach goal or next visit.  Written patient instructions provided.  Follow up in  Pharmacist Clinic Visit in 3-4 weeks.   Total time in face to face counseling 45 minutes.  Patient seen with Drue Stager PharmD, Pharmacy resident.

## 2012-08-26 NOTE — Progress Notes (Signed)
HPI: Pt presents in good spirits for management of her insulin. She had previously been on NPH 5 units in am and 15 units and night, but experienced occasional hypoglycemia in the am requiring switch to Lantus. She has been titrating her Lantus dose since previous visit and is currently up to 11 units daily. She denies any hypoglycemia since making the change.  Diabetes of 20+ yrs duration currently under fair control of blood glucose based on home fasting CBG readings of 225-280. Control is suboptimal due to increased insulin needs. Denies hypoglycemic events.  Able to verbalize appropriate hypoglycemia management plan. Increased dose of basal insulin Lantus to 20 units daily (insulin glargine). Patient will continue to titrate 1 unit/day if fasting CBGs > 120mg /dl until fasting CBGs reach goal or next visit.  Written patient instructions provided.  Follow up in  Pharmacist Clinic Visit in 3-4 weeks.   Total time in face to face counseling 45 minutes.  Patient seen with Drue Stager PharmD, Pharmacy resident.

## 2012-08-27 ENCOUNTER — Encounter: Payer: Self-pay | Admitting: Pharmacist

## 2012-08-27 NOTE — Progress Notes (Signed)
Patient ID: Katherine Christensen, female   DOB: [birth year], 72 y.o.   MRN: [id number] Reviewed: Agree with Dr. Macky Lower documentation and management.

## 2012-08-28 ENCOUNTER — Encounter (HOSPITAL_COMMUNITY): Payer: Self-pay | Admitting: *Deleted

## 2012-08-28 ENCOUNTER — Emergency Department (INDEPENDENT_AMBULATORY_CARE_PROVIDER_SITE_OTHER)
Admission: EM | Admit: 2012-08-28 | Discharge: 2012-08-28 | Disposition: A | Discharge status: Home / Self Care | Payer: Medicare Other | Source: Home / Self Care | Attending: Emergency Medicine | Admitting: Emergency Medicine

## 2012-08-28 ENCOUNTER — Emergency Department (INDEPENDENT_AMBULATORY_CARE_PROVIDER_SITE_OTHER): Payer: Medicare Other

## 2012-08-28 DIAGNOSIS — J209 Acute bronchitis, unspecified: Secondary | ICD-10-CM

## 2012-08-28 DIAGNOSIS — R739 Hyperglycemia, unspecified: Secondary | ICD-10-CM

## 2012-08-28 DIAGNOSIS — E119 Type 2 diabetes mellitus without complications: Secondary | ICD-10-CM

## 2012-08-28 DIAGNOSIS — J111 Influenza due to unidentified influenza virus with other respiratory manifestations: Secondary | ICD-10-CM

## 2012-08-28 LAB — POCT I-STAT, CHEM 8
BUN: <3 mg/dL — ABNORMAL LOW (ref 6–23)
Creatinine, Ser: 0.6 mg/dL (ref 0.50–1.10)
Sodium: 136 mEq/L (ref 135–145)
TCO2: 25 mmol/L (ref 0–100)

## 2012-08-28 MED ORDER — ALBUTEROL SULFATE HFA 108 (90 BASE) MCG/ACT IN AERS: 1.0000 | INHALATION_SPRAY | Freq: Four times a day (QID) | RESPIRATORY_TRACT | Status: DC | PRN

## 2012-08-28 MED ORDER — BENZONATATE 200 MG PO CAPS: 200.0000 mg | ORAL_CAPSULE | Freq: Three times a day (TID) | ORAL | Status: DC | PRN

## 2012-08-28 MED ORDER — OSELTAMIVIR PHOSPHATE 75 MG PO CAPS
75.0000 mg | ORAL_CAPSULE | Freq: Two times a day (BID) | ORAL | Status: DC
Start: 2012-08-28 — End: 2012-09-16

## 2012-08-28 NOTE — ED Notes (Signed)
Started noticing foul-tasting phlegm in throat on Mon; on Wed started with fevers up to 101, cough (slightly productive), nasal congestion, runny nose, body aches.  Has tried Tussin without relief; has been using Halls cough drops, drinking plenty of fluids.  Faint crackles noted in right mid lung.

## 2012-08-28 NOTE — ED Provider Notes (Signed)
Chief Complaint  Patient presents with  . Cough  . Nasal Congestion    History of Present Illness:   Katherine Christensen  is a 72 year old female who has had a four-day history of cough productive of clear sputum, fever up to 101, sore throat, and nasal congestion with clear drainage. She denies any wheezing or chest pain. She's had no GI symptoms.   Her secondary complaint has been that of diabetes which has not been well controlled recently. She's had type 2 diabetes since 1996. She has just been started on Lantus insulin which is gradually being increased. Her current dose is 20 units a day in the morning. Her sugars were as high as 310 the day before yesterday, she did not know what it was yesterday, and did not check it today. She does note polyuria no polydipsia, blurry vision, abdominal pain, nausea, or vomiting. She is allergic to penicillin. She also takes losartan for her blood pressure. She has no other illnesses.  Review of Systems:  Other than noted above, the patient denies any of the following symptoms. Systemic:  No fever, chills, sweats, fatigue, myalgias, headache, or anorexia. Eye:  No redness, pain or drainage. ENT:  No earache, ear congestion, nasal congestion, sneezing, rhinorrhea, sinus pressure, sinus pain, post nasal drip, or sore throat. Lungs:  No cough, sputum production, wheezing, shortness of breath, or chest pain. GI:  No abdominal pain, nausea, vomiting, or diarrhea.  PMFSH:  Past medical history, family history, social history, meds, and allergies were reviewed.  Physical Exam:   Vital signs:  BP 134/74  Pulse 75  Temp(Src) 100.9 F (38.3 C) (Oral)  Resp 20  SpO2 95% General:  Alert, in no distress. Eye:  No conjunctival injection or drainage. Lids were normal. ENT:  TMs and canals were normal, without erythema or inflammation.  Nasal mucosa was clear and uncongested, without drainage.  Mucous membranes were moist.  Pharynx was clear, without exudate or  drainage.  There were no oral ulcerations or lesions. Neck:  Supple, no adenopathy, tenderness or mass. Lungs:  No respiratory distress.  Lungs were clear to auscultation, without wheezes, rales or rhonchi.  Breath sounds were clear and equal bilaterally.  Heart:  Regular rhythm, without gallops, murmers or rubs. Skin:  Clear, warm, and dry, without rash or lesions.  Labs:   Results for orders placed during the hospital encounter of 08/28/12  POCT I-STAT, CHEM 8      Result Value Range   Sodium 136  135 - 145 mEq/L   Potassium 3.6  3.5 - 5.1 mEq/L   Chloride 101  96 - 112 mEq/L   BUN <3 (*) 6 - 23 mg/dL   Creatinine, Ser 2.95  0.50 - 1.10 mg/dL   Glucose, Bld 621 (*) 70 - 99 mg/dL   Calcium, Ion 3.08 (*) 1.13 - 1.30 mmol/L   TCO2 25  0 - 100 mmol/L   Hemoglobin 15.6 (*) 12.0 - 15.0 g/dL   HCT 65.7  84.6 - 96.2 %    Radiology:  Dg Chest 2 View  08/28/2012  *RADIOLOGY REPORT*  Clinical Data: Cough, fever.  CHEST - 2 VIEW  Comparison: None  Findings: Diffuse peribronchial thickening. Heart and mediastinal contours are within normal limits.  No focal opacities or effusions.  No acute bony abnormality.  IMPRESSION: Bronchitic changes.   Original Report Authenticated By: Charlett Nose, M.D.    I reviewed the images independently and personally and concur with the radiologist's findings.  Assessment:  The primary encounter diagnosis was Influenza-like illness. Diagnoses of Acute bronchitis, Type 2 diabetes mellitus, and Hyperglycemia were also pertinent to this visit.  Plan:   1.  The following meds were prescribed:   Discharge Medication List as of 08/28/2012  1:57 PM    START taking these medications   Details  albuterol (PROVENTIL HFA;VENTOLIN HFA) 108 (90 BASE) MCG/ACT inhaler Inhale 1-2 puffs into the lungs every 6 (six) hours as needed for wheezing., Starting 08/28/2012, Until Discontinued, Normal    benzonatate (TESSALON) 200 MG capsule Take 1 capsule (200 mg total) by mouth 3 (three)  times daily as needed for cough., Starting 08/28/2012, Until Discontinued, Normal    oseltamivir (TAMIFLU) 75 MG capsule Take 1 capsule (75 mg total) by mouth every 12 (twelve) hours., Starting 08/28/2012, Until Discontinued, Normal       She was told to increase her Lantus dose to 22 units a day in the morning and to call her primary care physician to report on her blood sugar levels early next week. If her blood sugar should continue to increase, suggest she come back here tomorrow or go to the emergency room.  2.  The patient was instructed in symptomatic care and handouts were given. 3.  The patient was told to return if becoming worse in any way, if no better in 3 or 4 days, and given some red flag symptoms such as worsening fever, difficulty breathing, chest pain, or vomiting that would indicate earlier return.   Reuben Likes, MD 08/28/12 [phone]

## 2012-08-30 ENCOUNTER — Encounter: Payer: Self-pay | Admitting: Family Medicine

## 2012-08-31 ENCOUNTER — Ambulatory Visit: Payer: Medicare Other

## 2012-08-31 ENCOUNTER — Ambulatory Visit
Admission: RE | Admit: 2012-08-31 | Discharge: 2012-08-31 | Disposition: A | Discharge status: Home / Self Care | Payer: Medicare Other | Source: Ambulatory Visit | Attending: Family Medicine | Admitting: Family Medicine

## 2012-08-31 DIAGNOSIS — Z1231 Encounter for screening mammogram for malignant neoplasm of breast: Secondary | ICD-10-CM

## 2012-09-01 ENCOUNTER — Telehealth: Payer: Self-pay | Admitting: Family Medicine

## 2012-09-01 ENCOUNTER — Ambulatory Visit (INDEPENDENT_AMBULATORY_CARE_PROVIDER_SITE_OTHER): Payer: Medicare Other | Admitting: Family Medicine

## 2012-09-01 ENCOUNTER — Encounter: Payer: Self-pay | Admitting: Family Medicine

## 2012-09-01 VITALS — BP 146/68 | HR 92 | Temp 98.5°F | Ht 62.0 in | Wt 235.0 lb

## 2012-09-01 DIAGNOSIS — J111 Influenza due to unidentified influenza virus with other respiratory manifestations: Secondary | ICD-10-CM

## 2012-09-01 DIAGNOSIS — E1165 Type 2 diabetes mellitus with hyperglycemia: Secondary | ICD-10-CM | POA: Insufficient documentation

## 2012-09-01 DIAGNOSIS — I10 Essential (primary) hypertension: Secondary | ICD-10-CM

## 2012-09-01 DIAGNOSIS — E119 Type 2 diabetes mellitus without complications: Secondary | ICD-10-CM

## 2012-09-01 DIAGNOSIS — B373 Candidiasis of vulva and vagina: Secondary | ICD-10-CM

## 2012-09-01 DIAGNOSIS — B3731 Acute candidiasis of vulva and vagina: Secondary | ICD-10-CM | POA: Insufficient documentation

## 2012-09-01 DIAGNOSIS — E1140 Type 2 diabetes mellitus with diabetic neuropathy, unspecified: Secondary | ICD-10-CM | POA: Insufficient documentation

## 2012-09-01 MED ORDER — GLIPIZIDE ER 5 MG PO TB24: 5.0000 mg | ORAL_TABLET | Freq: Every day | ORAL | Status: DC

## 2012-09-01 NOTE — Assessment & Plan Note (Signed)
A/P: She declined GU exam today. Recurrent from her hx. Likely related to her poorly controlled DM. Good hygiene advised. Monistat & recommended. She is advised to return if symptom persist or worsen.

## 2012-09-01 NOTE — Telephone Encounter (Signed)
If she is feeling ill or having continued fevers and/or hyperglycemic episodes, she should be scheduled for an office visit.  JB

## 2012-09-01 NOTE — Patient Instructions (Signed)
Diabetes, Eating Away From Home  Sometimes, you might eat in a restaurant or have meals that are prepared by someone else. You can enjoy eating out. However, the portions in restaurants may be much larger than needed. Listed below are some ideas to help you choose foods that will keep your blood glucose (sugar) in better control.   TIPS FOR EATING OUT  · Know your meal plan and how many carbohydrate servings you should have at each meal. You may wish to carry a copy of your meal plan in your purse or wallet. Learn the foods included in each food group.  · Make a list of restaurants near you that offer healthy choices. Take a copy of the carry-out menus to see what they offer. Then, you can plan what you will order ahead of time.  · Become familiar with serving sizes by practicing them at home using measuring cups and spoons. Once you learn to recognize portion sizes, you will be able to correctly estimate the amount of total carbohydrate you are allowed to eat at the restaurant. Ask for a takeout box if the portion is more than you should have. When your food comes, leave the amount you should have on the plate, and put the rest in the takeout box before you start eating.  · Plan ahead if your mealtime will be different from usual. Check with your caregiver to find out how to time meals and medicine if you are taking insulin.  · Avoid high-fat foods, such as fried foods, cream sauces, high-fat salad dressings, or any added butter or margarine.  · Do not be afraid to ask questions. Ask your server about the portion size, cooking methods, ingredients and if items can be substituted. Restaurants do not list all available items on the menu. You can ask for your main entree to be prepared using skim milk, oil instead of butter or margarine, and without gravy or sauces. Ask your waiter or waitress to serve salad dressings, gravy, sauces, margarine, and sour cream on the side. You can then add the amount your meal plan  suggests.  · Add more vegetables whenever possible.  · Avoid items that are labeled "jumbo," "giant," "deluxe," or "supersized."  · You may want to split an entrée with someone and order an extra side salad.  · Watch for hidden calories in foods like croutons, bacon, or cheese.  · Ask your server to take away the bread basket or chips from your table.  · Order a dinner salad as an appetizer.  You can eat most foods served in a restaurant. Some foods are better choices than others.  Breads and Starches  · Recommended: All kinds of bread (wheat, rye, white, oatmeal, Italian, French, raisin), hard or soft dinner rolls, frankfurter or hamburger buns, small bagels, small corn or whole-wheat flour tortillas.  · Avoid: Frosted or glazed breads, butter rolls, egg or cheese breads, croissants, sweet rolls, pastries, coffee cake, glazed or frosted doughnuts, muffins.  Crackers  · Recommended: Animal crackers, graham, rye, saltine, oyster, and matzoth crackers. Bread sticks, melba toast, rusks, pretzels, popcorn (without fat), zwieback toast.  · Avoid: High-fat snack crackers or chips. Buttered popcorn.  Cereals  · Recommended: Hot and cold cereals. Whole grains such as oatmeal or shredded wheat are good choices.  · Avoid: Sugar-coated or granola type cereals.  Potatoes/Pasta/Rice/Beans  · Recommended: Order baked, boiled, or mashed potatoes, rice or noodles without added fat, whole beans. Order gravies, butter, margarine, or sauces   on the side so you can control the amount you add.  · Avoid: Hash browns or fried potatoes. Potatoes, pasta, or rice prepared with cream or cheese sauce. Potato or pasta salads prepared with large amounts of dressing. Fried beans or fried rice.  Vegetables  · Recommended: Order steamed, baked, boiled, or stewed vegetables without sauces or extra fat. Ask that sauce be served on the side. If vegetables are not listed on the menu, ask what is available.  · Avoid: Vegetables prepared with cream,  butter, or cheese sauce. Fried vegetables.  Salad Bars  · Recommended: Many of the vegetables at a salad bar are considered "free." Use lemon juice, vinegar, or low-calorie salad dressing (fewer than 20 calories per serving) as "free" dressings for your salad. Look for salad bar ingredients that have no added fat or sugar such as tomatoes, lettuce, cucumbers, broccoli, carrots, onions, and mushrooms.  · Avoid: Prepared salads with large amounts of dressing, such as coleslaw, caesar salad, macaroni salad, bean salad, or carrot salad.  Fruit  · Recommended: Eat fresh fruit or fresh fruit salad without added dressing. A salad bar often offers fresh fruit choices, but canned fruit at a restaurant is usually packed in sugar or syrup.  · Avoid: Sweetened canned or frozen fruits, plain or sweetened fruit juice. Fruit salads with dressing, sour cream, or sugar added to them.  Meat and Meat Substitutes  · Recommended: Order broiled, baked, roasted, or grilled meat, poultry, or fish. Trim off all visible fat. Do not eat the skin of poultry. The size stated on the menu is the raw weight. Meat shrinks by ¼ in cooking (for example, 4 oz raw equals 3 oz cooked meat).  · Avoid: Deep-fat fried meat, poultry, or fish. Breaded meats.  Eggs  · Recommended: Order soft, hard-cooked, poached, or scrambled eggs. Omelets may be okay, depending on what ingredients are added. Egg substitutes are also a good choice.  · Avoid: Fried eggs, eggs prepared with cream or cheese sauce.  Milk  · Recommended: Order low-fat or fat-free milk according to your meal plan. Plain, nonfat yogurt or flavored yogurt with no sugar added may be used as a substitute for milk. Soy milk may also be used.  · Avoid: Milk shakes or sweetened milk beverages.  Soups and Combination Foods  · Recommended: Clear broth or consommé are "free" foods and may be used as an appetizer. Broth-based soups with fat removed count as a starch serving and are preferred over cream  soups. Soups made with beans or split peas may be eaten but count as a starch.  · Avoid: Fatty soups, soup made with cream, cheese soup. Combination foods prepared with excessive amounts of fat or with cream or cheese sauces.  Desserts and Sweets  · Recommended: Ask for fresh fruit. Sponge or angel food cake without icing, ice milk, no sugar added ice cream, sherbet, or frozen yogurt may fit into your meal plan occasionally.  · Avoid: Pastries, puddings, pies, cakes with icing, custard, gelatin desserts.  Fats and Oils  · Recommended: Choose healthy fats such as olive oil, canola oil, or tub margarine, reduced fat or fat-free sour cream, cream cheese, avocado, or nuts.  · Avoid: Any fats in excess of your allowed portion. Deep-fried foods or any food with a large amount of fat.  Note: Ask for all fats to be served on the side, and limit your portion sizes according to your meal plan.  Document Released: 06/16/2005 Document Revised: 09/08/2011

## 2012-09-01 NOTE — Telephone Encounter (Signed)
Spoke with Dr. Lum Babe and she does want patient to be on Glipizide once daily.  Continue to monitor blood sugars and increase insulin as instructed . Patient notified and she voices understanding.

## 2012-09-01 NOTE — Assessment & Plan Note (Signed)
A/P: BP not optimal,although she stated her home BP has been good. I rechecked her BP with some improvement. She will continue current med,improve her diet and continue home BP check. F/U in 1 wk for reassessment,to consider increasing BP med dose if still elevated.

## 2012-09-01 NOTE — Telephone Encounter (Signed)
Pt miss understood the doctor today and thought she said glimepiride (AMARYL) 4 MG tablet Instead she got script for Glipizide - she really would rather have the glimepiride (AMARYL) 4 MG tablet   [facility]

## 2012-09-01 NOTE — Telephone Encounter (Signed)
Will forward message to Dr. Lum Babe to clarify.

## 2012-09-01 NOTE — Assessment & Plan Note (Signed)
A/P: DM is poorly controlled mostly due to poor compliance with her diet and exercise. I stressed need for diet control in addition to medication use. FS checked in the office today is about 300. I advised continue Lantus at 24 unit qd as increased today. I started her on Glipizide 5 mg XL qd. Continue FS at least twice daily and titrate Lantus up as previously instructed if FSBG continues to be higher than 120-140. She is to call as well is blood sugar is dropping. She is to f/u with PMD in 1 wk.Patient verbalized understanding.

## 2012-09-01 NOTE — Telephone Encounter (Signed)
Appointment scheduled for this AM at 11:00.

## 2012-09-01 NOTE — Progress Notes (Signed)
Subjective:     Patient ID: Katherine Christensen, female   DOB: 22-Jul-1940, 72 y.o.   MRN: [id number]  HPI [id number] was recently started on Lantus,she had been on Amaryl in the past which she had tolerated well,she d/c metformin due to GI intolerance.She has not had a good number for her finger stick check,FS checked this am is 252,yesterday she took 22 units of Lantus but this was increased to 24 units this morning since her FS was high,she was advised by her PMD to increase Lantus per her FS level.This morning she ate 2 thicken fluffy waffle,one egg and bacon with fruits.She already had foot exam at her Podiatrist's on Friday and she had eye exam done Oct 2013 as per patient. [id number] is compliant with her BP medication,she was a little stressed this morning and feels this might make her BP go up otherwise her BP has been good in the last few weeks. URI/FLU:She mentioned she recently had positive flu test but did not start her Tamiflu since she started feeling better,her cough has improved,still feels a little congested,she denies fever. Vulva Irritation: C/O vaginal irritation since last week,she used one dose of old Diflucan she had at home with some improvement,she denies any vulva redness or ulcers,she does have small whitish vaginal discharge on and off,she has had similar symptoms in the past and was treated as yeast infection.  Past Medical History  Diagnosis Date   Arrhythmia    Mild anemia    Osteoarthritis    Female pelvic pain    Mole (skin)    Borderline glaucoma    GERD (gastroesophageal reflux disease)    Type 2 diabetes mellitus    Hypertension    Obesity    Hyperlipidemia    Sleep apnea    MRSA (methicillin resistant staph aureus) culture positive    Systolic murmur    Hemorrhoids    Yeast vaginitis       Review of Systems  Constitutional: Negative for fever.  HENT: Positive for congestion.   Respiratory: Positive for cough. Negative for wheezing.    Cardiovascular: Negative.   Gastrointestinal: Negative.   Genitourinary: Positive for frequency. Negative for urgency.       Vulva irritation,very small whitish vaginal discharge occasionally.  All other systems reviewed and are negative.   Filed Vitals:   09/01/12 1057 09/01/12 1118  BP: 158/79 146/68  Pulse: 92   Temp: 98.5 F (36.9 C)   TempSrc: Oral   Height: 5\' 2"  (1.575 m)   Weight: 235 lb (106.595 kg)        Objective:   Physical Exam  Nursing note and vitals reviewed. Constitutional: She is oriented to person, place, and time. She appears well-developed. No distress.  Cardiovascular: Normal rate, regular rhythm and intact distal pulses.   No murmur heard. Pulmonary/Chest: Effort normal and breath sounds normal. No respiratory distress. She has no wheezes.  Abdominal: Soft. Bowel sounds are normal. She exhibits no distension and no mass. There is no tenderness.  Genitourinary:  Declined GU exam  Musculoskeletal: She exhibits no edema.  Neurological: She is alert and oriented to person, place, and time. No cranial nerve deficit.        Assessment/Plan:

## 2012-09-01 NOTE — Telephone Encounter (Signed)
Needs to talk to nurse about insulin problems - she thinks that the needle is too short and not getting the insulin in her.  pls advise

## 2012-09-01 NOTE — Telephone Encounter (Signed)
Will forward message to preceptor.

## 2012-09-01 NOTE — Assessment & Plan Note (Signed)
A/P: Symptom resolving. Continue Zicam prn cough. Return if symptom start to worsen or go to the ER. She verbalized understanding.

## 2012-09-01 NOTE — Telephone Encounter (Addendum)
Spoke with patient and at visit last week with Raymondo Band was instructed to increase Lantus to 20 units daily.  Last Friday 02/28 didn't feels well . Had fever 101, cold symptoms, cough, increased urination. Saturday  03/01 went to Urgent Care and was diagnosed with flu, bronchitis. BS while there was 298. Was instructed to increase insulin to 22 units daily.   Asked patient to report BS reading for past couple of days:  03/03     fasting  BS 312 then  Three  hours later  295.   03/04     Fasting 244 then rechecked a minute later and was 216 03/05      Fasting 254.   Insulin needle length is 6 mm she states . Also she has an accu check [employer] glucose meter that is fairly new and strips are in date.   I called pharmacy and longest length for needle is 8 mm   Continues having much head congestion, and yellow tinged nasal secretions. Was given and inhaler and Tamiflu at [facility] but has not taken or used.  Continues with cough.  Increased urination has improved but she does have vaginal irritation . Thought she had a yeast infection .  She had emailed Dr. Clinton Sawyer earlier this week

## 2012-09-04 ENCOUNTER — Encounter (HOSPITAL_COMMUNITY): Payer: Self-pay | Admitting: Emergency Medicine

## 2012-09-04 ENCOUNTER — Emergency Department (INDEPENDENT_AMBULATORY_CARE_PROVIDER_SITE_OTHER)
Admission: EM | Admit: 2012-09-04 | Discharge: 2012-09-04 | Disposition: A | Discharge status: Home / Self Care | Payer: Medicare Other | Source: Home / Self Care

## 2012-09-04 DIAGNOSIS — J0110 Acute frontal sinusitis, unspecified: Secondary | ICD-10-CM

## 2012-09-04 MED ORDER — DOXYCYCLINE HYCLATE 50 MG PO CAPS: 100.0000 mg | ORAL_CAPSULE | Freq: Two times a day (BID) | ORAL | Status: DC

## 2012-09-04 MED ORDER — CETIRIZINE HCL 10 MG PO CAPS: 1.0000 | ORAL_CAPSULE | Freq: Every evening | ORAL | Status: DC | PRN

## 2012-09-04 NOTE — ED Notes (Signed)
Pt c/o persistent cold  Seen here last week at the [facility]; given Tamiflu and albuterol that she did not take Worried b/c yesterday she noticed orange/yellow mucous when she blew her nose and was feeling very tired Denies: f/v/n/d  She is alert w/no signs of acute respiratory distress.

## 2012-09-04 NOTE — ED Notes (Signed)
Patient Demographics  Katherine Christensen, is a 72 y.o. female  ZOX:[id number]  WJX:[id number]  DOB - Nov 21, 1940  Chief Complaint  Patient presents with  . URI        Subjective:   Katherine Christensen today has, No headache, No chest pain, No abdominal pain - No Nausea, No new weakness tingling or numbness, No Cough - SOB. Here for left frontal sinus pressure and nasal discharge which is turning yellow for the last 2 days, low-grade fevers, mild frontal headache.  Objective:    Filed Vitals:   09/04/12 1210  BP: 157/80  Pulse: 80  Temp: 99.7 F (37.6 C)  TempSrc: Oral  Resp: 18  SpO2: 96%     Exam  Awake Alert, Oriented X 3, No new F.N deficits, Normal affect [place].AT,PERRAL Supple Neck,No JVD, No cervical lymphadenopathy appriciated.  Symmetrical Chest wall movement, Good air movement bilaterally, CTAB RRR,No Gallops,Rubs or new Murmurs, No Parasternal Heave +ve B.Sounds, Abd Soft, Non tender, No organomegaly appriciated, No rebound - guarding or rigidity. No Cyanosis, Clubbing or edema, No new Rash or bruise  Drip noted, no warmth or tenderness overlying left frontal sinus, or maxillary sinus. No cervical adenopathy.    Data Review   CBC  Recent Labs Lab 08/28/12 1338  HGB 15.6*  HCT 46.0    Chemistries    Recent Labs Lab 08/28/12 1338  NA 136  K 3.6  CL 101  GLUCOSE 298*  BUN <3*  CREATININE 0.60   ------------------------------------------------------------------------------------------------------------------ No results found for this basename: HGBA1C,  in the last 72 hours ------------------------------------------------------------------------------------------------------------------ No results found for this basename: CHOL, HDL, LDLCALC, TRIG, CHOLHDL, LDLDIRECT,  in the last 72 hours ------------------------------------------------------------------------------------------------------------------ No results found for this basename: TSH, T4TOTAL,  FREET3, T3FREE, THYROIDAB,  in the last 72 hours ------------------------------------------------------------------------------------------------------------------ No results found for this basename: VITAMINB12, FOLATE, FERRITIN, TIBC, IRON, RETICCTPCT,  in the last 72 hours  Coagulation profile  No results found for this basename: INR, PROTIME,  in the last 168 hours     Prior to Admission medications   Medication Sig Start Date End Date Taking? Authorizing Jillienne Egner  losartan (COZAAR) 100 MG tablet Take 1 tablet (100 mg total) by mouth daily. 08/02/12  Yes Garnetta Buddy, MD  albuterol (PROVENTIL HFA;VENTOLIN HFA) 108 (90 BASE) MCG/ACT inhaler Inhale 1-2 puffs into the lungs every 6 (six) hours as needed for wheezing. 08/28/12   Reuben Likes, MD  benzonatate (TESSALON) 200 MG capsule Take 1 capsule (200 mg total) by mouth 3 (three) times daily as needed for cough. 08/28/12   Reuben Likes, MD  Cetirizine HCl (ZYRTEC ALLERGY) 10 MG CAPS Take 1 capsule (10 mg total) by mouth at bedtime and may repeat dose one time if needed. 09/04/12   Leroy Sea, MD  doxycycline (VIBRAMYCIN) 50 MG capsule Take 2 capsules (100 mg total) by mouth 2 (two) times daily. 09/04/12   Leroy Sea, MD  glipiZIDE (GLUCOTROL XL) 5 MG 24 hr tablet Take 1 tablet (5 mg total) by mouth daily. 09/01/12   Janit Pagan, MD  glucose blood (ACCU-CHEK AVIVA PLUS) test strip Use as instructed 07/30/12   Garnetta Buddy, MD  GNP LANCETS MICRO THIN 33G MISC TEST BLOOD GLUCOSE TWICE DAILY 05/27/11   Edsel Petrin, DO  Homeopathic Products [facility] COLD REMEDY) LIQD Take by mouth.    Historical Bibiana Gillean, MD  insulin glargine (LANTUS) 100 UNIT/ML injection Inject 24 Units into the skin every morning. Increase by 1 unit each  day as need for fasting sugar greater than 150; has been using 17 units last few days. 07/30/12   Garnetta Buddy, MD  oseltamivir (TAMIFLU) 75 MG capsule Take 1 capsule (75 mg total) by mouth every  12 (twelve) hours. 08/28/12   Reuben Likes, MD     Assessment & Plan   Acute left frontal sinusitis- drainage becoming copious with change in color, low-grade temp, patient will be placed on seven-day course of doxycycline as she is allergic to penicillin, Zyrtec to alleviate knee allergic component, all of the PCP, if symptoms worsen with ENT. Information provided.    Follow-up Information   Follow up with Primary care Jazzalynn Rhudy. Schedule an appointment as soon as possible for a visit in 1 week.      Follow up with Dillard Cannon, MD. Schedule an appointment as soon as possible for a visit in 1 week. (As needed if symptoms worsen)    Contact information:   [address] [place] [address] [phone]        Leroy Sea M.D on 09/04/2012 at 12:41 PM   Leroy Sea, MD 09/04/12 [phone]

## 2012-09-06 ENCOUNTER — Telehealth: Payer: Self-pay | Admitting: *Deleted

## 2012-09-06 NOTE — Telephone Encounter (Signed)
PA required for lantus insulin. Form completed by MD and faxed to insurance company.

## 2012-09-08 NOTE — Telephone Encounter (Signed)
Approval received  from insurance through 06/29/2013.  PA reference # [id number].  Pharmacy notified.

## 2012-09-15 ENCOUNTER — Encounter: Payer: Self-pay | Admitting: Family Medicine

## 2012-09-15 ENCOUNTER — Ambulatory Visit (INDEPENDENT_AMBULATORY_CARE_PROVIDER_SITE_OTHER): Payer: Medicare Other | Admitting: Family Medicine

## 2012-09-15 VITALS — BP 137/80 | HR 77 | Ht 63.0 in | Wt 237.0 lb

## 2012-09-15 DIAGNOSIS — J069 Acute upper respiratory infection, unspecified: Secondary | ICD-10-CM

## 2012-09-15 NOTE — Patient Instructions (Addendum)
I am glad that you are feeling better! I wish I had been here to see you that week, but most importantly your body fought off the infection. Please follow up tomorrow with Dr. Raymondo Band and bring your meter.   Please see me in 2 months.   Take Care,   Dr. Raymondo Band

## 2012-09-15 NOTE — Progress Notes (Signed)
  Subjective:    Patient ID: Katherine Christensen, female    DOB: 10-05-1940, 72 y.o.   MRN: [id number]  HPI  72 year old lady with type 2 DM and recent URI/Bronchitis who presents for follow up.   Respiratory Infection - Patient seen in Urgent Care on 08/28/12 with fever, wheezing, and myalgias and was diagnosed with bronchitis and possible flu. Was given albuterol, tessalon, and tamiflu. She chose not to take any of the medication to see if she would improve on her own. On 09/06/12, she presented again to urgent care with continued fever and profuse sinus drainage and was diagnosed with bacterial sinusitis for which she was prescribed doxycycline. She did not take the antibiotic, because she was skeptical of taking the medication. She presents today for evaluation of her infections and to see if she needs to start any medications. She denies fever, chills, cough, dyspnea, sore throat, facial pain and nasal drainage. She feels "100%" better than last week.   Diabetes - Patient currently being started on new insulin regimen with Lantus. Her sugars were in the upper 200's while she was ill. She presented to the [facility] and was prescribed glipizide. She did not start the medication, because it was not prescribed by her primary physician or Dr. Raymondo Band, clinical pharmacist, who are managing her diabetes treatment. Additionally, she attributed the elevated CBG's to her infection. She is currently taking 27 units of Lantus daily and has follow up with Dr. Raymondo Band tomorrow.   Review of Systems She denies fever, chills, cough, dyspnea, sore throat, facial pain and nasal drainage.    Objective:   Physical Exam  BP 137/80  Pulse 77  Ht 5\' 3"  (1.6 m)  Wt 237 lb (107.502 kg)  BMI 41.99 kg/m2 Gen: obese AAF, very pleasant, well appearing HEENT: no maxillary or facial sinus tenderness, nostrils clear, TM reflective without effusions, OP clear and moist, no exudate  CV: RRR, no murmurs Pulm: CTA, normal work of  breathing      Assessment & Plan:  72 year old F with resolved respiratory and sinus infections. She was instructed not take the antibiotics or Tamiflu at this time, but notes that she will definitely get a flu shot next year. She needs follow up for her diabetes, which she has tomorrow.

## 2012-09-16 ENCOUNTER — Ambulatory Visit (INDEPENDENT_AMBULATORY_CARE_PROVIDER_SITE_OTHER): Payer: Medicare Other | Admitting: Pharmacist

## 2012-09-16 ENCOUNTER — Encounter: Payer: Self-pay | Admitting: Pharmacist

## 2012-09-16 VITALS — BP 147/75 | HR 73 | Ht 63.0 in | Wt 235.0 lb

## 2012-09-16 DIAGNOSIS — E1165 Type 2 diabetes mellitus with hyperglycemia: Secondary | ICD-10-CM

## 2012-09-16 NOTE — Assessment & Plan Note (Signed)
  Diabetes of 20+ yrs duration currently under fair control of blood glucose based on  . Lab Results  Component Value Date   HGBA1C 7.8 07/30/2012     ,home fasting CBG readings of 150-250 since getting over infection. Control is suboptimal due to inadequate Lantus dose. Denies hypoglycemic events.   Increased dose of basal insulin Lantus (insulin glargine) to 32 units today. Patient will continue to titrate 1 unit/day if fasting CBGs > 100mg /dl until fasting CBGs reach goal or next visit. Encouraged patient to check CBG one additional time each day for postprandial glucose assessment.   Discussed an exercise plan for patient [facility] MWF) and counseled her on other ways to burn calories (arm exercises). Patient states her ideal weight is 136 pounds. Weight loss goal for next visit is 5 pounds.   Counseled her on proper technique and timing of use for her albuterol inhaler.   Written patient instructions provided.  Follow up in  Pharmacist Clinic Visit in 1 month.   Total time in face to face counseling 35 minutes.  Patient seen with Tomi Bamberger PharmD, Pharmacy Resident and Beatrix Shipper, PharmD candidate.Marland Kitchen

## 2012-09-16 NOTE — Progress Notes (Signed)
  Subjective:    Patient ID: Katherine Christensen, female    DOB: 1941-04-10, 72 y.o.   MRN: [id number]  HPI Patient presents in good spirits for a diabetes follow-up. Just got over a recent infection which caused her blood glucose to reach the 300's, but her glucose levels are looking much better within the past week (150's - 200's). Currently taking 27 units of Lantus daily; transitioned off oral DM medications within the last month and uses Lantus only. Endorses walking a lot at work, but denies any outside physical activity. Patient plans knee surgery in the future once CBG is under better control.    Review of Systems     Objective:   Physical Exam        Assessment & Plan:   Diabetes of 20+ yrs duration currently under fair control of blood glucose based on  . Lab Results  Component Value Date   HGBA1C 7.8 07/30/2012     ,home fasting CBG readings of 150-250 since getting over infection. Control is suboptimal due to inadequate Lantus dose. Denies hypoglycemic events.   Increased dose of basal insulin Lantus (insulin glargine) to 32 units today. Patient will continue to titrate 1 unit/day if fasting CBGs > 100mg /dl until fasting CBGs reach goal or next visit. Encouraged patient to check CBG one additional time each day for postprandial glucose assessment.   Discussed an exercise plan for patient [facility] MWF) and counseled her on other ways to burn calories (arm exercises). Patient states her ideal weight is 136 pounds. Weight loss goal for next visit is 5 pounds.   Counseled her on proper technique and timing of use for her albuterol inhaler.   Written patient instructions provided.  Follow up in  Pharmacist Clinic Visit in 1 month.   Total time in face to face counseling 35 minutes.  Patient seen with Tomi Bamberger PharmD, Pharmacy Resident and Beatrix Shipper, PharmD candidate.Marland Kitchen

## 2012-09-16 NOTE — Progress Notes (Signed)
Patient ID: Katherine Christensen, female   DOB: 08/14/1940, 71 y.o.   MRN: [id number] Reviewed: Agree with Dr. Koval's documentation and management. 

## 2012-09-16 NOTE — Patient Instructions (Addendum)
It was great to see you today.  Great job using your insulin and checking your blood sugar daily.   Increase your insulin dose to 32 units today.  Continue to increase your insulin dose by one unit each day (just like you have been doing) until your morning blood sugar number is around 100. Once your morning number is around 100, stay at that insulin dose.  Please continue to check your blood sugar each morning when you wake up and check once more during the day, 2 hours after you eat either lunch or dinner. Bring your meter with you to your next appointment so we can look at your sugars.  Try to increase your activity when you are not at work by going to water aerobics or using canned foods for arm exercises while you watch TV.  Please come back and see Dr. Raymondo Band in 1  month.

## 2012-10-08 ENCOUNTER — Ambulatory Visit (INDEPENDENT_AMBULATORY_CARE_PROVIDER_SITE_OTHER): Payer: 59 | Admitting: Sports Medicine

## 2012-10-08 VITALS — BP 132/60 | Ht 63.0 in | Wt 237.0 lb

## 2012-10-08 DIAGNOSIS — M6530 Trigger finger, unspecified finger: Secondary | ICD-10-CM

## 2012-10-08 DIAGNOSIS — M1710 Unilateral primary osteoarthritis, unspecified knee: Secondary | ICD-10-CM

## 2012-10-08 NOTE — Assessment & Plan Note (Signed)
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 Patient will be following up with orthopedic surgeon in the future. Patient though would like to hold off on surgery until the end of 2014. Patient was given steroid injections today and told that we can repeated every 3-4 months if necessary. Encourage patient to lose weight which would be very beneficial for her now as well as with her comorbidities.

## 2012-10-08 NOTE — Progress Notes (Signed)
Chief complaint: Right hand pain and bilateral knee pain.  History of present illness: Patient is a very pleasant 72 year old female coming in with multiple complaints. Patient has bilateral knee pain. Patient has had this for quite some time. Patient was seen previously back in September when she did have group for steroid injections into her knees. Patient states that this did seem to help. Patient was referred to orthopedic surgeon for total knee arthroplasty for definitive treatment the patient did not have this done. Patient states that she is to work until she has her car paid off which should be done at the end of the year and then she would like to have this done. Patient is still complaining of the same pain with ambulation area and patient has not taken any medications on a recurrent basis. Patient states that it is not stopping her from most of her regular activities of daily living.  Regarding right hand patient has had a history of a trigger finger release of the middle finger previously. Patient states that her ring finger seems to be doing the same thing. It gets stuck in certain positions but it is not as bad as her middle finger. Patient has not had any injections and states actually over the course last week or so has started to become better. Patient is not doing any interventions at this time. Patient states that there has been some swelling within but denies any numbness. Patient denies any true injury to the area. Past medical history, social, surgical and family history all reviewed.     physical exam  Blood pressure 132/60, height 5\' 3"  (1.6 m), weight 237 lb (107.502 kg).  general: No apparent distress alert and oriented x3 Right hand exam: On inspection there is no gross deformity. Patient has full range of motion patient has a nodule within the tendon sheath of the ring finger just proximal to the metacarpal head. Neurovascularly intact distally.  Knee exam Right knee shows  range of motion from 0-120. She is tender to palpation along the medial joint line with pain but no popping with McMurray's. 1-2+ patellofemoral crepitus. 1+ boggy synovitis. Good joint stability. Neurovascular intact distally. Walking with an obvious limp. Patient's left knee is very similar to the right knee exam.  Consent obtained and verified.  Time-out conducted.  Noted no overlying erythema, induration, or other signs of local infection.  Skin prepped in a sterile fashion.  Topical analgesic spray: Ethyl chloride.  Joint: Right Knee and left knee Needle: 25g 11/2 inch  Completed without difficulty.  Meds: 1cc Depo Medrol and 4cc 0.5% Marcaine  Advised to call if fevers/chills, erythema, induration, drainage, or persistent bleeding.

## 2012-10-08 NOTE — Assessment & Plan Note (Signed)
Patient would like to do more conservative therapy. Patient was given the name of capscasin. Encourage patient to take 325 mg of Tylenol 3 times daily.  Gloves at night Will return in 4-6 weeks if not better for injection.

## 2012-10-21 ENCOUNTER — Ambulatory Visit (INDEPENDENT_AMBULATORY_CARE_PROVIDER_SITE_OTHER): Payer: 59 | Admitting: Pharmacist

## 2012-10-21 ENCOUNTER — Encounter: Payer: Self-pay | Admitting: Pharmacist

## 2012-10-21 VITALS — BP 154/75 | HR 82 | Ht 63.0 in | Wt 242.0 lb

## 2012-10-21 DIAGNOSIS — E1165 Type 2 diabetes mellitus with hyperglycemia: Secondary | ICD-10-CM

## 2012-10-21 MED ORDER — INSULIN GLARGINE 100 UNIT/ML ~~LOC~~ SOLN: 50.0000 [IU] | Freq: Every day | SUBCUTANEOUS | Status: DC

## 2012-10-21 MED ORDER — LIRAGLUTIDE 18 MG/3ML ~~LOC~~ SOLN: 1.2000 mg | Freq: Every day | SUBCUTANEOUS | Status: DC

## 2012-10-21 NOTE — Assessment & Plan Note (Signed)
Diabetes of 20 yrs duration currently under fair control of blood glucose based on  . Lab Results  Component Value Date   HGBA1C 7.8 07/30/2012     ,home fasting CBG readings of 110s-230s and random CBG readings of ~290. Control is suboptimal due to likely elevated PPG reading and no meal time coverage. Denies hypoglycemic events.  Able to verbalize appropriate hypoglycemia management plan. Increased dose of basal insulin Lantus (insulin glargine) to 50 units daily in the morning. Written patient instructions provided.  Follow up in  Pharmacist Clinic Visit 7-10 days.   Total time in face to face counseling 45 minutes.  Patient seen with Abran Duke, PharmD Resident and Juanita Craver, PharmD candidate.  Given likely elevated PPG readings (292 on pts glucometer in office today), we discussed several opportunities for meal time coverage including Victoza and meal time insulin. Pt was counseled on dosing and injection technique with Victoza, as well as adverse effects. Pt was instructed on increasing Lantus and checking more post-prandial CBGs. The patient will do her own research on Victoza and f/u with pharmacy clinic with any questions.   Encouraged patient on lifestyle modifications including portion control and starting water aerobics which she seems motivated to do.

## 2012-10-21 NOTE — Progress Notes (Signed)
Patient ID: Katherine Christensen, female   DOB: [birth year], 72 y.o.   MRN: [id number] Reviewed: Agree with Dr. Macky Lower documentation and management.

## 2012-10-21 NOTE — Patient Instructions (Addendum)
It was good to see you today!  Start taking Lantus 50 units daily in the morning.  Please start checking some blood sugars 2 hours after you eat.   We will send a prescription for Victoza to your pharmacy to see if it will be approved and how much it will cost.  Please read some more about Victoza to see if you have any questions.  Please follow-up in the pharmacy clinic in 7-10 days.

## 2012-10-21 NOTE — Progress Notes (Signed)
  Subjective:    Patient ID: Katherine Christensen, female    DOB: [birth year], 71 y.o.   MRN: [id number]  HPI  Patient presents to clinic today in good spirits. Recently had a steroid shot in the knee that seemed to help. Patient has gained ~ 5 lbs since last visit. Patient complains of some vivid dreams during lantus titration, although she also admits to some stress during that time as well. Also concerned about some burning with lantus injections.   Review of Systems     Objective:   Physical Exam        Assessment & Plan:   Diabetes of 20 yrs duration currently under fair control of blood glucose based on  . Lab Results  Component Value Date   HGBA1C 7.8 07/30/2012     ,home fasting CBG readings of 110s-230s and random CBG readings of ~290. Control is suboptimal due to likely elevated PPG reading and no meal time coverage. Denies hypoglycemic events.  Able to verbalize appropriate hypoglycemia management plan. Increased dose of basal insulin Lantus (insulin glargine) to 50 units daily in the morning. Written patient instructions provided.  Follow up in  Pharmacist Clinic Visit 7-10 days.   Total time in face to face counseling 45 minutes.  Patient seen with Abran Duke, PharmD Resident and Juanita Craver, PharmD candidate.  Given likely elevated PPG readings (292 on pts glucometer in office today), we discussed several opportunities for meal time coverage including Victoza and meal time insulin. Pt was counseled on dosing and injection technique with Victoza, as well as adverse effects. Pt was instructed on increasing Lantus and checking more post-prandial CBGs. The patient will do her own research on Victoza and f/u with pharmacy clinic with any questions.   Encouraged patient on lifestyle modifications including portion control and starting water aerobics which she seems motivated to do.  Marland Kitchen

## 2012-10-22 ENCOUNTER — Telehealth: Payer: Self-pay | Admitting: *Deleted

## 2012-10-22 NOTE — Telephone Encounter (Signed)
Received fax from [facility] requesting prior authorization of Victoza.  Prior authorization form from [facility] placed in MD's box for completion.  Gaylene Brooks, RN

## 2012-10-24 ENCOUNTER — Telehealth: Payer: Self-pay | Admitting: Family Medicine

## 2012-10-24 NOTE — Telephone Encounter (Signed)
Patient calling b/c has rash on her abdomen that occurred within an hour of giving herself the injection. It was characterized by small bumps on her abdomen and itching. This occurred one week ago as well and is similar to a reaction she had to taking Levemir in the past. She denies chest pain, shortness of breath, swelling of lips and tongue. She notes that the bumps have almost completely resolved now no longer itches, but wondering what she should do. I encouraged her to continue Lantus at current dose and she might has a slight allergic reaction to the insulin. Encouraged to come to the ED is she has difficulty breathing. I also told her that I would call back in the morning after talking to Dr. Raymondo Band to see if he has any other insight. She is agreeable with this plan.

## 2012-10-25 ENCOUNTER — Telehealth: Payer: Self-pay | Admitting: Family Medicine

## 2012-10-25 NOTE — Telephone Encounter (Signed)
Patient says that her sugar is 190 this AM. Has not yet taken AM insulin. Is working towards 50 units, taking 46 yesterday so I instructed her to take 62 today. Also, I instructed her to stop taking the insulin if there are any issues with swelling of her lips or tongue of difficulty breathing. She was in agreement.

## 2012-10-28 NOTE — Telephone Encounter (Signed)
Received approval for Victoza 3-pak 18mg /3 ml pen from [facility].  PA good through 10/28/2014.  Gaylene Brooks, RN

## 2012-10-29 ENCOUNTER — Encounter: Payer: Self-pay | Admitting: Pharmacist

## 2012-10-29 ENCOUNTER — Ambulatory Visit (INDEPENDENT_AMBULATORY_CARE_PROVIDER_SITE_OTHER): Payer: 59 | Admitting: Pharmacist

## 2012-10-29 VITALS — BP 164/58 | HR 87 | Wt 245.0 lb

## 2012-10-29 DIAGNOSIS — E1165 Type 2 diabetes mellitus with hyperglycemia: Secondary | ICD-10-CM

## 2012-10-29 MED ORDER — INSULIN PEN NEEDLE 31G X 8 MM MISC: 1.0000 | Freq: Every day | Status: DC

## 2012-10-29 MED ORDER — INSULIN GLARGINE 100 UNIT/ML SOLOSTAR PEN: 47.0000 [IU] | PEN_INJECTOR | Freq: Every day | SUBCUTANEOUS | Status: DC

## 2012-10-29 NOTE — Progress Notes (Signed)
Patient ID: Katherine Christensen, female   DOB: 01/16/1941, 71 y.o.   MRN: [id number] Reviewed: Agree with Dr. Koval's documentation and management. 

## 2012-10-29 NOTE — Patient Instructions (Addendum)
Enjoy working out at the [employer].  Decrease Lantus to 40 units once daily for the next week Take Victoza 0.6mg  once daily for the next week.   ON Saturday May 10th, Decrease Lantus to 35 units in the AM Increase Victoza to 1.2mg  once daily   Call OFFICE to report multiple blood glucose readings < 100 or symptomatic low blood sugar.  Fasting readings of 120-150 are acceptable for the next few weeks  Return to Office in 3 weeks to see Pharmacist clinic

## 2012-10-29 NOTE — Assessment & Plan Note (Signed)
Diabetes currently under fair and improved control of blood glucose based on   Lab Results  Component Value Date   HGBA1C 7.8 07/30/2012  and home fasting readings of 93-120 over the last 4-5 days.  Control is suboptimal due to patient continues to worry about weight gain with insulin. She acknowledges her willingness to eat all the food on her plate.  Denies hypoglycemic events.  Able to verbalize appropriate hypoglycemia management plan. Decreased dose of basal insulin Lantus (insulin glargine) to 40 units while we initiate the starting dose of Victoza 0.6mg  THEN decrease to 35 units the following week when we increase the Victoza to the higher dose of 1.2mg  daily. Goal CBGs for the nest 2-3 weeks will be fasting CBGs 120-150.   Written patient instructions provided.  Follow up in  Pharmacist Clinic Visit 3 weeks.   Total time in face to face counseling 50 minutes.

## 2012-10-29 NOTE — Progress Notes (Signed)
  Subjective:    Patient ID: Katherine Christensen, female    DOB: 04/22/1941, 72 y.o.   MRN: [id number]  HPI Patient arrives in usual good spirits.  She has reviewed information on Victoza and had several questions.  She is willing to initiate Victoza in combination with insulin after some discussion RE side effects and time on the market.   She is excited to start working out at the [employer] with her [employer].    Review of Systems     Objective:   Physical Exam  Currently taking 47 units of Lantus QAM      Assessment & Plan:  Diabetes currently under fair and improved control of blood glucose based on   Lab Results  Component Value Date   HGBA1C 7.8 07/30/2012  and home fasting readings of 93-120 over the last 4-5 days.  Control is suboptimal due to patient continues to worry about weight gain with insulin. She acknowledges her willingness to eat all the food on her plate.  Denies hypoglycemic events.  Able to verbalize appropriate hypoglycemia management plan. Decreased dose of basal insulin Lantus (insulin glargine) to 40 units while we initiate the starting dose of Victoza 0.6mg  THEN decrease to 35 units the following week when we increase the Victoza to the higher dose of 1.2mg  daily. Goal CBGs for the nest 2-3 weeks will be fasting CBGs 120-150.   Written patient instructions provided.  Follow up in  Pharmacist Clinic Visit 3 weeks.   Total time in face to face counseling 50 minutes.

## 2012-11-01 ENCOUNTER — Telehealth: Payer: Self-pay | Admitting: Family Medicine

## 2012-11-01 NOTE — Telephone Encounter (Signed)
Pt called to say that she did not start that Victoza because she is sick - having diarrhea/fever - will start after she feels better - if he wants her to do something differently, pls let her know

## 2012-11-04 ENCOUNTER — Encounter: Payer: Self-pay | Admitting: Sports Medicine

## 2012-11-04 ENCOUNTER — Ambulatory Visit (INDEPENDENT_AMBULATORY_CARE_PROVIDER_SITE_OTHER): Payer: 59 | Admitting: Sports Medicine

## 2012-11-04 VITALS — BP 141/76 | HR 91 | Ht 63.0 in | Wt 245.0 lb

## 2012-11-04 DIAGNOSIS — M79605 Pain in left leg: Secondary | ICD-10-CM

## 2012-11-04 DIAGNOSIS — IMO0002 Reserved for concepts with insufficient information to code with codable children: Secondary | ICD-10-CM

## 2012-11-04 DIAGNOSIS — M79609 Pain in unspecified limb: Secondary | ICD-10-CM

## 2012-11-04 DIAGNOSIS — M5416 Radiculopathy, lumbar region: Secondary | ICD-10-CM

## 2012-11-04 MED ORDER — METHYLPREDNISOLONE ACETATE 80 MG/ML IJ SUSP
80.0000 mg | Freq: Once | INTRAMUSCULAR | Status: AC
  Administered 2012-11-04: 80 mg via INTRAMUSCULAR

## 2012-11-04 NOTE — Progress Notes (Signed)
  Subjective:    Patient ID: Katherine Christensen, female    DOB: 1941/02/22, 72 y.o.   MRN: [id number]  HPI chief complaint: Left leg pain  Patient comes in today complaining of 2 weeks of left leg pain. She simply awoke with left-sided low back pain which then began to radiate down the left leg. She describes a burning sensation along the lateral thigh which extends past the knee into the medial lower leg and ankle. Some associated tingling. No numbness. No weakness. Symptoms are worse with sitting and improve some with standing. She denies prior occurrences. No groin pain. She has a history of bilateral knee DJD but her current pain is different in nature than what she's experienced previously with her knees.    Review of Systems     Objective:   Physical Exam Well-developed, obese, no acute distress  Lumbar spine: Pain with forward flexion, painless extension. Diffuse tenderness to palpation along the left lower spine. No tenderness along the lumbar midline. No spasm. Negative straight leg raise bilaterally. Negative log roll bilaterally. Reflexes are trace but equal at the Achilles and patellar tendons bilaterally. Strength is 5/5 both lower extremities. Walking with a slight limp.       Assessment & Plan:  1. Left leg pain likely secondary to lumbar radiculopathy  Patient is given 80 mg of Depo-Medrol IM. [facility] and can wean to a home exercise program per the therapist's discretion. She will followup for ongoing or recalcitrant issues.

## 2012-11-05 ENCOUNTER — Ambulatory Visit: Payer: Medicare PPO | Attending: Sports Medicine | Admitting: Physical Therapy

## 2012-11-05 DIAGNOSIS — M2550 Pain in unspecified joint: Secondary | ICD-10-CM | POA: Insufficient documentation

## 2012-11-05 DIAGNOSIS — M545 Low back pain, unspecified: Secondary | ICD-10-CM | POA: Insufficient documentation

## 2012-11-05 DIAGNOSIS — IMO0001 Reserved for inherently not codable concepts without codable children: Secondary | ICD-10-CM | POA: Insufficient documentation

## 2012-11-05 DIAGNOSIS — M79609 Pain in unspecified limb: Secondary | ICD-10-CM | POA: Insufficient documentation

## 2012-11-11 ENCOUNTER — Encounter: Payer: Self-pay | Admitting: Family Medicine

## 2012-11-11 ENCOUNTER — Ambulatory Visit (INDEPENDENT_AMBULATORY_CARE_PROVIDER_SITE_OTHER): Payer: 59 | Admitting: Family Medicine

## 2012-11-11 VITALS — BP 146/75 | HR 88 | Temp 98.7°F | Wt 248.0 lb

## 2012-11-11 DIAGNOSIS — L03319 Cellulitis of trunk, unspecified: Secondary | ICD-10-CM

## 2012-11-11 DIAGNOSIS — L02214 Cutaneous abscess of groin: Secondary | ICD-10-CM

## 2012-11-11 DIAGNOSIS — R42 Dizziness and giddiness: Secondary | ICD-10-CM

## 2012-11-11 DIAGNOSIS — M899 Disorder of bone, unspecified: Secondary | ICD-10-CM

## 2012-11-11 DIAGNOSIS — I10 Essential (primary) hypertension: Secondary | ICD-10-CM

## 2012-11-11 DIAGNOSIS — M8580 Other specified disorders of bone density and structure, unspecified site: Secondary | ICD-10-CM

## 2012-11-11 MED ORDER — LOSARTAN POTASSIUM 100 MG PO TABS: 50.0000 mg | ORAL_TABLET | Freq: Every day | ORAL | Status: DC

## 2012-11-11 NOTE — Patient Instructions (Addendum)
Dear Mrs. Skillin,   Thank you for coming to clinic today. Please read below regarding the issues that we discussed.   1. Light headedness - Please decrease Losartan to 50 mg daily and let me know how that goes.   2. Osteopenia - this means your bones are not as dense as they used to be. Please go to [facility] for this study. Also, we will check your vitamin D today.   3. Diabetes - It is ok to start Victoza.   Please follow up in clinic in 4 weeks. Please call earlier if you have any questions or concerns.   Sincerely,   Dr. Clinton Sawyer

## 2012-11-11 NOTE — Progress Notes (Signed)
  Subjective:    Patient ID: Katherine Christensen, female    DOB: May 01, 1941, 72 y.o.   MRN: [id number]  HPI  72 year old F with DM type 2 and osteopenia who presents for follow up.   Unsteadiness/lightheadedness - This started approximately 2 weeks ago, occurs approximately 1 hours after taking her BP medication (losartan), denies any LOC or falls, denies blurred vision, denies room spinning, patient concerned that it is realted to her ARB so she stopped staking ARB 2 days ago and symptoms have resolved; Of note patient has history of vertiginous symptoms that resolved months ago and these symptoms are not similar; Patient acknowledges that being on BP meds is good for her kidneys   Abscess - Right groin, tender, started draining spontaneously yesterday, denies fever/ chills, no hx of cellulitis  Osteopenia - Patient with history of osteopenia per bone density scan in 2007, was supposed to have follow up study 2 years later, but not done; Patient not aware of diagnosis of osteopenia, no hx of insuffiencey fracture or falls, no hx of taking calcium-vit D; risk factors for vitamin D insufficiency include race (African-American)   Diabetes - Not started Victoza b/c she is skeptical of another injection, still taking Lantus at same dose, no low CBG's, patient will discuss with Dr. Raymondo Band at appointment next week   Review of Systems     Objective:   Physical Exam  BP 146/75  Pulse 88  Temp(Src) 98.7 F (37.1 C)  Wt 248 lb (112.492 kg)  BMI 43.94 kg/m2 Gen: obese, well appearing, pleasant and conversant  HEENT: NCAT, PERRLA, EOMI, TM reflective without effusion  Right Groin: draining abscess of non purulent fluid, mildly tender, non erythematous no warmth  Neuro: no nystagmus, negative Dix Hallpike maneuver      Assessment & Plan:

## 2012-11-12 LAB — VITAMIN D 25 HYDROXY (VIT D DEFICIENCY, FRACTURES): Vit D, 25-Hydroxy: 12 ng/mL — ABNORMAL LOW (ref 30–89)

## 2012-11-16 ENCOUNTER — Encounter: Payer: Self-pay | Admitting: Family Medicine

## 2012-11-16 ENCOUNTER — Telehealth: Payer: Self-pay | Admitting: Family Medicine

## 2012-11-16 DIAGNOSIS — E559 Vitamin D deficiency, unspecified: Secondary | ICD-10-CM

## 2012-11-16 MED ORDER — VITAMIN D3 1.25 MG (50000 UT) PO CAPS: 50000.0000 [IU] | ORAL_CAPSULE | Freq: Every day | ORAL | Status: DC

## 2012-11-16 NOTE — Telephone Encounter (Signed)
I called the patient to let her know that her level of Vit D was extremely low and that we will supplement with 50,000 units daily for 8 weeks and then re-check. She is agreeable with this.

## 2012-11-16 NOTE — Telephone Encounter (Signed)
That is low. We need to supplement with Vitamin D for sure. This will likely start with high dose Vitamin D for 8 weeks and then back off to a lower daily dose for maintenance. I would like to get your bone scan to get the full picture of your bone health. I see that it is scheduled for 11/23/12. I will call you to discuss the details of the Vitamin D.

## 2012-11-17 ENCOUNTER — Telehealth: Payer: Self-pay | Admitting: Family Medicine

## 2012-11-17 ENCOUNTER — Ambulatory Visit: Payer: Medicare PPO | Admitting: Rehabilitation

## 2012-11-17 NOTE — Telephone Encounter (Signed)
Left message that patient should started 50,00 units of Vit D3 per week for 8 weeks and not daily as I incorrectly noted previously. I also noted that this was available over the counter.

## 2012-11-17 NOTE — Telephone Encounter (Signed)
You are correct. I was incorrect in telling you to take the medication daily. It should be weekly for 8 weeks. We can use 50,000 units of Vitamin D3 for 8 weeks and then recheck your vitamin D. I am sorry for the confusion and appreciate that help of the pharmacy in making sure we get it straight. Also, the Vitamin D3 is available over the counter.

## 2012-11-19 ENCOUNTER — Encounter: Payer: Self-pay | Admitting: Pharmacist

## 2012-11-19 ENCOUNTER — Ambulatory Visit (INDEPENDENT_AMBULATORY_CARE_PROVIDER_SITE_OTHER): Payer: 59 | Admitting: Pharmacist

## 2012-11-19 VITALS — BP 161/55 | HR 85 | Ht 62.99 in | Wt 248.7 lb

## 2012-11-19 DIAGNOSIS — E559 Vitamin D deficiency, unspecified: Secondary | ICD-10-CM

## 2012-11-19 DIAGNOSIS — E1165 Type 2 diabetes mellitus with hyperglycemia: Secondary | ICD-10-CM

## 2012-11-19 NOTE — Assessment & Plan Note (Signed)
Diabetes of many yrs duration currently under good (improved) control  of blood glucose based on  . Lab Results  Component Value Date   HGBA1C 7.8 07/30/2012     ,home fasting CBG readings of 90's and random CBG readings of <200's. Control is suboptimal due to lack of exercise, but pt is committed to going to the [facility] regularly. Denies hypoglycemic events.  Able to verbalize appropriate hypoglycemia management plan. Decreased dose of basal insulin Lantus (insulin glargine) to 44 units daily and began Victoza 0.6mg s for this week. Next week - decrease Lantus to 35 units daily & increase Victoza 1.2mg  daily. Counseled and reviewed Lantus & Victoza injection technique with pt. Written patient instructions provided. Follow up with Dr Raymondo Band in 5-6 weeks (July). Total time in face to face counseling 30 minutes.  Patient seen with Richrd Humbles, PharmD candidate.

## 2012-11-19 NOTE — Addendum Note (Signed)
Addended by: Kathrin Ruddy on: 11/19/2012 11:18 AM   Modules accepted: Orders

## 2012-11-19 NOTE — Patient Instructions (Addendum)
Thank you for coming in today, it was great to see you again Katherine Christensen! Continue working on your exercise!   This week: Please decrease Lantus to 44 units daily Please start taking Victoza 0.6mg s daily around a meal  Next week: Decrease Lantus to 35 units daily Victoza 1.2mg s daily  Please start taking Vitamin D - Take 1 capsule every WEEK.  Follow up with Dr Raymondo Band in 5-6 weeks (July). We will check A1C at that visit. Please call if you are having low sugars.

## 2012-11-19 NOTE — Progress Notes (Signed)
  Subjective:    Patient ID: Katherine Christensen, female    DOB: January 06, 1941, 72 y.o.   MRN: [id number]  HPI Pt arrives in clinic walking in good spirits. Forgot meter at home, endorses CBG <200, some 90s. Has not started Victoza or Vitamin D. Pt had been planning on exercising at the [facility], but the healing of a boil on her leg prevented her. 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 Lab Results  Component Value Date   HGBA1C 7.8 07/30/2012     ,home fasting CBG readings of 90's and random CBG readings of <200's. Control is suboptimal due to lack of exercise, but pt is committed to going to the [facility] regularly. Denies hypoglycemic events.  Able to verbalize appropriate hypoglycemia management plan. Decreased dose of basal insulin Lantus (insulin glargine) to 44 units daily and began Victoza 0.6mg s for this week. Next week - decrease Lantus to 35 units daily & increase Victoza 1.2mg  daily. Counseled and reviewed Lantus & Victoza injection technique with pt. Written patient instructions provided.   Also counseled pt on starting Vitamin D 50,000 IU once weekly. Follow up with Dr Raymondo Band in 5-6 weeks (July). Total time in face to face counseling 30 minutes.  Patient seen with Richrd Humbles, PharmD candidate.

## 2012-11-20 DIAGNOSIS — L02214 Cutaneous abscess of groin: Secondary | ICD-10-CM | POA: Insufficient documentation

## 2012-11-20 DIAGNOSIS — R42 Dizziness and giddiness: Secondary | ICD-10-CM | POA: Insufficient documentation

## 2012-11-20 NOTE — Assessment & Plan Note (Signed)
Repeat bone density scan. Will also check Vit D and start supplementation based on quantity.

## 2012-11-20 NOTE — Assessment & Plan Note (Signed)
Likely etiology is BP meds, but unlikely hypotensive as patient without history of hypotension. Possible orthostatic hypotension, but does not occurs when going from seating to standing. Check orthostatic BP at next visit. Patient has always been very sensitive to medication and quick to stop taking them, so I educated her on importance of continuing losartan if possible. We will reduce dose to 50 mg from 100 mg and follow up.

## 2012-11-20 NOTE — Assessment & Plan Note (Signed)
Draining spontaneously without evidence of surrounding cellulitis, so no intervention needed at this point. Encouraged warm compresses to aid drainage. Will stay out of pool until completely healed.

## 2012-11-23 ENCOUNTER — Ambulatory Visit: Payer: Medicare PPO | Admitting: Rehabilitation

## 2012-11-23 ENCOUNTER — Ambulatory Visit
Admission: RE | Admit: 2012-11-23 | Discharge: 2012-11-23 | Disposition: A | Discharge status: Home / Self Care | Payer: 59 | Source: Ambulatory Visit | Attending: Family Medicine | Admitting: Family Medicine

## 2012-11-23 ENCOUNTER — Encounter: Payer: Self-pay | Admitting: Family Medicine

## 2012-11-23 DIAGNOSIS — M8580 Other specified disorders of bone density and structure, unspecified site: Secondary | ICD-10-CM

## 2012-11-23 NOTE — Progress Notes (Signed)
Patient ID: Katherine Christensen, female   DOB: 06/24/1941, 71 y.o.   MRN: [id number] Reviewed: Agree with Dr. Koval's documentation and management. 

## 2012-11-25 ENCOUNTER — Ambulatory Visit: Payer: Medicare PPO | Admitting: Rehabilitation

## 2012-11-30 ENCOUNTER — Ambulatory Visit: Payer: Medicare PPO | Attending: Sports Medicine | Admitting: Physical Therapy

## 2012-11-30 DIAGNOSIS — M545 Low back pain, unspecified: Secondary | ICD-10-CM | POA: Insufficient documentation

## 2012-11-30 DIAGNOSIS — IMO0001 Reserved for inherently not codable concepts without codable children: Secondary | ICD-10-CM | POA: Insufficient documentation

## 2012-11-30 DIAGNOSIS — M2550 Pain in unspecified joint: Secondary | ICD-10-CM | POA: Insufficient documentation

## 2012-11-30 DIAGNOSIS — M79609 Pain in unspecified limb: Secondary | ICD-10-CM | POA: Insufficient documentation

## 2012-12-02 ENCOUNTER — Ambulatory Visit: Payer: Medicare PPO | Admitting: Physical Therapy

## 2012-12-06 ENCOUNTER — Ambulatory Visit: Payer: Medicare PPO | Admitting: Rehabilitative and Restorative Service Providers"

## 2012-12-07 ENCOUNTER — Ambulatory Visit: Payer: Medicare PPO | Admitting: Physical Therapy

## 2012-12-09 ENCOUNTER — Encounter: Payer: 59 | Admitting: Rehabilitative and Restorative Service Providers"

## 2012-12-09 ENCOUNTER — Ambulatory Visit: Payer: Medicare PPO | Admitting: Rehabilitation

## 2012-12-13 ENCOUNTER — Ambulatory Visit: Payer: Medicare PPO | Admitting: Physical Therapy

## 2012-12-15 ENCOUNTER — Ambulatory Visit: Payer: Medicare PPO | Admitting: Physical Therapy

## 2012-12-20 ENCOUNTER — Ambulatory Visit: Payer: Medicare PPO | Admitting: Physical Therapy

## 2012-12-22 ENCOUNTER — Ambulatory Visit: Payer: Medicare PPO | Admitting: Physical Therapy

## 2012-12-24 ENCOUNTER — Encounter: Payer: 59 | Admitting: Rehabilitation

## 2012-12-27 ENCOUNTER — Ambulatory Visit: Payer: Medicare PPO | Admitting: Physical Therapy

## 2012-12-29 ENCOUNTER — Ambulatory Visit (INDEPENDENT_AMBULATORY_CARE_PROVIDER_SITE_OTHER): Payer: 59 | Admitting: Sports Medicine

## 2012-12-29 ENCOUNTER — Encounter: Payer: Self-pay | Admitting: Sports Medicine

## 2012-12-29 VITALS — BP 151/81 | HR 76 | Ht 63.0 in | Wt 248.0 lb

## 2012-12-29 DIAGNOSIS — M1710 Unilateral primary osteoarthritis, unspecified knee: Secondary | ICD-10-CM

## 2012-12-29 DIAGNOSIS — M1711 Unilateral primary osteoarthritis, right knee: Secondary | ICD-10-CM

## 2012-12-29 NOTE — Progress Notes (Signed)
  Subjective:    Patient ID: Katherine Christensen, female    DOB: [birth year], 72 y.o.   MRN: [id number]  HPI Patient comes in today with returning right knee pain. 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 She met with Dr. Dion Saucier last year and they discussed total knee arthroplasty. At that time her symptoms were not severe enough to warrant surgery but she is now ready to reconsider. No recent trauma. Pain is beginning to affect her quality of life. She is having trouble sleeping at night. She states that her interim medical history is unchanged.    Review of Systems     Objective:   Physical Exam Obese, no acute distress. Awake alert and oriented x3. Vital signs are reviewed.  Patient has about a 5 extension lag in the right knee. Flexion to 110. 1+ boggy synovitis. No effusion. Diffuse tenderness along both medial and lateral joint lines. Pain but no popping with McMurray's. Good ligamentous stability. Neurovascularly intact distally. Walking with an obvious limp.      Assessment & Plan:  1. Right knee pain secondary to end-stage DJD  Patient is referred back to Dr. Dion Saucier. She last saw him almost a year ago. Follow up with me prn.

## 2012-12-30 ENCOUNTER — Ambulatory Visit: Payer: Medicare PPO | Attending: Sports Medicine | Admitting: Physical Therapy

## 2012-12-30 DIAGNOSIS — M79609 Pain in unspecified limb: Secondary | ICD-10-CM | POA: Insufficient documentation

## 2012-12-30 DIAGNOSIS — M545 Low back pain, unspecified: Secondary | ICD-10-CM | POA: Insufficient documentation

## 2012-12-30 DIAGNOSIS — M2550 Pain in unspecified joint: Secondary | ICD-10-CM | POA: Insufficient documentation

## 2012-12-30 DIAGNOSIS — IMO0001 Reserved for inherently not codable concepts without codable children: Secondary | ICD-10-CM | POA: Insufficient documentation

## 2013-01-05 ENCOUNTER — Encounter: Payer: Self-pay | Admitting: Home Health Services

## 2013-01-05 ENCOUNTER — Ambulatory Visit (INDEPENDENT_AMBULATORY_CARE_PROVIDER_SITE_OTHER): Payer: Medicare HMO | Admitting: Home Health Services

## 2013-01-05 VITALS — BP 146/79 | HR 79 | Temp 99.0°F | Ht 63.0 in | Wt 253.0 lb

## 2013-01-05 DIAGNOSIS — Z0000 Encounter for general adult medical examination without abnormal findings: Secondary | ICD-10-CM

## 2013-01-05 DIAGNOSIS — E1165 Type 2 diabetes mellitus with hyperglycemia: Secondary | ICD-10-CM

## 2013-01-05 NOTE — Progress Notes (Signed)
I have reviewed this visit and discussed with Suzanne Lineberry and agree with her documentation  

## 2013-01-05 NOTE — Progress Notes (Signed)
Patient here for annual wellness visit, patient reports: Risk Factors/Conditions needing evaluation or treatment: Pt does not have any new risk factors to evaluate.  Pt reports feeling like something is not right but doesn't know what it is.  Pt reports sleeping better, no changes in energy level, no pain other than in right knee and shoulder, no changes in appetite or diet.  Home Safety: Pt lives by self in 1 story home.  Has part custody of a foster child, Miracle.  Other Information: Corrective lens: Pt wears daily corrective lens.  Has annual eye exams. Dentures: Pt has full top and partial lower dentures. Memory: Pt denies any memory problems. Patient's Mini Mental Score (recorded in doc. flowsheet): 29 Pt reports not falls in the past year.  Uses a can for support some times.  Had pain in right knee and is currently seeing specialist for this. Pt has a limp and is unable to do tandem stand.  Pt reports not problems with hearing.   Pt does not like taking insulin and would like to discuss other options for dm control.      Annual Wellness Visit Requirements Recorded Today In  Medical, family, social history Past Medical, Family, Social History Section  Current providers Care team  Current medications Medications  Wt, BP, Ht, BMI Vital signs  Tobacco, alcohol, illicit drug use History  ADL Nurse Assessment  Depression Screening Nurse Assessment  Cognitive impairment Nurse Assessment  Mini Mental Status Document Flowsheet  Fall Risk Fall/Depression  Home Safety Progress Note  End of Life Planning (welcome visit) Social Documentation  Medicare preventative services Progress Note  Risk factors/conditions needing evaluation/treatment Progress Note  Personalized health advice Patient Instructions, goals, letter  Diet & Exercise Social Documentation  Emergency Contact Social Documentation  Seat Belts Social Documentation  Sun exposure/protection Social Documentation

## 2013-01-06 ENCOUNTER — Other Ambulatory Visit: Payer: Self-pay

## 2013-01-11 ENCOUNTER — Encounter: Payer: 59 | Admitting: Physical Therapy

## 2013-01-13 ENCOUNTER — Encounter: Payer: 59 | Admitting: Physical Therapy

## 2013-01-13 ENCOUNTER — Encounter: Payer: Self-pay | Admitting: Family Medicine

## 2013-01-13 ENCOUNTER — Ambulatory Visit (INDEPENDENT_AMBULATORY_CARE_PROVIDER_SITE_OTHER): Payer: Medicare HMO | Admitting: Family Medicine

## 2013-01-13 DIAGNOSIS — R5383 Other fatigue: Secondary | ICD-10-CM

## 2013-01-13 DIAGNOSIS — Z01818 Encounter for other preprocedural examination: Secondary | ICD-10-CM

## 2013-01-13 NOTE — Progress Notes (Signed)
Subjective:    Patient ID: Katherine Christensen, female    DOB: 1940-10-03, 72 y.o.   MRN: [id number]  HPI  72 year old F with degenerative joint disease, obesity, HTN, HLD, and poorly controlled DM who presents for pre-operative cardiac evaluation in preparation of a knee replacement surgery. The patient has degenerative joint disease with severe right knee pain. Dr. Teryl Lucy is her orthopedic surgeon.   The patient denies any known heart disease. She states that was told that she had a cardiac arrythmia greater than 20 years ago. At the time, she was prescribed diltiazem and digoxin. However, she was taken off the medications shortly thereafter and has not had any problems with her heart rhythm since that time. She does not get chest pain with activity. She is able to participate in water aerobic and clean her house without symptoms.    Patient Active Problem List   Diagnosis Date Noted  . Diabetes mellitus type 2, uncontrolled 09/01/2012    Priority: High  . Vertigo 02/28/2012    Priority: Medium  . Knee osteoarthritis 06/20/2011    Priority: Medium  . Borderline glaucoma with ocular hypertension 10/03/2007    Priority: Medium  . HYPERLIPIDEMIA 04/22/2006    Priority: Medium  . OBESITY 04/22/2006    Priority: Medium  . SLEEP APNEA, OBSTRUCTIVE 04/22/2006    Priority: Medium  . HYPERTENSION 04/22/2006    Priority: Medium  . Abdominal pain 08/07/2011    Priority: Low  . Gout, unspecified 08/24/2007    Priority: Low  . PRURITUS 03/29/2007    Priority: Low  . HEMORRHOIDS 07/21/2006    Priority: Low  . SYSTOLIC MURMUR 04/22/2006    Priority: Low  . Trigger finger, acquired 10/08/2012   Past Medical History  Diagnosis Date  . Arrhythmia   . Mild anemia   . Osteoarthritis   . Female pelvic pain   . Mole (skin)   . Borderline glaucoma   . GERD (gastroesophageal reflux disease)   . Type 2 diabetes mellitus   . Hypertension   . Obesity   . Hyperlipidemia   . Sleep apnea    . MRSA (methicillin resistant staph aureus) culture positive   . Systolic murmur   . Hemorrhoids   . Yeast vaginitis   . 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 Qualifier: Diagnosis of  By: Shawnie Pons MD, Kenney Houseman      Past Surgical History  Procedure Laterality Date  . Vaginal hysterectomy  1982    For fibroids  . Knee arthroscopy    . Laparoscopy    . Dilation and curettage of uterus    . Breast biopsy    . Oophorectomy     Current Outpatient Prescriptions on File Prior to Visit  Medication Sig Dispense Refill  . acetaminophen (TYLENOL) 500 MG tablet Take 500 mg by mouth 3 (three) times daily.      Marland Kitchen albuterol (PROVENTIL HFA;VENTOLIN HFA) 108 (90 BASE) MCG/ACT inhaler Inhale 1-2 puffs into the lungs every 6 (six) hours as needed for wheezing.  1 Inhaler  0  . Cholecalciferol (VITAMIN D3) 50000 UNITS CAPS Take 50,000 Units by mouth once a week.  60 capsule  0  . glucose blood (ACCU-CHEK AVIVA PLUS) test strip Use as instructed  100 each  10  . GNP LANCETS MICRO THIN 33G MISC TEST BLOOD GLUCOSE TWICE DAILY  100 each  3  . ibuprofen (ADVIL,MOTRIN) 200 MG tablet Take 200 mg by mouth as needed  for pain (for headaches).      . Insulin Glargine (LANTUS SOLOSTAR) 100 UNIT/ML SOPN Inject 47 Units into the skin daily.  5 pen  PRN  . Insulin Pen Needle (B-D ULTRAFINE III SHORT PEN) 31G X 8 MM MISC 1 Container by Does not apply route daily. Dispense amount sufficient for two shots daily (1 lantus and 1 victoza)  1 each  PRN  . Liraglutide (VICTOZA) 18 MG/3ML SOLN injection Inject 0.2 mLs (1.2 mg total) into the skin daily.  36 mg  4  . losartan (COZAAR) 100 MG tablet Take 0.5 tablets (50 mg total) by mouth daily.  30 tablet  11   No current facility-administered medications on file prior to visit.     Review of Systems Positive for increased left knee pain, social stress, headache, intermittent right breast swelling, and chronic right lower abdominal pain     Objective:    Physical Exam  BP 142/86  Pulse 82  Ht 5\' 3"  (1.6 m)  Wt 256 lb (116.121 kg)  BMI 45.36 kg/m2 Gen: 72 year old AAF, mild emotional distress compared to baseline,  CV: RRR, no murmurs, no carotid bruits, 2+ peripheral pulses Pulm: lungs clear to auscultation bilaterally Abd: soft, non tender, non distended, no abdominal bruits     Assessment & Plan:   The patient does not have any active cardiac problems and has a functional capacity greater than 4 METS. Therefore, she is cleared from a cardiac standpoint for surgery in accordance with American College of Cardiology Preoperative Risk Assessment. I will obtain a baseline EKG and repeat chest X ray.   From a medical standpoint, my greatest concern is her uncontrolled diabetes (last A1C 9.5 on 01/05/13). She is aware the high blood sugars will delay wound healing and is working to improve compliance with her medication in preparation for the surgery. This, however, is not a contraindication to surgery. On the day of surgery, the patient should hold her Liraglutide, but she should continue with Insulin Glargine at half of her daily dose. She is medically cleared for surgery. I will check screening CBC, CMP, Lipid Profile and TSH prior to surgery and amend any recommmendations as needed based upon those results.

## 2013-01-13 NOTE — Patient Instructions (Signed)
Katherine Christensen,   Thank you for coming in today. I am sorry about all the stress that you are under, but we will help you get to feeling better. Regarding, the surgical issue, you should be just fine. I will send a letter to Dr. Dion Saucier once I have the results from the X-ray and EKG back.   Please follow up with me in 2 weeks to discuss your sugar.    Sincerely,   Dr. Clinton Sawyer

## 2013-01-14 ENCOUNTER — Encounter: Payer: Self-pay | Admitting: Family Medicine

## 2013-01-14 ENCOUNTER — Other Ambulatory Visit: Payer: Medicare HMO

## 2013-01-14 DIAGNOSIS — R5383 Other fatigue: Secondary | ICD-10-CM

## 2013-01-14 DIAGNOSIS — Z01818 Encounter for other preprocedural examination: Secondary | ICD-10-CM

## 2013-01-14 LAB — CBC
HCT: 38.4 % (ref 36.0–46.0)
Hemoglobin: 12.8 g/dL (ref 12.0–15.0)
MCH: 28.4 pg (ref 26.0–34.0)
MCHC: 33.3 g/dL (ref 30.0–36.0)

## 2013-01-14 LAB — COMPREHENSIVE METABOLIC PANEL
AST: 10 U/L (ref 0–37)
Alkaline Phosphatase: 80 U/L (ref 39–117)
BUN: 8 mg/dL (ref 6–23)
Creat: 0.53 mg/dL (ref 0.50–1.10)

## 2013-01-14 LAB — LIPID PANEL
Cholesterol: 203 mg/dL — ABNORMAL HIGH (ref 0–200)
Total CHOL/HDL Ratio: 3.4 Ratio
Triglycerides: 88 mg/dL (ref <150)
VLDL: 18 mg/dL (ref 0–40)

## 2013-01-14 NOTE — Progress Notes (Signed)
CMP,CBC,FLP AND TSH DONE TODAY Katherine Christensen 

## 2013-01-15 ENCOUNTER — Encounter: Payer: Self-pay | Admitting: Family Medicine

## 2013-01-19 ENCOUNTER — Ambulatory Visit (INDEPENDENT_AMBULATORY_CARE_PROVIDER_SITE_OTHER): Payer: Medicare HMO | Admitting: Family Medicine

## 2013-01-19 ENCOUNTER — Encounter: Payer: Self-pay | Admitting: Family Medicine

## 2013-01-19 ENCOUNTER — Ambulatory Visit (HOSPITAL_COMMUNITY)
Admission: RE | Admit: 2013-01-19 | Discharge: 2013-01-19 | Disposition: A | Discharge status: Home / Self Care | Payer: Medicare HMO | Source: Ambulatory Visit | Attending: Family Medicine | Admitting: Family Medicine

## 2013-01-19 VITALS — BP 160/83 | HR 76 | Ht 63.0 in | Wt 256.0 lb

## 2013-01-19 DIAGNOSIS — I252 Old myocardial infarction: Secondary | ICD-10-CM

## 2013-01-19 DIAGNOSIS — Z0000 Encounter for general adult medical examination without abnormal findings: Secondary | ICD-10-CM | POA: Insufficient documentation

## 2013-01-19 DIAGNOSIS — Z01810 Encounter for preprocedural cardiovascular examination: Secondary | ICD-10-CM | POA: Insufficient documentation

## 2013-01-19 DIAGNOSIS — E785 Hyperlipidemia, unspecified: Secondary | ICD-10-CM

## 2013-01-19 MED ORDER — ATORVASTATIN CALCIUM 40 MG PO TABS: 40.0000 mg | ORAL_TABLET | Freq: Every day | ORAL | Status: DC

## 2013-01-19 NOTE — Assessment & Plan Note (Signed)
Patient only taking cozaar 50 mg. SBP elevated today, but patient acutely stressed. Recheck in 2 weeks.

## 2013-01-19 NOTE — Progress Notes (Signed)
  Subjective:    Patient ID: Katherine Christensen, female    DOB: 02/19/1941, 72 y.o.   MRN: [id number]  HPI  72 year old F who presents today for follow up to recent lab work showing hyperlipidemia and to have a pre-operative ECG which could not be completed at the previous visit.    History of MI - Patient presented last week for non cardiac operation pre-surgical evaluation and at the time an ECG could not be performed. Therefore, it was performed today and demonstrates q waves in inferior leads. She had previously denied a known history of coronary artery disease or MI. Today, she notes that in the mid-1990's another physician looked at an EKG and told her that she had a previous heart attack. At the time, the doctor sent her for an echocardiogram which she reports was normal. She does not remember this physician's name, but states it was in [place], [place]. She also had a completely normal echo in 2007, which is in our records.  She does not know exactly when the MI occurred, but does remember a time in the early 1990's when she had central chest pain and neck pain for a few days which she attributed to carrying heavy groceries. Katherine Christensen took aspirin, and it resolved within a few days. No medical care was sought at the time and no similar incidents have occurred since. She still denies any chest pain with activity.    Hyperlipidemia and cardiac disease risk - Patient not on a statin since Sping 2013. The patient has always been skeptical of this medication and never taken consistently. Recently had cholesterol panel noted below, that correlated to 10 years CVD risk > 30%. This was prior to obtaining the history that the patient has in fact had an MI. She has no true history of statin intolerance of liver disease. She is agreeable to this and would like to start a statin.   Past Medical History - MI (updated)  Review of Systems  Respiratory: Negative for chest tightness and shortness of breath.    Cardiovascular: Negative for chest pain and palpitations.       Objective:   Physical Exam BP 160/83  Pulse 76  Ht 5\' 3"  (1.6 m)  Wt 256 lb (116.121 kg)  BMI 45.36 kg/m2  Gen: obese AAF, non distressed, non ill appearing CV: RRR, no murmurs Pulm: CTA-B  Extremities: 2+ peripheral pulses  Lipid Panel     Component Value Date/Time   CHOL 203* 01/14/2013 0844   TRIG 88 01/14/2013 0844   HDL 60 01/14/2013 0844   CHOLHDL 3.4 01/14/2013 0844   VLDL 18 01/14/2013 0844   LDLCALC 125* 01/14/2013 0844     Date: 01/19/13  Rate: 72  Rhythm: normal sinus rhythm  QRS Axis: normal  Intervals: normal  ST/T Wave abnormalities: nonspecific ST changes  Conduction Disutrbances:none  Narrative Interpretation: Q waves in II,III, AVF give indication of prior inferior MI  Old EKG Reviewed: none available        Assessment & Plan:  > 35 minutes spent in direct patient interaction and coordination of care

## 2013-01-19 NOTE — Assessment & Plan Note (Signed)
Despite the evidence of prior infarct on ECG, the patient still have cardiac clearance for knee replacement since she does not have active cardiac condition, and she can achieve greater than or equal to 4 METS. Continue lipitor in peri-operative setting.

## 2013-01-19 NOTE — Assessment & Plan Note (Addendum)
Patient has history of MI and poorly controlled diabetes, so patient counseled on need to start statin and for the first time take the medication regularly. She is agreeable and expresses need to take it for prevention of MI. Start lipitor 40 mg.

## 2013-01-19 NOTE — Assessment & Plan Note (Signed)
Starting patient on statin. Need to work to control diabetes and HTN. Also, consider starting beta blocker in the future.

## 2013-01-19 NOTE — Patient Instructions (Addendum)
Dear Ms. Ruberg,   Thanks for coming today. It was great to see you as always. Please read below:  1. Cholesterol - As I stated your cardiovascular disease risk over the next 10 years is > 30%, and we are going to reduce that by starting Lipitor 40 mg daily. I want you to be on it and know that it can be tolerated prior to surgery.   2. Diabetes - Continue taking the Lantus and follow up in 2 weeks to discuss.   3. Blood Pressure - I realize that you are stressed today. We will follow that up at your next visit.   Take Care,   Dr. Clinton Sawyer

## 2013-01-28 ENCOUNTER — Encounter: Payer: Self-pay | Admitting: Family Medicine

## 2013-02-03 ENCOUNTER — Telehealth: Payer: Self-pay | Admitting: Family Medicine

## 2013-02-03 NOTE — Telephone Encounter (Signed)
Patient is calling because her feet are swollen, one foot more than the other.  It is just her feet, not her toes.  She isn't scheduled to come in until the end of the month and doesn't want to have to if there is something she can do to bring down the swelling at home.  So, she would like to speak to the triage nurse.

## 2013-02-03 NOTE — Telephone Encounter (Signed)
Returned call to pt - states that she has been elevating right leg and not left - left is swollen - no other symptoms - suggested elevating both - no other problems - Wyatt Haste, RN-BSN

## 2013-02-16 ENCOUNTER — Telehealth: Payer: Self-pay | Admitting: Family Medicine

## 2013-02-16 NOTE — Telephone Encounter (Signed)
Pt needs her fluid pill refilled.  Her sister died today and she has to go on the train to [place]. She leaves at 3;44 tomorrow morning. She would call her pharmacy for a refill but she doesn't have the bottle. Please advise

## 2013-02-16 NOTE — Telephone Encounter (Signed)
Advised pt we do not have a fluid pill on her current med list-states it's been a while. Pt states she will sched OV to discuss when she gets back from her sisters funeral.

## 2013-02-24 ENCOUNTER — Ambulatory Visit: Payer: Medicare HMO | Admitting: Family Medicine

## 2013-03-09 ENCOUNTER — Ambulatory Visit (INDEPENDENT_AMBULATORY_CARE_PROVIDER_SITE_OTHER): Payer: Medicare HMO | Admitting: Family Medicine

## 2013-03-09 ENCOUNTER — Encounter: Payer: Self-pay | Admitting: Family Medicine

## 2013-03-09 VITALS — BP 166/84 | HR 88 | Ht 62.0 in | Wt 259.0 lb

## 2013-03-09 DIAGNOSIS — Z719 Counseling, unspecified: Secondary | ICD-10-CM

## 2013-03-09 DIAGNOSIS — Z7189 Other specified counseling: Secondary | ICD-10-CM

## 2013-03-09 NOTE — Patient Instructions (Addendum)
Dear Ms. Kaja,   Thanks for coming in today. I am sorry that you have been going through so much stress with your family. Please read below regarding the problems:  1. Cholesterol - Please start taking the Lipitor  2. Blood Pressure - Continue the cozaar at 50 mg  3. Diabetes - Please follow up in 4 weeks for this   4. Surgery - There is no reason to do the surgery right now since your are not comfortable with it.   Please follow up in 4 weeks.   Sincerely,   Dr. Clinton Sawyer

## 2013-03-13 NOTE — Progress Notes (Signed)
  Subjective:    Patient ID: Katherine Christensen, female    DOB: 20-Dec-1940, 72 y.o.   MRN: [id number]  HPI  72 year old F with CAD, morbid obesity, severe degenerative joint disease of right knee, poorly controlled DM type 2 and poorly controlled HTN who present for general health counseling. She is concerned today that she is not in good enough shape to undergo right knee replacement due to her following medical conditions: CAD, DM, HTN. Patient was recently in [place], [place] due to the death of her sister from cancer. She notes that she is not taking lipitor, not taking victoza and occasionally misses doses of her other medications. However, she desires to optimize her health prior to going through an elective procedure.    Review of Systems     Objective:   Physical Exam  Exam deferred as patient counseled only       Assessment & Plan:  I spoke for > 15 minutes with the patient about her need to optimize her health regardless of whether she chooses to go through the knee replacement. For starters, she needs take her lipitor to reduce the risk of another MI, needs to consistently take her blood pressure medication and to improve her commitment to controlling her diabetes. I am in agreement her plan to defer surgery. She will follow up in 4 weeks to discuss her diabetes management.

## 2013-03-15 ENCOUNTER — Ambulatory Visit (INDEPENDENT_AMBULATORY_CARE_PROVIDER_SITE_OTHER): Payer: Medicare HMO | Admitting: *Deleted

## 2013-03-15 DIAGNOSIS — Z23 Encounter for immunization: Secondary | ICD-10-CM

## 2013-03-28 ENCOUNTER — Encounter: Payer: Self-pay | Admitting: Family Medicine

## 2013-03-28 ENCOUNTER — Ambulatory Visit (INDEPENDENT_AMBULATORY_CARE_PROVIDER_SITE_OTHER): Payer: Medicare HMO | Admitting: Family Medicine

## 2013-03-28 VITALS — BP 170/72 | HR 80 | Temp 98.8°F | Ht 62.0 in | Wt 258.0 lb

## 2013-03-28 DIAGNOSIS — R109 Unspecified abdominal pain: Secondary | ICD-10-CM

## 2013-03-28 NOTE — Assessment & Plan Note (Signed)
Probably related to scar tissue.  No other pelvic organs noted.  Plus LOA was extensive when she underwent her last surgery.  Reassured, would likely reform if did more surgery at this point.

## 2013-03-28 NOTE — Progress Notes (Signed)
  Subjective:    Patient ID: Katherine Christensen, female    DOB: 1941/02/13, 72 y.o.   MRN: [id number]  HPI  RLQ pain present since before last surgery.  Op note, path reviewed and both tubes and ovaries present.  Also, path consistent with fibroadenoma.  Also concerned that one breast is larger than the other.  Had normal mammogram 08-2012.  Feels no masses or other issues.  Had EKG which showed previous MI.  Wants to know if we did anything about this during last surgery, or if there was a complication related to this.  Advised of nothing being noted, nor do I recall anything.  She is for elective TKR, but she has had a lot of death surrounding her lately and she is going to pursue postponement of surgery at this time.  Has started Lantus and it is going well--had terrivle diarrhea with Metformin, which has now resolved..  Will f/u with PCP soon.  Review of Systems  Constitutional: Negative for fever and chills.  Respiratory: Negative for chest tightness and shortness of breath.   Cardiovascular: Negative for chest pain and leg swelling.  Gastrointestinal: Positive for abdominal pain. Negative for nausea, vomiting and diarrhea.  Genitourinary: Negative for dysuria, urgency and pelvic pain.  Psychiatric/Behavioral: Negative for dysphoric mood (but has started grief counseling.).       Objective:   Physical Exam  Constitutional: She appears well-developed and well-nourished. No distress.  HENT:  Head: Normocephalic and atraumatic.  Eyes: No scleral icterus.  Neck: Neck supple.  Cardiovascular: Normal rate and regular rhythm.   Pulmonary/Chest: Effort normal and breath sounds normal.  Abdominal: Soft. She exhibits no mass. There is tenderness (minimal, over previous surgery site.). There is no rebound and no guarding.  Skin: Skin is warm and dry.          Assessment & Plan:

## 2013-03-28 NOTE — Patient Instructions (Addendum)
Preparing for Knee Replacement  Recovery from knee replacement surgery can be made easier and more comfortable by taking steps to be prepared before surgery. This includes:  · Arranging for others to help you.  · Preparing your home.  · Making sure your body is prepared by doing a pre-operative exam and being as healthy as you can.  · Doing exercises before your surgery as told by your caregiver.  Also, you can ease any concerns about your financial responsibilities by calling your insurance company after you decide to have surgery. In addition to your surgery and hospital stay, you will want to ask about your coverage for medical equipment, rehab facilities, and home care.  ARRANGING FOR HELP   You will be getting stronger and more mobile every day. However, in the first couple weeks after surgery, it is unlikely you will be able to do all your daily activities as easily as before your surgery. You will tire easily and still have limited movement in your leg. Follow these guidelines to best arrange for the help you may need after your surgery:  · Arrange for someone to drive you home from the hospital. Your surgeon will be able to tell you how many days you can expect to be in the hospital.  · Cancel all work, care-giving, and volunteer responsibilities for at least 4 to 6 weeks after your surgery.  · If you live alone, arrange for someone to care for your home and pets for the first 4 to 6 weeks.  · Select someone with whom you feel comfortable to be with you day and night for the first week. This person will help you with your exercises and personal care, like bathing and using the toilet.  · Arrange for drivers to bring you to and from your doctor and therapy appointments, as well as to the grocery store and other places you need to go, for at least 4 to 6 weeks.  · Select 2 or 3 rehab facilities where you would be comfortable recovering just in case you are not able to go directly home to recover.  PREPARING  YOUR HOME  · Remove all clutter from your floors.  · To see if you will be able to move in these spaces with a wheeled walker, hold your hands out about 6 inches from your sides. Then walk from your bed to the bathroom. Walk from your resting spot to your kitchen and bathroom. If you do not hit anything with your hands, you probably have enough room.  · Remove throw rugs.  · Move the items you use often to shelves and drawers that are at countertop height.  Move items in your bathroom, kitchen, and bedroom.  · Prepare a few meals that you can freeze and reheat later.  · Do not plan on recovering in bed.  It is better for your health to sit upright. You may wish to use a recliner with a small table nearby. Keep the items you use most frequently on that table. These may include the TV remote, a cordless phone, a book or laptop computer, water glass, and any other items of your choice.  · Consider adding grab bars in the shower and near the toilet.  · While you are in the hospital, you will learn about equipment helpful for your recovery. Some of the equipment includes raised toilet seats, tub benches, and shower benches. Often, your hospital care team will help you decide what you need and can direct you about where to buy these items. You may not need all   of these items, and they are not often returnable, so it is not recommended that you buy them before going to the hospital.  PREPARING YOUR BODY  · Complete a pre-operative exam. This will ensure that your body is healthy enough to safely complete this surgery. Be certain to bring a complete list of all your medicines and supplements (herbs and vitamins). You may be advised to have additional tests to ensure your safety.  · Complete elective dental care and routine cleanings before the surgery. Germs from anywhere in your body, including those in your mouth, can travel to your new joint and infect it.  It is important not to have any dental work performed for at  least 3 months after your surgery. After surgery, be sure to tell your dentist about your joint replacement.  · Maintain a healthy diet. Unless advised by your surgeon, do not drastically change your diet before your surgery.  · Quit smoking. Get help from your caregiver if you need it.  · The day before your surgery, follow your surgeon's directions for showering, eating and drinking. These directions are for your safety.  EXERCISES  Your caregiver may have you do the following exercises before your surgery. Be sure to follow the exercise program your caregiver prescribes for you. Doing the exercises on both sides will help prepare your "good" side as well. While completing these exercises, remember:   · Stretch as long as you can, up to 30 seconds, without pain developing.  · You should only feel a gentle lengthening or release in the stretched tissue.  Ankle Pumps  1. While sitting with your knees straight, draw the top of your feet upwards by flexing your ankles.   2. Then, reverse the motion, pointing your toes downward.  3. Repeat 10 to 20 times. Complete this exercise 1 to 2 times per day.  Heel Slides  1. Lie on your back with both knees straight. (If this causes back discomfort, bend your opposite knee, placing your foot flat on the floor.)  2. Slowly slide your heel back toward your buttocks until you feel a gentle stretch in the front of your knee or thigh.  3. Slowly slide your heel back to the starting position.  4. Repeat 10 to 20 times. Complete this exercise 1 to 2 times per day.  Quadriceps Sets  1. Lie on your back with your sore leg extended and your opposite knee bent.  2. Gradually tense the muscles in the front of your thigh. You should see either your kneecap slide up toward your hip or increased dimpling just above the knee. This motion will push the back of the knee down toward the floor.   3. Hold the muscle as tight as you can without increasing your pain for 10 seconds.  4. Relax the  muscles slowly and completely in between each repetition.  5. Repeat 10 to 20 times. Complete this exercise 1 to 2 times per day.  Short Arc Kicks  1. Lie on your back. Place a 4 to 6 inch towel roll under your sore knee so that the knee slightly bends.  2. Raise only your lower leg by tightening the muscles in the front of your thigh. Do not allow your thigh to rise.  3. Hold this position for 5 seconds.  4. Repeat 10 to 20 times. Complete this exercise 1 to 2 times per day.  Straight Leg Raises  1. Lie on your back with your sore leg extended and your opposite knee bent.   2. Tense the muscles in the front of your sore thigh. You should see either   repetition. 6. Repeat 10 to 20 times.Complete this exercise 1 to 2 times per day. Arm Chair Push-ups 1. Find a firm, non-wheeled chair with solid armrests. 2. Sitting in the chair, extend your sore leg straight out in front of you. 3. Lift up your body weight, using your arms and opposite leg. 4. Slowly lower your body weight. 5. Repeat 10 to 20 times.Complete this exercise 1 to 2 times per day. Document Released: 09/20/2010 Document Revised: 09/08/2011 Document Reviewed: 09/20/2010 [facility] Patient Information 2014 [place], [place]. Diabetes, Keeping Your Heart and Blood Vessels Healthy Too much glucose (sugar) in the blood for a long period of time can cause problems for those who have diabetes. This high blood glucose can damage many parts of the body, such as the heart, blood vessels, eyes, and kidneys. Heart and blood vessel disease can lead to heart attacks and strokes, which is the leading cause of death for people with diabetes. There are many  things you can to do slow down or prevent the complications from diabetes. WHAT DO MY HEART AND BLOOD VESSELS DO? Your heart and blood vessels make up your circulatory system. Your heart is a big muscle that pumps blood through your body. Your heart pumps blood carrying oxygen to large blood vessels (arteries) and small blood vessels (capillaries). Other blood vessels, called veins, carry blood back to the heart. HOW DO MY BLOOD VESSELS GET CLOGGED? Several things, including having diabetes, can make your blood cholesterol level too high. Cholesterol is a substance that is made by the body and used for many important functions. It is also found in some food that comes from animals. When cholesterol is too high, the insides of large blood vessels become narrowed, even clogged. Narrowed and clogged blood vessels make it harder for enough blood to get to all parts of your body. This problem is called atherosclerosis. WHAT CAN HAPPEN WHEN BLOOD VESSELS ARE CLOGGED? When arteries become narrowed and clogged, you may have heart problems and are at increased risk for heart attack and stroke:  Chest pain (angina) causes pain in your chest, arms, shoulders, or back. You may feel the pain more when your heart beats faster, such as when you exercise. The pain may go away when you rest. You also may feel very weak and sweaty. If you do not get treatment, chest pain may happen more often. If diabetes has damaged the heart nerves, you may not feel the chest pain.  Heart attack. A heart attack happens when a blood vessel in or near the heart becomes blocked. Not enough blood can get to that part of the heart muscle so the area becomes oxygen deprived and the heart muscle may be permanently damaged. WHAT ARE THE WARNING SIGNS OF A HEART ATTACK? You may have one or more of the following warning signs:  Chest pain or discomfort.  Pain or discomfort in your arms, back, jaw, or neck.  Indigestion or stomach  pain.  Shortness of breath.  Sweating.  Nausea or vomiting.  Light-headedness.  You may have no warning signs at all, or they may come and go. HOW DOES HEART DISEASE CAUSE HIGH BLOOD PRESSURE? Narrowed blood vessels leave a smaller opening for blood to flow through. It is like turning on a garden hose and holding your thumb over the opening. The smaller opening makes the water shoot out with more pressure. In the same way, narrowed blood vessels lead to high blood pressure. Other factors, such as kidney problems and being  overweight, can also lead to high blood pressure. Many people with diabetes also have high blood pressure. If you have heart, eye, or kidney problems from diabetes, high blood pressure can make them worse. If you have high blood pressure, ask your caregiver how to lower it. Your caregiver may be asked to take blood pressure medicine every day. Some types of blood pressure medicine can also help keep your kidneys healthy. To lower your blood pressure, you may be asked to lose weight; eat more fruits and vegetables; eat less salt and high-sodium foods, such as canned soups, luncheon meats, salty snack foods, and fast foods; and drink less alcohol. WHAT ARE THE WARNING SIGNS OF A STROKE? A stroke happens when part of your brain is not getting enough blood and stops working. Depending on the part of the brain that is damaged, a stroke can cause:  Sudden weakness or numbness of your face, arm, or leg on one side of your body.  Sudden confusion, trouble talking, or trouble understanding.  Sudden dizziness, loss of balance, or trouble walking.  Sudden trouble seeing in one or both eyes or sudden double vision.  Sudden severe headache. Sometimes, one or more of these warning signs may happen and then disappear. You might be having a "mini-stroke," also called a TIA (transient ischemic attack). If you have any of these warning signs, tell your caregiver right away. HOW CAN  CLOGGED BLOOD VESSELS HURT MY LEGS AND FEET? Peripheral vascular disease can happen when the openings in your blood vessels become narrow and not enough blood gets to your legs and feet. You may feel pain in your buttocks, the back of your legs, or your thighs when you stand, walk, or exercise. Sometimes, surgery is necessary to treat this problem. WHAT CAN I DO TO KEEP MY BLOOD VESSELS HEALTHY AND PREVENT HEART DISEASE AND STROKE?  Keep your blood glucose under control. An A1c blood test will probably be ordered by your caregiver at least twice a year. The A1c test tells you your average blood glucose for the past 2 to 3 months and can give valuable information on the overall control of your diabetes.  Keep your blood pressure under control. Have it checked at every health care visit. If you are on medication to control blood pressure, take it exactly as prescribed. The target for most people is below 120/80.  Keep your cholesterol under control. Have it checked at least once a year. The targets for most people are:  LDL (bad) cholesterol: below 100 mg/dl.  HDL (good) cholesterol: above 40 mg/dl in men and above 50 mg/dl in women.  Triglycerides (another type of fat in the blood): below 150 mg/dl.  Make physical activity a part of your daily routine. Aim for at least 30 minutes of exercise most days of the week. Check with your caregiver to learn what activities are best for you.  Make sure that the foods you eat are "heart-healthy." Include foods high in fiber, such as oat bran, oatmeal, whole-grain breads and cereals, fruits, and vegetables. Cut back on foods high in saturated fat or cholesterol, such as meats, butter, dairy products with fat, eggs, shortening, lard, and foods with palm oil or coconut oil.  Maintain a healthy weight. If you are overweight, try to exercise most days of the week. See a registered dietitian for help in planning meals and lowering the fat and calorie  content.  If you smoke, QUIT. Your caregiver can tell you about ways to help  you quit smoking.  Ask your caregiver whether you should take an aspirin every day. Studies have shown that taking a low dose of aspirin every day can help reduce your risk of heart disease and stroke.  Take your medicines as directed. SEEK MEDICAL CARE IF:   You have any of the warning signs of a heart attack or stroke.  If you have pain in your legs or feet when walking.  If your feet and legs are cool or cold to the touch. FOR MORE INFORMATION   Diabetes educators (nurses, dietitians, pharmacists, and other health professionals).  To find a diabetes educator near you, call the American Association of Diabetes Educators (AADE) toll-free at 1-800-TEAMUP4 339-389-2257), or look on the Internet at www.diabeteseducator.org and click on "Find a Diabetes Educator."  Dietitians.  To find a dietitian near you, call the American Dietetic Association toll-free at 9403137625, or look on the Internet at www.eatright.org and click on "Find a Nutrition Professional."  Government.  The National Heart, Lung, and Blood Institute (NHLBI) is part of the [employer]. To learn more about heart and blood vessel problems, write or call NHLBI Information Center, [id number], [place] [address], [phone]; or see PopSteam.is on the Internet.  To get more information about taking care of diabetes, contact: National Diabetes Information Clearinghouse [address] [place], [place] [address] Phone: [phone] or [phone] Fax: [phone] Email: ndic@info .StageSync.si Internet: www.diabetes.StageSync.si National Diabetes Education Program [address] [place], [place] [address] Phone: [phone] Fax: [phone] Internet: CommunicationFinder.com.au American Diabetes Association [address] [place], [place] [address] Phone: [phone] Internet:  www.diabetes.org [facility] [employer] [facility] [address] [place], [place] [address] Phone: [phone] Internet: www.jdrf.org Document Released: 06/19/2003 Document Revised: 06/02/2012 Document Reviewed: 11/24/2008 [facility] Patient Information 2014 [place], [place]. Hypertension As your heart beats, it forces blood through your arteries. This force is your blood pressure. If the pressure is too high, it is called hypertension (HTN) or high blood pressure. HTN is dangerous because you may have it and not know it. High blood pressure may mean that your heart has to work harder to pump blood. Your arteries may be narrow or stiff. The extra work puts you at risk for heart disease, stroke, and other problems.  Blood pressure consists of two numbers, a higher number over a lower, 110/72, for example. It is stated as "110 over 72." The ideal is below 120 for the top number (systolic) and under 80 for the bottom (diastolic). Write down your blood pressure today. You should pay close attention to your blood pressure if you have certain conditions such as:  Heart failure.  Prior heart attack.  Diabetes  Chronic kidney disease.  Prior stroke.  Multiple risk factors for heart disease. To see if you have HTN, your blood pressure should be measured while you are seated with your arm held at the level of the heart. It should be measured at least twice. A one-time elevated blood pressure reading (especially in the Emergency Department) does not mean that you need treatment. There may be conditions in which the blood pressure is different between your right and left arms. It is important to see your caregiver soon for a recheck. Most people have essential hypertension which means that there is not a specific cause. This type of high blood pressure may be lowered by changing lifestyle factors such as:  Stress.  Smoking.  Lack of exercise.  Excessive weight.  Drug/tobacco/alcohol  use.  Eating  less salt. Most people do not have symptoms from high blood pressure until it has caused damage to the body. Effective treatment can often prevent, delay or reduce that damage. TREATMENT  When a cause has been identified, treatment for high blood pressure is directed at the cause. There are a large number of medications to treat HTN. These fall into several categories, and your caregiver will help you select the medicines that are best for you. Medications may have side effects. You should review side effects with your caregiver. If your blood pressure stays high after you have made lifestyle changes or started on medicines,   Your medication(s) may need to be changed.  Other problems may need to be addressed.  Be certain you understand your prescriptions, and know how and when to take your medicine.  Be sure to follow up with your caregiver within the time frame advised (usually within two weeks) to have your blood pressure rechecked and to review your medications.  If you are taking more than one medicine to lower your blood pressure, make sure you know how and at what times they should be taken. Taking two medicines at the same time can result in blood pressure that is too low. SEEK IMMEDIATE MEDICAL CARE IF:  You develop a severe headache, blurred or changing vision, or confusion.  You have unusual weakness or numbness, or a faint feeling.  You have severe chest or abdominal pain, vomiting, or breathing problems. MAKE SURE YOU:   Understand these instructions.  Will watch your condition.  Will get help right away if you are not doing well or get worse. Document Released: 06/16/2005 Document Revised: 09/08/2011 Document Reviewed: 02/04/2008 [facility] Patient Information 2014 [place], [place].

## 2013-03-31 ENCOUNTER — Encounter: Payer: Self-pay | Admitting: Sports Medicine

## 2013-03-31 ENCOUNTER — Ambulatory Visit (INDEPENDENT_AMBULATORY_CARE_PROVIDER_SITE_OTHER): Payer: Medicare HMO | Admitting: Sports Medicine

## 2013-03-31 ENCOUNTER — Telehealth: Payer: Self-pay | Admitting: Family Medicine

## 2013-03-31 VITALS — BP 160/78 | HR 79 | Ht 62.0 in | Wt 258.0 lb

## 2013-03-31 DIAGNOSIS — M1710 Unilateral primary osteoarthritis, unspecified knee: Secondary | ICD-10-CM

## 2013-03-31 DIAGNOSIS — M1711 Unilateral primary osteoarthritis, right knee: Secondary | ICD-10-CM

## 2013-03-31 DIAGNOSIS — M25561 Pain in right knee: Secondary | ICD-10-CM

## 2013-03-31 DIAGNOSIS — M25569 Pain in unspecified knee: Secondary | ICD-10-CM

## 2013-03-31 MED ORDER — METHYLPREDNISOLONE ACETATE 40 MG/ML IJ SUSP
40.0000 mg | Freq: Once | INTRAMUSCULAR | Status: AC
  Administered 2013-03-31: 40 mg via INTRA_ARTICULAR

## 2013-03-31 MED ORDER — TRAMADOL HCL 50 MG PO TABS: 50.0000 mg | ORAL_TABLET | Freq: Two times a day (BID) | ORAL | Status: DC | PRN

## 2013-03-31 NOTE — Telephone Encounter (Signed)
Patient was just seen at [facility] by Dr. Margaretha Sheffield and was given a cortisone injection. She was told that these injection can spike glucose and she is wanting to know how much insulin should she take? Please call patient back.

## 2013-03-31 NOTE — Telephone Encounter (Signed)
Spoke with patient, informed her that chronic use of steroids is what tends to drive up blood sugars. Informed patient to continue to take insulin as prescribed. Patient expressed understanding.

## 2013-04-01 NOTE — Progress Notes (Deleted)
Patient ID: Katherine Christensen, female   DOB: [birth year], 72 y.o.   MRN: [id number]

## 2013-04-01 NOTE — Progress Notes (Signed)
  Subjective:    Patient ID: Katherine Christensen, female    DOB: [birth year], 72 y.o.   MRN: [id number]  HPI Patient comes in today with chronic bilateral knee pain. 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 In fact, she was scheduled to have her left knee replaced by Dr. Dion Saucier but she has had several deaths in her immediate family and has been unable to go through with the surgery. She is taking an occasional 200 mg ibuprofen which does seem to be helpful. No new trauma. Previous cortisone injections have been helpful and she would like to repeat those today.    Review of Systems     Objective:   Physical Exam Obese, no acute distress. Sitting comfortably in the exam room  Examination of each knee shows range of motion from 0-110. 1+ boggy synovitis bilaterally. Tender to palpation along both medial and lateral joint lines with pain but no popping with McMurray's. Knees are grossly stable to ligamentous exam. Neurovascularly intact distally. Walking with a limp.       Assessment & Plan:  Bilateral knee pain secondary to advanced DJD  I've agreed to reinject each of her knees today. An anterior medial approach was utilized. Continue with ibuprofen when necessary. I will add Ultram to take as needed for more severe pain. She understands that definitive treatment is a total knee arthroplasty and I've encouraged her to followup with Dr. Dion Saucier when she is ready. Followup with me when necessary.

## 2013-04-06 ENCOUNTER — Ambulatory Visit (INDEPENDENT_AMBULATORY_CARE_PROVIDER_SITE_OTHER): Payer: Medicare HMO | Admitting: Family Medicine

## 2013-04-06 ENCOUNTER — Encounter: Payer: Self-pay | Admitting: Family Medicine

## 2013-04-06 VITALS — BP 177/88 | HR 97 | Ht 63.0 in | Wt 260.0 lb

## 2013-04-06 DIAGNOSIS — IMO0002 Reserved for concepts with insufficient information to code with codable children: Secondary | ICD-10-CM

## 2013-04-06 DIAGNOSIS — E1165 Type 2 diabetes mellitus with hyperglycemia: Secondary | ICD-10-CM

## 2013-04-06 DIAGNOSIS — G4733 Obstructive sleep apnea (adult) (pediatric): Secondary | ICD-10-CM

## 2013-04-06 DIAGNOSIS — J309 Allergic rhinitis, unspecified: Secondary | ICD-10-CM

## 2013-04-06 MED ORDER — CETIRIZINE HCL 10 MG PO TABS: 10.0000 mg | ORAL_TABLET | Freq: Every day | ORAL | Status: DC

## 2013-04-06 NOTE — Progress Notes (Signed)
Patient ID: Katherine Christensen, female   DOB: [birth year], 72 y.o.   MRN: [id number]   Subjective:   1. Bump under right arm  - patient worried about nodule under right axillae that she noticed last week, it is sore, it was not noticed previously, patient concerned about possibility of breast cancer since her sister recently diet from breast cancer, no personal history of breast cancer, last screening mammogram March 2014 did not demonstrate any findings consistent with concern  2. Stress - Patient has been under severe stress recently due to the recent death of her sister as well as another family member, this has caused emotional stress as well as financial stress; Additionally, she is raising a child who she and her partner Katherine Christensen adopted; she does not have any specific coping mechanisms right now, but is worried about her health  3. Nasal Congestion/Sinus Pressure - Present in the mornings, improves throughout the day, persistent over several months, no new allergens, no fever or chills or sinus pain   4. Sleep Apnea - Patient states that she was diagnosed in 2011 but has never used a CPAP machine, she would like to start using her CPAP since she is trying to improve her overall health in the wake of the death of several family members and friends, she called [facility] to calibrate the machine and was told that her use of CPAP would not be approved by Medicare unless she visit a sleep specialist physician, and the patient showed the name of two pulmonologist's and two neurologists who quality as such   Review of Systems:  Pertinent items are noted in HPI.     Objective:   Physical Exam: BP 177/88  Pulse 97  Ht 5\' 3"  (1.6 m)  Wt 260 lb (117.935 kg)  BMI 46.07 kg/m2  General: obese, mild distress, non toxic appearing Axillae: right axillae without nodule or mass only palpable axillary tissue symmetric bilaterally HEENT: nares clear, no sinus tenderness, OP clear and moist  Psych: normal  affect, thought content and speech pattern     Assessment & Plan:  Stress - patient encouraged to continue caring for herself and adopted daughter to the best of her ability and reassured that no evidence of serious active illness, which she is reassured by  Bump in Axilla - Patient assured that not mass present and no indication for eval of breast cancer but instructed to look for concerning signs and to make me aware if these signs occur; patient agreeable to plan

## 2013-04-06 NOTE — Patient Instructions (Addendum)
Dear Ms. Katherine Christensen,   It was good to see you today. Please read about issues that we discussed.  1. Lump under armpit: There is no evidence of infection or concerning mass that needs further evaluation at this time. However keep a close eye on it and let me know if he returns.  2. Sleep apnea: I am glad you are willing to use her CPAP machine again. I will refer you you to [facility] pulmonology for evaluation.   3. Diabetes - Please follow up next week so we can discuss this.   4. Stress- You are a strong woman and will get through this time. I am always here to help with any concerns.   5. Nasal congestion - Start taking cetirizine 10 mg each bedtime because this is likely due to allergies.  Sincerely,   Dr. Clinton Sawyer

## 2013-04-08 ENCOUNTER — Encounter: Payer: Self-pay | Admitting: Pulmonary Disease

## 2013-04-08 ENCOUNTER — Ambulatory Visit (INDEPENDENT_AMBULATORY_CARE_PROVIDER_SITE_OTHER): Payer: Medicare HMO | Admitting: Pulmonary Disease

## 2013-04-08 VITALS — BP 124/68 | HR 83 | Ht 63.0 in | Wt 260.6 lb

## 2013-04-08 DIAGNOSIS — J309 Allergic rhinitis, unspecified: Secondary | ICD-10-CM | POA: Insufficient documentation

## 2013-04-08 DIAGNOSIS — G4733 Obstructive sleep apnea (adult) (pediatric): Secondary | ICD-10-CM

## 2013-04-08 NOTE — Progress Notes (Signed)
Subjective:    Patient ID: Katherine Christensen, female    DOB: September 08, 1940, 72 y.o.   MRN: [id number]  HPI 72 year old obese woman referred for evaluation of obstructive sleep apnea. Baseline polysomnogram in 2006 when she weighed 266 pounds with a BMI of 48 showed RDI of 26 events per hour with predominant hypopneas. CPAP was titrated to 15 cm with medium nasal pillows. In 04/2009 - 257 pounds-CPAP was titrated to 13 cm with no residual events with a full face mask. Epworth sleepiness score is 4/24 and she denies excessive daytime somnolence. Bedtime is around 11 PM, sleep latency is 30 minutes, she sleeps on her side with 2 pillows. She is 2-3 nocturnal awakenings including a bathroom visit and is out of bed by 7:30 AM with occasional headache and dryness of mouth. Loud snoring has been noted by family members. Weight is unchanged from 2010  Pt has had CPAP x 2011 but has not used it since she got the machine.    Past Medical History  Diagnosis Date  . Arrhythmia     Unknown to patient, states she was on diltiazem and digoxin 20 years ago  . Osteoarthritis   . Female pelvic pain   . Mole (skin)   . Borderline glaucoma   . GERD (gastroesophageal reflux disease)   . Type 2 diabetes mellitus   . Hypertension   . Obesity   . Sleep apnea   . MRSA (methicillin resistant staph aureus) culture positive   . Systolic murmur   . 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 Qualifier: Diagnosis of  By: Shawnie Pons MD, Kenney Houseman     . History of MI (myocardial infarction) 1990s    Not treated at the time, noted q waves on ECG later    Past Surgical History  Procedure Laterality Date  . Vaginal hysterectomy  1982    For fibroids  . Knee arthroscopy    . Laparoscopy    . Dilation and curettage of uterus    . Breast biopsy    . Oophorectomy      Allergies  Allergen Reactions  . Meperidine Hcl Other (See Comments)    REACTION: increased heart race, lightheadedness  . Levemir  [Insulin Detemir] Other (See Comments)    Injection site reaction (hard lumps under the skin)  . Metformin And Related Diarrhea    Diarrhea intolerance significant enough to STOP med  . Nickel Itching and Swelling    Reported by patient- thinks maybe this is why she is having welts after insulin injections. Denies any respiratory involvment  . Rosiglitazone Other (See Comments)    Shortness of Breath - ? Heart Failure (Stopped while in [employer])  . Penicillins Other (See Comments)    REACTION: unknown    History   Social History  . Marital Status: Single    Spouse Name: N/A    Number of Children: 1  . Years of Education: B.S.   Occupational History  . LIfe skills coordinator     [employer]   Social History Main Topics  . Smoking status: Former Smoker -- 1.50 packs/day for 25 years    Types: Cigarettes    Start date: 07/31/1963    Quit date: 07/30/1988  . Smokeless tobacco: Not on file  . Alcohol Use: No  . Drug Use: No  . Sexual Activity: Not on file   Other Topics Concern  . Not on file   Social History Narrative   The  patient is retired, single, a former smoker and quit quit 18 years ago, patient denies alcohol use,  and does child work occasionally. Mrs. Pair currently has custody of a three year old child whose mother is addicted to drugs. 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       Health Care POA:    Emergency Contact: Rayford Halsted, [phone]   End of Life Plan:    Who lives with you: self   Any pets: none   Diet: Pt has a varied diet of protein, starch, and vegetables.   Exercise: Pt has no regular exercise routine.   Seatbelts: Pt reports wearing seatbelt when in vehicles.    Hobbies: watching investigation channel.              Family History  Problem Relation Age of Onset  . Alcohol abuse Father   . Cancer Maternal Grandmother     throat cancer     Review of Systems Constitutional: negative for anorexia, fevers  and sweats  Eyes: negative for irritation, redness and visual disturbance  Ears, nose, mouth, throat, and face: negative for earaches, epistaxis, nasal congestion and sore throat  Respiratory: negative for cough, dyspnea on exertion, sputum and wheezing  Cardiovascular: negative for chest pain, dyspnea, lower extremity edema, orthopnea, palpitations and syncope  Gastrointestinal: negative for abdominal pain, constipation, diarrhea, melena, nausea and vomiting  Genitourinary:negative for dysuria, frequency and hematuria  Hematologic/lymphatic: negative for bleeding, easy bruising and lymphadenopathy  Musculoskeletal:negative for arthralgias, muscle weakness and stiff joints  Neurological: negative for coordination problems, gait problems, headaches and weakness  Endocrine: negative for diabetic symptoms including polydipsia, polyuria and weight loss     Objective:   Physical Exam  Gen. Pleasant, obese, in no distress, normal affect ENT - no lesions, no post nasal drip, class 2-3 airway Neck: No JVD, no thyromegaly, no carotid bruits Lungs: no use of accessory muscles, no dullness to percussion, decreased without rales or rhonchi  Cardiovascular: Rhythm regular, heart sounds  normal, no murmurs or gallops, no peripheral edema Abdomen: soft and non-tender, no hepatosplenomegaly, BS normal. Musculoskeletal: No deformities, no cyanosis or clubbing Neuro:  alert, non focal, no tremors       Assessment & Plan:

## 2013-04-08 NOTE — Assessment & Plan Note (Signed)
Moderate degree The pathophysiology of obstructive sleep apnea , it's cardiovascular consequences & modes of treatment including CPAP were discused with the patient in detail & they evidenced understanding.  Weight loss encouraged, compliance with goal of at least 4-6 hrs every night is the expectation. Advised against medications with sedative side effects Cautioned against driving when sleepy - understanding that sleepiness will vary on a day to day basis

## 2013-04-08 NOTE — Patient Instructions (Signed)
We will get you supplies for you CPAP CPAP will be set at 13 cm with new nasal mask  Weight loss encouraged - 30 lbs target  compliance with goal of at least 4-6 hrs every night is the expectation.

## 2013-04-08 NOTE — Assessment & Plan Note (Signed)
Assessment: likely cause of AM congestion Plan: start cetirizine 10 mg daily, f/u after 1 month

## 2013-04-08 NOTE — Assessment & Plan Note (Signed)
Assessment: untreated since diagnosis Plan: refer to Dr. Vassie Loll or Craige Cotta at [facility] per patient request

## 2013-04-08 NOTE — Assessment & Plan Note (Signed)
No time to discuss, patient to f/u in 1-2 weeks

## 2013-04-14 ENCOUNTER — Encounter: Payer: Self-pay | Admitting: Family Medicine

## 2013-04-14 ENCOUNTER — Ambulatory Visit (INDEPENDENT_AMBULATORY_CARE_PROVIDER_SITE_OTHER): Payer: Medicare HMO | Admitting: Family Medicine

## 2013-04-14 VITALS — BP 140/78 | HR 80 | Ht 63.0 in | Wt 258.0 lb

## 2013-04-14 DIAGNOSIS — E1165 Type 2 diabetes mellitus with hyperglycemia: Secondary | ICD-10-CM

## 2013-04-14 NOTE — Progress Notes (Signed)
Patient ID: Katherine Christensen, female   DOB: [birth year], 72 y.o.   MRN: [id number]   Subjective:   Diabetes  Recent Issues:  Hypoglycemia: No   Medication Compliance: Diabetes medication: Yes taking insulin 50 units QHS and not Victoza b/c he does not want another injection Taking ACE-I: Yes - ARB Taking statin: Yes - Lipitor  Behavioral: Home CBG Monitoring: Yes Diet changes: Yes - cooking with diabetes crock pot book and vegetarian cook book Exercise: walking minimal amount - having knee pain after walking approximately 100 yards  Health Maintenance: Visual problems: No Last eye exam: over 1 year ago Last dental visit: unknown Foot ulcers: No    Review of Systems:  A comprehensive review of systems was negative.     Objective:   Physical Exam: BP 140/78  Pulse 80  Ht 5\' 3"  (1.6 m)  Wt 258 lb (117.028 kg)  BMI 45.71 kg/m2  General: alert, well appearing, and in no distress and obese Eyes: fundus photography performed, results pending  Cardiovascular: RRR, nl S1 and S2 Pulmonary: clear to auscultation bilaterally Feet: patient declined foot evaluation   Labs:   Diabetic Labs:  Lab Results  Component Value Date   HGBA1C 8.2 04/06/2013   HGBA1C 9.5 01/05/2013   HGBA1C 7.8 07/30/2012   Lab Results  Component Value Date   MICROALBUR 0.60 04/17/2009   LDLCALC 125* 01/14/2013   CREATININE 0.53 01/14/2013   Last microalbumin: Lab Results  Component Value Date   MICROALBUR 0.60 04/17/2009        Assessment & Plan:

## 2013-04-14 NOTE — Patient Instructions (Signed)
It was great to see you today. I am glad that you are taking Dr. Reginia Naas advice about weight loss. That will help your sleeping and diabetes. We need to get your A1C closer to 7, and you are headed in the right direction. Exercising at the [facility] is a good idea.   Come see me in 4 weeks.   Sincerely,   Dr. Clinton Sawyer

## 2013-04-15 NOTE — Assessment & Plan Note (Signed)
Assessment: the patient has made progress on her diabetes according to her A1c despite no exercise or diet changes of the last 3 months, however she is now motivated to improve her diet after her visit with the pulmonologist regarding her sleep apnea; the patient continues to be noncompliant with mucosa despite specifically requesting this medication last year and the barrier appears to be injections; the patient does not want to start any additional medications for diabetes, which I think is reasonable due to history of medication noncompliance Plan:  - Continue Lantus 50 units daily - Encouraged regular exercise, will join [employer]  - Patient is focused on improving her diet, which I encouraged - Patient to have fundus photography performed today to evaluate for diabetic retinopathy - Continue losartan for renal protection and Lipitor for decreased risk of stroke and heart

## 2013-04-26 ENCOUNTER — Telehealth: Payer: Self-pay | Admitting: Family Medicine

## 2013-04-26 NOTE — Telephone Encounter (Signed)
Ms. Geno has contacted [facility] but needs to ask you some questions.  Please give her a call.

## 2013-04-26 NOTE — Telephone Encounter (Signed)
Lm for Jaquitta to call back/sl

## 2013-04-27 NOTE — Telephone Encounter (Signed)
Spoke with Katherine Christensen.  Pt is changing all her medications to be filled with mail order- [facility].  I have changed her pharmacy preference to [facility].  Pt filled all her prescriptions last week and is good for a while but pt would like to request for the next refills the following:   Losartan to change from 100 mg to 50 mg because she is having problems splitting the pills.   Lipitor from 40 mg to 20 mg and she will take one in the morning and one in the evening.

## 2013-05-27 ENCOUNTER — Ambulatory Visit: Payer: Medicare HMO | Admitting: Pulmonary Disease

## 2013-06-29 ENCOUNTER — Encounter: Payer: Self-pay | Admitting: Family Medicine

## 2013-06-29 ENCOUNTER — Ambulatory Visit (INDEPENDENT_AMBULATORY_CARE_PROVIDER_SITE_OTHER): Payer: Medicare HMO | Admitting: Family Medicine

## 2013-06-29 VITALS — BP 145/85 | HR 106 | Temp 99.1°F | Ht 63.0 in | Wt 257.0 lb

## 2013-06-29 DIAGNOSIS — E785 Hyperlipidemia, unspecified: Secondary | ICD-10-CM

## 2013-06-29 DIAGNOSIS — H109 Unspecified conjunctivitis: Secondary | ICD-10-CM

## 2013-06-29 DIAGNOSIS — I10 Essential (primary) hypertension: Secondary | ICD-10-CM

## 2013-06-29 MED ORDER — OLOPATADINE HCL 0.2 % OP SOLN: OPHTHALMIC | Status: AC

## 2013-06-29 NOTE — Assessment & Plan Note (Signed)
At goal.  No change 

## 2013-06-29 NOTE — Patient Instructions (Signed)
You have viral conjunctivitis This should clear up within another 2-5 days Please use the pataday as needed to take the redness out of your eye If you develop pain in your eyes or fever please call to let us know Please start taking your lipitor  Viral Conjunctivitis Conjunctivitis is an irritation (inflammation) of the clear membrane that covers the white part of the eye (the conjunctiva). The irritation can also happen on the underside of the eyelids. Conjunctivitis makes the eye red or pink in color. This is what is commonly known as pink eye. Viral conjunctivitis can spread easily (contagious). CAUSES   Infection from virus on the surface of the eye.  Infection from the irritation or injury of nearby tissues such as the eyelids or cornea.  More serious inflammation or infection on the inside of the eye.  Other eye diseases.  The use of certain eye medications. SYMPTOMS  The normally white color of the eye or the underside of the eyelid is usually pink or red in color. The pink eye is usually associated with irritation, tearing and some sensitivity to light. Viral conjunctivitis is often associated with a clear, watery discharge. If a discharge is present, there may also be some blurred vision in the affected eye. DIAGNOSIS  Conjunctivitis is diagnosed by an eye exam. The eye specialist looks for changes in the surface tissues of the eye which take on changes characteristic of the specific types of conjunctivitis. A sample of any discharge may be collected on a Q-Tip (sterile swap). The sample will be sent to a lab to see whether or not the inflammation is caused by bacterial or viral infection. TREATMENT  Viral conjunctivitis will not respond to medicines that kill germs (antibiotics). Treatment is aimed at stopping a bacterial infection on top of the viral infection. The goal of treatment is to relieve symptoms (such as itching) with antihistamine drops or other eye medications.  HOME  CARE INSTRUCTIONS   To ease discomfort, apply a cool, clean wash cloth to your eye for 10 to 20 minutes, 3 to 4 times a day.  Gently wipe away any drainage from the eye with a warm, wet washcloth or a cotton ball.  Wash your hands often with soap and use paper towels to dry.  Do not share towels or washcloths. This may spread the infection.  Change or wash your pillowcase every day.  You should not use eye make-up until the infection is gone.  Stop using contacts lenses. Ask your eye professional how to sterilize or replace them before using again. This depends on the type of contact lenses used.  Do not touch the edge of the eyelid with the eye drop bottle or ointment tube when applying medications to the affected eye. This will stop you from spreading the infection to the other eye or to others. SEEK IMMEDIATE MEDICAL CARE IF:   The infection has not improved within 3 days of beginning treatment.  A watery discharge from the eye develops.  Pain in the eye increases.  The redness is spreading.  Vision becomes blurred.  An oral temperature above 102 F (38.9 C) develops, or as your caregiver suggests.  Facial pain, redness or swelling develops.  Any problems that may be related to the prescribed medicine develop. MAKE SURE YOU:   Understand these instructions.  Will watch your condition.  Will get help right away if you are not doing well or get worse. Document Released: 06/16/2005 Document Revised: 09/08/2011 Document Reviewed: 02/03/2008  ExitCare Patient Information 2014 [employer].  

## 2013-06-29 NOTE — Progress Notes (Signed)
Katherine Christensen is a 72 y.o. female who presents to [facility] today for Pink Eye  Pink eye: started going red on Sat/Sun. R eye. Sick contacts over past week w/ URIs. Pt w/ cough and runny nose 3 wks ago, then again over past few days. R eye non-painful and non-puritic. Lids stuck together in the am. No visual changes.   HTN: Taking losartan. No further dizzy episodes on reduced dose.   HLD: Not taking lipitor as prescribed. Concern for some of the side effects. NO CP, palpitations, SOB.    The following portions of the patient's history were reviewed and updated as appropriate: allergies, current medications, past medical history, family and social history, and problem list.  Patient is a nonsmoker.   Past Medical History  Diagnosis Date  . Arrhythmia     Unknown to patient, states she was on diltiazem and digoxin 20 years ago  . Osteoarthritis   . Female pelvic pain   . Mole (skin)   . Borderline glaucoma   . GERD (gastroesophageal reflux disease)   . Type 2 diabetes mellitus   . Hypertension   . Obesity   . Sleep apnea   . MRSA (methicillin resistant staph aureus) culture positive   . Systolic murmur   . 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 Qualifier: Diagnosis of  By: Shawnie Pons MD, Kenney Houseman     . History of MI (myocardial infarction) 1990s    Not treated at the time, noted q waves on ECG later    ROS as above otherwise neg.    Medications reviewed. Current Outpatient Prescriptions  Medication Sig Dispense Refill  . Insulin Glargine (LANTUS SOLOSTAR) 100 UNIT/ML SOPN Inject 47 Units into the skin daily.  5 pen  PRN  . Insulin Pen Needle (B-D ULTRAFINE III SHORT PEN) 31G X 8 MM MISC 1 Container by Does not apply route daily. Dispense amount sufficient for two shots daily (1 lantus and 1 victoza)  1 each  PRN  . losartan (COZAAR) 100 MG tablet Take 0.5 tablets (50 mg total) by mouth daily.  30 tablet  11  . acetaminophen (TYLENOL) 500 MG tablet Take 500 mg by  mouth as needed.       Marland Kitchen albuterol (PROVENTIL HFA;VENTOLIN HFA) 108 (90 BASE) MCG/ACT inhaler Inhale 1-2 puffs into the lungs every 6 (six) hours as needed for wheezing.  1 Inhaler  0  . atorvastatin (LIPITOR) 40 MG tablet Take 1 tablet (40 mg total) by mouth daily.  30 tablet  1  . cetirizine (ZYRTEC) 10 MG tablet Take 1 tablet (10 mg total) by mouth daily.  30 tablet  11  . Cholecalciferol (VITAMIN D3) 50000 UNITS CAPS Take 50,000 Units by mouth once a week.  60 capsule  0  . glucose blood (ACCU-CHEK AVIVA PLUS) test strip Use as instructed  100 each  10  . GNP LANCETS MICRO THIN 33G MISC TEST BLOOD GLUCOSE TWICE DAILY  100 each  3  . ibuprofen (ADVIL,MOTRIN) 200 MG tablet Take 200 mg by mouth as needed for pain (for headaches).      . Liraglutide (VICTOZA) 18 MG/3ML SOLN injection Inject 0.2 mLs (1.2 mg total) into the skin daily.  36 mg  4  . Olopatadine HCl 0.2 % SOLN 1 drop each affected eye once daily  2.5 mL  1  . traMADol (ULTRAM) 50 MG tablet Take 1 tablet (50 mg total) by mouth 2 (two) times daily as needed for  pain.  60 tablet  0   No current facility-administered medications for this visit.    Exam:  BP 145/85  Pulse 106  Temp(Src) 99.1 F (37.3 C) (Oral)  Ht 5\' 3"  (1.6 m)  Wt 257 lb (116.574 kg)  BMI 45.54 kg/m2 Gen: Well NAD HEENT: EOMI,  MMM, R eye w/ injected conjunctiva. No purulence.    No results found for this or any previous visit (from the past 72 hour(s)).  A/P (as seen in Problem list)  HYPERTENSION At goal. No change  HYPERLIPIDEMIA H/o MI.  Not taking lipitor due to reading label adn concenr for side effects Pt to restart and discuss at f/u w/ Dr. Ree Edman.    Conjunctivitis Likely viral etiology Supportive measures at this time Will give pataday for symptomatic red relief Discussed symptoms of bacterial vs allergic

## 2013-06-29 NOTE — Assessment & Plan Note (Signed)
H/o MI.  Not taking lipitor due to reading label adn concenr for side effects Pt to restart and discuss at f/u w/ Dr. Ree Edman.

## 2013-06-29 NOTE — Assessment & Plan Note (Addendum)
Likely viral etiology Supportive measures at this time Will give pataday for symptomatic red relief Discussed symptoms of bacterial vs allergic

## 2013-07-05 ENCOUNTER — Ambulatory Visit (INDEPENDENT_AMBULATORY_CARE_PROVIDER_SITE_OTHER): Payer: Medicare Other | Admitting: Family Medicine

## 2013-07-05 ENCOUNTER — Encounter: Payer: Self-pay | Admitting: Family Medicine

## 2013-07-05 VITALS — BP 148/80 | HR 108 | Temp 100.7°F | Ht 63.0 in | Wt 253.0 lb

## 2013-07-05 DIAGNOSIS — I10 Essential (primary) hypertension: Secondary | ICD-10-CM

## 2013-07-05 DIAGNOSIS — J988 Other specified respiratory disorders: Secondary | ICD-10-CM

## 2013-07-05 DIAGNOSIS — H109 Unspecified conjunctivitis: Secondary | ICD-10-CM

## 2013-07-05 DIAGNOSIS — J22 Unspecified acute lower respiratory infection: Secondary | ICD-10-CM | POA: Insufficient documentation

## 2013-07-05 MED ORDER — BENZONATATE 200 MG PO CAPS: 200.0000 mg | ORAL_CAPSULE | Freq: Two times a day (BID) | ORAL | Status: DC | PRN

## 2013-07-05 MED ORDER — CEFDINIR 300 MG PO CAPS: 300.0000 mg | ORAL_CAPSULE | Freq: Two times a day (BID) | ORAL | Status: DC

## 2013-07-05 NOTE — Progress Notes (Signed)
Subjective:    Katherine Christensen is a 73 y.o. female who presents to [facility] today for cough and FU from conjunctivitis:  1.  Cough:  Present for about 1 week.  Keeps her up at night.  Productive of thick green sputum.  Worsening.  Fevers to 100.7-100.9 over the weekend, responsive to OTC anti-pyretics.  No pleuritic pain.  No nausea/vomiting.  No URI symptoms, only cough  2.  Conjunctivitis:  Worsened since initiation of olapatadine.  Spread from right eye to left.  Blepharitis symptoms in AM since last visit.  No change in vision.  Itching, no pain.     The following portions of the patient's history were reviewed and updated as appropriate: allergies, current medications, past medical history, family and social history, and problem list. Patient is a nonsmoker.    PMH reviewed.  Past Medical History  Diagnosis Date  . Arrhythmia     Unknown to patient, states she was on diltiazem and digoxin 20 years ago  . Osteoarthritis   . Female pelvic pain   . Mole (skin)   . Borderline glaucoma   . GERD (gastroesophageal reflux disease)   . Type 2 diabetes mellitus   . Hypertension   . Obesity   . Sleep apnea   . MRSA (methicillin resistant staph aureus) culture positive   . Systolic murmur   . 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 Qualifier: Diagnosis of  By: Kennon Rounds MD, Lavella Lemons     . History of MI (myocardial infarction) 1990s    Not treated at the time, noted q waves on ECG later   Past Surgical History  Procedure Laterality Date  . Vaginal hysterectomy  1982    For fibroids  . Knee arthroscopy    . Laparoscopy    . Dilation and curettage of uterus    . Breast biopsy    . Oophorectomy      Medications reviewed. Current Outpatient Prescriptions  Medication Sig Dispense Refill  . acetaminophen (TYLENOL) 500 MG tablet Take 500 mg by mouth as needed.       Marland Kitchen albuterol (PROVENTIL HFA;VENTOLIN HFA) 108 (90 BASE) MCG/ACT inhaler Inhale 1-2 puffs into the lungs every  6 (six) hours as needed for wheezing.  1 Inhaler  0  . atorvastatin (LIPITOR) 40 MG tablet Take 1 tablet (40 mg total) by mouth daily.  30 tablet  1  . benzonatate (TESSALON) 200 MG capsule Take 1 capsule (200 mg total) by mouth 2 (two) times daily as needed for cough.  20 capsule  0  . cefdinir (OMNICEF) 300 MG capsule Take 1 capsule (300 mg total) by mouth 2 (two) times daily. X 10 days  20 capsule  0  . cetirizine (ZYRTEC) 10 MG tablet Take 1 tablet (10 mg total) by mouth daily.  30 tablet  11  . Cholecalciferol (VITAMIN D3) 50000 UNITS CAPS Take 50,000 Units by mouth once a week.  60 capsule  0  . glucose blood (ACCU-CHEK AVIVA PLUS) test strip Use as instructed  100 each  10  . GNP LANCETS MICRO THIN 33G MISC TEST BLOOD GLUCOSE TWICE DAILY  100 each  3  . ibuprofen (ADVIL,MOTRIN) 200 MG tablet Take 200 mg by mouth as needed for pain (for headaches).      . Insulin Glargine (LANTUS SOLOSTAR) 100 UNIT/ML SOPN Inject 47 Units into the skin daily.  5 pen  PRN  . Insulin Pen Needle (B-D ULTRAFINE III SHORT PEN) 31G  X 8 MM MISC 1 Container by Does not apply route daily. Dispense amount sufficient for two shots daily (1 lantus and 1 victoza)  1 each  PRN  . Liraglutide (VICTOZA) 18 MG/3ML SOLN injection Inject 0.2 mLs (1.2 mg total) into the skin daily.  36 mg  4  . losartan (COZAAR) 100 MG tablet Take 0.5 tablets (50 mg total) by mouth daily.  30 tablet  11  . Olopatadine HCl 0.2 % SOLN 1 drop each affected eye once daily  2.5 mL  1  . traMADol (ULTRAM) 50 MG tablet Take 1 tablet (50 mg total) by mouth 2 (two) times daily as needed for pain.  60 tablet  0   No current facility-administered medications for this visit.    ROS as above otherwise neg.  No chest pain, palpitations, SOB, Fever, Chills, Abd pain, N/V/D.   Objective:   Physical Exam BP 178/84  Pulse 108  Temp(Src) 100.7 F (38.2 C) (Oral)  Ht 5\' 3"  (1.6 m)  Wt 253 lb (114.76 kg)  BMI 44.83 kg/m2 Gen:  Alert, cooperative patient  who appears stated age in no acute distress.  Vital signs reviewed. HEENT: Conjunctiva erythematous BL with clear drainage.  No purluence.  EOMI, PERRL.  No pain with extra-ocular movments.  Nasal turbinates somewhat erythematous and enlarged.  Ears clear BL.  MMM  Oropharnyx somewhat irritated but not even moderately erythematous.  Tonsils surgically absent. Neck:  No LAD Cardiac:  Regular rate and rhythm  Pulm:  Clear to auscultation BL   Skin:  No rash noted  No results found for this or any previous visit (from the past 72 hour(s)).

## 2013-07-05 NOTE — Assessment & Plan Note (Signed)
Better on recheck FU in 2 weeks to ensure at goal.

## 2013-07-05 NOTE — Patient Instructions (Signed)
I have sent in Cefdinir as the antibiotic for you.  THis should cover both your eyes and lungs.  I have also sent in some cough capsules which should help relieve your cough.  If you start having worsening trouble breathing, worse fevers, or not feeling better come back or go to the ED.  Otherwise, let us see you back in 2 weeks to make sure you're better  It was good to meet you today.

## 2013-07-05 NOTE — Assessment & Plan Note (Signed)
Stop olapatadine.  Possibly contact dermatitis as has spread, also could have spread viral conjunctivitis between eyes (more likely). Very low liklhood bacterial, no purulence. If it is, Cefdinir will cover.  FU if worsening or no improvement.

## 2013-07-05 NOTE — Assessment & Plan Note (Signed)
Multiple comorbidities. Possible PNA although lungs clear. Will treat either way, so hold off on CXR for now. Return precautions provided.  FU previously scheduled appt in 2 weeks.

## 2013-07-06 ENCOUNTER — Telehealth: Payer: Self-pay | Admitting: Family Medicine

## 2013-07-06 ENCOUNTER — Telehealth: Payer: Self-pay | Admitting: Home Health Services

## 2013-07-06 MED ORDER — AZITHROMYCIN 250 MG PO TABS: ORAL_TABLET | ORAL | Status: DC

## 2013-07-06 NOTE — Telephone Encounter (Signed)
I have sent in the Katherine Christensen with the knowledge it might not work as well as the other prescription for her respiratory infection.  It will defintiely not help her eyes, but that is likely viral and should improve on its own within the week.

## 2013-07-06 NOTE — Telephone Encounter (Signed)
Pt called in concerned about the prescription that was sent in yesterday to treat her cold because it is part of a penicillin family.  Pt stated that she would not take this medications for fear of reaction to penicillin.  Relayed message from Dr. Mingo Amber to patient that he will send in z-pack but that this medication may not treat what the patient has and that he recommends taking the medication that was prescribed yesterday.  Pt expressed understanding.

## 2013-07-06 NOTE — Telephone Encounter (Signed)
Message copied by Alveda Reasons on Wed Jul 06, 2013 11:55 AM ------      Message from: Lorenza Cambridge      Created: Wed Jul 06, 2013 11:08 AM      Regarding: change to non-penicillin prescription       Per our conversation this morning, please prescribe medication for patients cold that is a non-penicillin. ------

## 2013-07-18 ENCOUNTER — Ambulatory Visit (INDEPENDENT_AMBULATORY_CARE_PROVIDER_SITE_OTHER): Payer: Medicare Other | Admitting: Family Medicine

## 2013-07-18 ENCOUNTER — Encounter: Payer: Self-pay | Admitting: Family Medicine

## 2013-07-18 VITALS — BP 168/88 | HR 98 | Temp 98.6°F | Ht 63.0 in | Wt 254.0 lb

## 2013-07-18 DIAGNOSIS — IMO0002 Reserved for concepts with insufficient information to code with codable children: Secondary | ICD-10-CM

## 2013-07-18 DIAGNOSIS — IMO0001 Reserved for inherently not codable concepts without codable children: Secondary | ICD-10-CM

## 2013-07-18 DIAGNOSIS — H109 Unspecified conjunctivitis: Secondary | ICD-10-CM

## 2013-07-18 DIAGNOSIS — E1165 Type 2 diabetes mellitus with hyperglycemia: Secondary | ICD-10-CM

## 2013-07-18 LAB — POCT GLYCOSYLATED HEMOGLOBIN (HGB A1C): HEMOGLOBIN A1C: 9.2

## 2013-07-18 MED ORDER — CIPROFLOXACIN HCL 0.3 % OP SOLN: OPHTHALMIC | Status: DC

## 2013-07-18 NOTE — Assessment & Plan Note (Signed)
Mild conjunctivitis - still bothersome but improving, likely allergic or viral in etiology; after recent macrolide antibiotic treatment, but in very unlikely circumstance, pt could have pseudomonal infection (does have uncontrolled DM); instructed to use anti-histamine drops for 2 more days, if no improvement then start cipro drops; given precautions for return

## 2013-07-18 NOTE — Progress Notes (Signed)
Patient ID: Katherine Christensen, female   DOB: 1940/11/14, 73 y.o.   MRN: [id number]   Subjective:    Bilateral Eye Inflammation -   Seen on 12/29, diagnosed with allergic vs  conjunctivitis and started on anti-histamine drops; seen on 1/6 dx with blepharitis and URI, given Rx of azithromycin and feels like her respiratory symptoms are improving but still have ocular symptoms including eye itching and increased non-suppurative drainage; no fever, chills or vision changes, no ocular pain  Denies history of trauma or exposure to dust or foreign body that could get into her eye; wears glasses but not contacts   Review of Systems:  See HPI     Objective:   Physical Exam: BP 168/88  Pulse 98  Temp(Src) 98.6 F (37 C) (Oral)  Ht 5\' 3"  (1.6 m)  Wt 254 lb (115.214 kg)  BMI 45.01 kg/m2  General: alert, well appearing, and in no distress and obese elderly AAF, non ill appearing Eyes: mild conjunctivitis without drainage, PERRLA, EOMI HEENT: OP clear and moist, no lymphadenopathy, no sinus tenderness      Assessment & Plan:

## 2013-07-18 NOTE — Patient Instructions (Signed)
Katherine Christensen,   It was nice to see you. Please read below.   1. Eye Inflammation - I still think that this is th results of allergies or a virus. Please use the anti-histamine eye drops for the next 48 hours. If no improvement, then start the antibiotic eye drops that I am giving you. If you develop fever, swelling around the eye or vision changes, please let me know.   2. Diabetes - Your A1C is 9.2. We really need to address this.   Come back in 1-2 weeks.   Sincerely,   Dr. Maricela Bo

## 2013-08-08 ENCOUNTER — Ambulatory Visit (INDEPENDENT_AMBULATORY_CARE_PROVIDER_SITE_OTHER): Payer: Medicare Other | Admitting: Family Medicine

## 2013-08-08 ENCOUNTER — Other Ambulatory Visit: Payer: Self-pay

## 2013-08-08 VITALS — BP 140/80 | HR 94 | Temp 98.5°F | Wt 254.4 lb

## 2013-08-08 DIAGNOSIS — IMO0001 Reserved for inherently not codable concepts without codable children: Secondary | ICD-10-CM

## 2013-08-08 DIAGNOSIS — Z1231 Encounter for screening mammogram for malignant neoplasm of breast: Secondary | ICD-10-CM

## 2013-08-08 DIAGNOSIS — IMO0002 Reserved for concepts with insufficient information to code with codable children: Secondary | ICD-10-CM

## 2013-08-08 DIAGNOSIS — E785 Hyperlipidemia, unspecified: Secondary | ICD-10-CM

## 2013-08-08 DIAGNOSIS — E1165 Type 2 diabetes mellitus with hyperglycemia: Secondary | ICD-10-CM

## 2013-08-08 MED ORDER — INSULIN PEN NEEDLE 31G X 8 MM MISC: Status: DC

## 2013-08-08 MED ORDER — ATORVASTATIN CALCIUM 40 MG PO TABS: 40.0000 mg | ORAL_TABLET | Freq: Every day | ORAL | Status: DC

## 2013-08-08 NOTE — Patient Instructions (Signed)
Dear Ms. Katherine Christensen,   Thanks for coming in.   1. Diabetes - Keep taking the Lantus. Take is right after checking your blood sugar each morning.   2. Cholesterol - You will benefit from Lipitor. I cannot make you take it but strongly recommend it. F/u in 4-6 weeks for a check of the liver.   See you soon. Write me a message in Epic with any questions.   Dr. Maricela Bo

## 2013-08-08 NOTE — Progress Notes (Signed)
Patient ID: Katherine Christensen, female   DOB: 10/14/1940, 73 y.o.   MRN: [id number]   Subjective:   Diabetes  Recent Issues: Pt with a long history of non adherence to medications including diabetes medications    Medication Compliance: Diabetes medication: No - prescribed Lantus 50 units per day but states that she forgot to take it most days during November and December; she does not have a routine for the time she checks her sugars or administers insulin; pt has never taken Victoza despite being prescribed this last year  Taking ACE-I: Yes - prescribed losartan (50 mg daily) Taking statin: No - prescribed atorvastatin but patient not taking it b/c she is "concerned about side effects," when asked she cannot state what those side effects are specifically, no hx of taking statin and no hx of statin intolerance   Behavioral: Home CBG Monitoring: Yes (135-145) Diet changes: No Exercise: No  Health Maintenance: Visual problems: No Last eye exam: 04/14/14 Last dental visit: none Foot ulcers: No    Review of Systems:  Constitutional: negative for chills, fevers and weight loss Respiratory: negative for wheezing Cardiovascular: negative for chest pressure/discomfort Gastrointestinal: negative for abdominal pain, change in bowel habits and jaundice     Objective:   Physical Exam: BP 140/80  Pulse 94  Temp(Src) 98.5 F (36.9 C) (Oral)  Wt 254 lb 6.4 oz (115.395 kg)  General: alert, well appearing, and in no distress and obese Eyes: PERRLA, EOMI Cardiovascular: RRR, no murmur Pulmonary: clear to auscultation bilaterally Abd: obese, NDNT Feet: warm, good capillary refill, nail exam normal nails without lesions and normal monofilament exam  Labs:   Diabetic Labs:  Lab Results  Component Value Date   HGBA1C 9.2 07/18/2013   HGBA1C 8.2 04/06/2013   HGBA1C 9.5 01/05/2013   Lab Results  Component Value Date   MICROALBUR 0.60 04/17/2009   LDLCALC 125* 01/14/2013   CREATININE 0.53  01/14/2013   Last microalbumin: Lab Results  Component Value Date   MICROALBUR 0.60 04/17/2009        Assessment & Plan:

## 2013-08-09 ENCOUNTER — Other Ambulatory Visit: Payer: Self-pay | Admitting: *Deleted

## 2013-08-09 DIAGNOSIS — I10 Essential (primary) hypertension: Secondary | ICD-10-CM

## 2013-08-09 MED ORDER — LOSARTAN POTASSIUM 100 MG PO TABS: 50.0000 mg | ORAL_TABLET | Freq: Every day | ORAL | Status: DC

## 2013-08-09 NOTE — Assessment & Plan Note (Signed)
A: pt with a1c > 9 due to persistent medication non adherence, additionally, the patient does not exercise or follow a diabetic diet  P:  - D/c victoza since pt does not want to take it - Cont Lantus 50 units daily since pt states that when she takes it her sugar is close to 150 - Counseled pt on behavioral techniques for checking sugar and administering insulin daily  - Encouraged exercise

## 2013-08-09 NOTE — Assessment & Plan Note (Signed)
A: pt non adherent with statin despite hx of MI and poorly controlled DM and HTN P:  - Pt counseled extensively on importance statin and possible side effects - Pt agreeable to start atorvastatin 40 mg daily - Check LFTs in 4-6 weeks

## 2013-08-11 ENCOUNTER — Encounter: Payer: Self-pay | Admitting: *Deleted

## 2013-08-11 DIAGNOSIS — I10 Essential (primary) hypertension: Secondary | ICD-10-CM

## 2013-08-31 ENCOUNTER — Encounter: Payer: Self-pay | Admitting: Sports Medicine

## 2013-08-31 ENCOUNTER — Ambulatory Visit (INDEPENDENT_AMBULATORY_CARE_PROVIDER_SITE_OTHER): Payer: Medicare Other | Admitting: Sports Medicine

## 2013-08-31 VITALS — BP 173/80 | Ht 63.0 in | Wt 250.0 lb

## 2013-08-31 DIAGNOSIS — M25511 Pain in right shoulder: Secondary | ICD-10-CM

## 2013-08-31 DIAGNOSIS — M25519 Pain in unspecified shoulder: Secondary | ICD-10-CM

## 2013-08-31 NOTE — Progress Notes (Signed)
   Subjective:    Patient ID: Katherine Christensen, female    DOB: 1940/11/15, 73 y.o.   MRN: [id number]  HPI  Ms. Binz presents complaining of a 2 day history of right shoulder stiffness and pain. She first noticed her Right shoulder getting stiff on 3/2 but was not having pain. She notes that while driving she was unable to turn the wheel because she was not able to move her right arm. Then last night she woke up from sleep with severe shoulder pain. She tried Biofreeze and a product called Stop Pain with significant improvement. She did not take any oral pain medication. However, this morning she was unable to move her arm. She is able to gain range of motion passively if she uses her left arm to lift her right. She does have pain on the outside edge of the shoulder when she tries to lift her arm up.   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 At the time she saw PT, then it went away on its own with time. She denies any injuries to her shoulder recently but has been caring for her grandchild who is 7 months old and weighs approximately 22 lbs.    Review of Systems Negative except for HPI     Objective:   Physical Exam  Shoulder Inspection: No erythema or muscle atrophy. Right and left equal bilaterally Palpation: Muscle tone equal bilaterally. Not warm to touch. RIGHT tender to palpation on lateral shoulder and subacromial space at rotator cuff insertion point.  ROM: Left with normal ROM for Flexion, extension, external rotation. Some diminished internal rotation with hand reaching approximately T12-L1. RIGHT Active ROM diminished with flexion 20 degrees, abduction 10 degrees, normal external rotation, internal rotation to L1-L2. RIGHT PASSIVE ROM is improved with Flexion to 150 degrees, Abduction to 90 degrees. Pain with flexion and abduction Special tests: Empty can Impingement test illicits increased pain on RIGHT Neurovascularly intact  distally.      Assessment & Plan:   73 yo with history of frozen shoulder complaining of Right shoulder stiffness and pain  1. Rotator Cuff Strain: Given strain of carrying baby and presentation with passive ROM and pain at rotator cuff insertion points.  --Subacromial injection of 40 mg Methylprednisolone acetate --Sling provided to use only when in severe pain and she needs to rest--not to be used regularly --Continue to use Tramadol PRN for pain--pt has old prescription of medication at home, contact clinic if out or needs a refill.  --Follow-up in one week   Consent obtained and verified. Time-out conducted. Noted no overlying erythema, induration, or other signs of local infection. Skin prepped in a sterile fashion. Topical analgesic spray: Ethyl chloride. Joint: right subacromial space Needle: 25g 1.5 inch Completed without difficulty. Meds: 1cc (40mg ) depomedrol, 3cc 1% xylocaine  Advised to call if fevers/chills, erythema, induration, drainage, or persistent bleeding.   Seen with Jacqulyn Liner, MS4

## 2013-09-01 ENCOUNTER — Ambulatory Visit: Payer: Medicare Other

## 2013-09-05 ENCOUNTER — Ambulatory Visit (INDEPENDENT_AMBULATORY_CARE_PROVIDER_SITE_OTHER): Payer: Medicare Other | Admitting: Sports Medicine

## 2013-09-05 ENCOUNTER — Encounter: Payer: Self-pay | Admitting: Sports Medicine

## 2013-09-05 VITALS — BP 156/81

## 2013-09-05 DIAGNOSIS — M25511 Pain in right shoulder: Secondary | ICD-10-CM

## 2013-09-05 DIAGNOSIS — M25519 Pain in unspecified shoulder: Secondary | ICD-10-CM

## 2013-09-05 NOTE — Progress Notes (Addendum)
   Subjective:    Patient ID: Katherine Christensen, female    DOB: [birth year], 73 y.o.   MRN: [id number]  HPI  Katherine Christensen presents for 1 week follow up of right shoulder pain after receiving a methylprednisolone 40 mg injection. She is 50-60% better with pain. She is able to continue to take care of her grandson without significant pain. Was even able to do a lot of tasks with her arms setting up for work without even thinking about shoulder pain. ROM is also improved in the right shoulder. She has not required any tramadol or other other medication. She used the arm sling one time to rest her shoulder. She continues to use Stop Pain which is a Methanol roll-on and ice. She is not complaining of pain on the lateral arm.   Review of Systems negative apart from HPI     Objective:   Physical Exam  Shoulder  Inspection: No erythema or muscle atrophy. Right and left equal bilaterally  Palpation: Muscle tone equal bilaterally. Not warm to touch. RIGHT tender to palpation on lateral upper arm ROM: Left with normal ROM for Flexion, extension, external rotation, internal rotation.  RIGHT Active ROM diminished with flexion 100 degrees, abduction 85 degrees, normal external rotation, internal rotation. RIGHT PASSIVE ROM is improved with Flexion to 160 degrees, Abduction to 90 degrees. Mild pain with flexion and abduction located on lateral arm. Supraspinatus strength 4/5.   Neurovascularly intact distally.     Assessment & Plan:  73 yo follow-up on RIGHT shoulder pain after steroid injection.  1. Right Shoulder likely secondary to Rotator Cuff tear/strain: Improvement following steroid injection last week.  --Follow-up 3 weeks to see if back to baseline, if not improved will consider imaging at that time --Continue using Stop Pain and ice as needed  --Tramadol and Sling PRN   Seen with Jacqulyn Liner, MS4

## 2013-09-08 ENCOUNTER — Ambulatory Visit (INDEPENDENT_AMBULATORY_CARE_PROVIDER_SITE_OTHER): Payer: Medicare Other | Admitting: Family Medicine

## 2013-09-08 ENCOUNTER — Encounter: Payer: Self-pay | Admitting: Family Medicine

## 2013-09-08 VITALS — BP 145/72 | HR 71 | Ht 63.0 in | Wt 252.0 lb

## 2013-09-08 DIAGNOSIS — IMO0001 Reserved for inherently not codable concepts without codable children: Secondary | ICD-10-CM

## 2013-09-08 DIAGNOSIS — E1165 Type 2 diabetes mellitus with hyperglycemia: Secondary | ICD-10-CM

## 2013-09-08 DIAGNOSIS — E785 Hyperlipidemia, unspecified: Secondary | ICD-10-CM

## 2013-09-08 DIAGNOSIS — IMO0002 Reserved for concepts with insufficient information to code with codable children: Secondary | ICD-10-CM

## 2013-09-08 NOTE — Patient Instructions (Signed)
Katherine Christensen to see you. I think that you are doing great with the insulin use. Do your best with the lipitor. If you take it for about 4 weeks, then let me know so we can check your liver function test.   Come back in 4-6 weeks.   Sincerely,   Dr. Maricela Bo

## 2013-09-08 NOTE — Progress Notes (Signed)
   Subjective:    Patient ID: Katherine Christensen, female    DOB: December 18, 1940, 73 y.o.   MRN: [id number]  HPI  73 year old f with poorly controlled DM type 2, HTN, hx of MI, and obesity who presents for follow up.   Hyperlipidemia - Pt prescribed atorvastatin numerous months ago, but never started it due to concerns. Concerns include muscle soreness. She is worried that if she gets sore, then she will not know if she has another heart attack. She notes that several of her friends take the medication and have no complications.   DM - pt more compliant with insulin and taking CBG more often, highest is 176, did have 1 low hypoglycemic episode (did not test) which resolved with soda; range yesterday as 111-130; pt attributes the improvement in CBG to improved in technique of injection   Current Outpatient Prescriptions on File Prior to Visit  Medication Sig Dispense Refill  . acetaminophen (TYLENOL) 500 MG tablet Take 500 mg by mouth as needed.       Marland Kitchen albuterol (PROVENTIL HFA;VENTOLIN HFA) 108 (90 BASE) MCG/ACT inhaler Inhale 1-2 puffs into the lungs every 6 (six) hours as needed for wheezing.  1 Inhaler  0  . atorvastatin (LIPITOR) 40 MG tablet Take 1 tablet (40 mg total) by mouth daily.  30 tablet  5  . cetirizine (ZYRTEC) 10 MG tablet Take 1 tablet (10 mg total) by mouth daily.  30 tablet  11  . Cholecalciferol (VITAMIN D3) 50000 UNITS CAPS Take 50,000 Units by mouth once a week.  60 capsule  0  . glucose blood (ACCU-CHEK AVIVA PLUS) test strip Use as instructed  100 each  10  . GNP LANCETS MICRO THIN 33G MISC TEST BLOOD GLUCOSE TWICE DAILY  100 each  3  . ibuprofen (ADVIL,MOTRIN) 200 MG tablet Take 200 mg by mouth as needed for pain (for headaches).      . Insulin Glargine (LANTUS SOLOSTAR) 100 UNIT/ML SOPN Inject 47 Units into the skin daily.  5 pen  PRN  . Insulin Pen Needle (B-D ULTRAFINE III SHORT PEN) 31G X 8 MM MISC Dispense amount sufficient for one daily shot.  100 each  PRN  . losartan  (COZAAR) 100 MG tablet Take 0.5 tablets (50 mg total) by mouth daily.  30 tablet  11  . Olopatadine HCl 0.2 % SOLN 1 drop each affected eye once daily  2.5 mL  1  . traMADol (ULTRAM) 50 MG tablet Take 1 tablet (50 mg total) by mouth 2 (two) times daily as needed for pain.  60 tablet  0   No current facility-administered medications on file prior to visit.     Review of Systems No chest pain, SOB, swelling of extremities     Objective:   Physical Exam BP 145/72  Pulse 71  Ht 5\' 3"  (1.6 m)  Wt 252 lb (114.306 kg)  BMI 44.65 kg/m2  General: alert, well appearing, and in no distress and obese  Cardiovascular: RRR, no murmur  Pulmonary: clear to auscultation bilaterally  Extremities: no edema       Assessment & Plan:

## 2013-09-09 ENCOUNTER — Ambulatory Visit: Payer: Medicare Other

## 2013-09-11 NOTE — Assessment & Plan Note (Signed)
A: according to pt, CBG markedly improved with regular use of insulin and proper technique P: cont current dose,check A1C in 2 months

## 2013-09-11 NOTE — Assessment & Plan Note (Signed)
A: untreated HLD due to patient concern about medication side effects P: I counseled pt regarding the potential side effects and how I would monitor them. I also addressed potential benefits that she acknowledged. Cont to recommend medication and f/u in 4 weeks if she actually starts the Lipitor.

## 2013-09-26 ENCOUNTER — Encounter: Payer: Self-pay | Admitting: Sports Medicine

## 2013-09-26 ENCOUNTER — Ambulatory Visit (INDEPENDENT_AMBULATORY_CARE_PROVIDER_SITE_OTHER): Payer: 59 | Admitting: Sports Medicine

## 2013-09-26 VITALS — BP 165/72 | Ht 63.0 in | Wt 250.0 lb

## 2013-09-26 DIAGNOSIS — IMO0002 Reserved for concepts with insufficient information to code with codable children: Secondary | ICD-10-CM

## 2013-09-26 DIAGNOSIS — M7050 Other bursitis of knee, unspecified knee: Secondary | ICD-10-CM

## 2013-09-26 NOTE — Progress Notes (Signed)
   Subjective:    Patient ID: Katherine Christensen, female    DOB: 11/09/1940, 73 y.o.   MRN: [id number]  HPI Ms. Mccranie comes in today for followup on right shoulder pain. Right shoulder pain has resolved. Main complaint today is right knee pain. 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 She's complaining of a tight feeling around the knee. She's noticed swelling along the medial knee as well. Symptoms have been present for the past week or so. It is worse at night. She denies pain or swelling distal to the knee.    Review of Systems     Objective:   Physical Exam Well-developed, well-nourished. No acute distress.  Right shoulder: Full range of motion. No tenderness to palpation. Good strength.  Right knee: There is moderate swelling over the pes anserine bursa. Tenderness to palpation here as well. Range of motion of the knee is 0-120. No effusion. Negative Homans. No calf tenderness. Neurovascularly intact distally. Walking with a slight limp.       Assessment & Plan:  1. Improved right shoulder pain secondary to rotator cuff strain 2. Right knee pain secondary to pes anserine bursitis  Ace wrap for compression. Ice 2-3 times daily. We discussed oral anti-inflammatories but the patient does not like taking these unless absolutely necessary. Resume activity as tolerated and followup if symptoms persist or worsen.

## 2013-10-11 ENCOUNTER — Ambulatory Visit (INDEPENDENT_AMBULATORY_CARE_PROVIDER_SITE_OTHER): Payer: Medicare Other | Admitting: Family Medicine

## 2013-10-11 VITALS — BP 134/78 | HR 101 | Temp 99.3°F | Ht 63.0 in | Wt 260.0 lb

## 2013-10-11 DIAGNOSIS — R300 Dysuria: Secondary | ICD-10-CM

## 2013-10-11 DIAGNOSIS — R1031 Right lower quadrant pain: Secondary | ICD-10-CM

## 2013-10-11 LAB — POCT URINALYSIS DIPSTICK
BILIRUBIN UA: NEGATIVE
Glucose, UA: 500
Ketones, UA: NEGATIVE
LEUKOCYTES UA: NEGATIVE
NITRITE UA: NEGATIVE
PH UA: 6
PROTEIN UA: NEGATIVE
RBC UA: NEGATIVE
Spec Grav, UA: 1.015
Urobilinogen, UA: 0.2

## 2013-10-11 NOTE — Progress Notes (Signed)
Family Medicine Office Visit Note   Subjective:   Patient ID: Katherine Christensen, female  DOB: 05-09-1941, 73 y.o.. MRN: [id number]   Pt that comes today for same day appointment reporting "her right sides is falling apart". She complains of abdominal pain on right lower quadrant that radiated to her right flank. Intensity is mild to moderated, pain is aching in nature. Pt has not taken any medication for it and reports worse with movement. Denies dysuria, frequency, nausea, vomiting, diarrhea, fever or chills. No other systemic symptoms. No hx of kidney stone in the past but concerned since she drinks a lot of "te"   Pt has hx of complete hysterectomy and bilateral oothectomy per her report but unsure if appendix was also removed with surgery.    Review of Systems:  Per HPI  Objective:   Physical Exam: Gen: obese, NAD HEENT: Moist mucous membranes  CV: Regular rate and rhythm, no murmurs rubs or gallops PULM: Clear to auscultation bilaterally. No wheezes/rales/rhonchi ABD: Soft, mildly  Tender without rebound tenderness or guarding. Normal bowel sounds. No CVA tenderness.  EXT: No edema Neuro: Alert and oriented x3. No focalization  Assessment & Plan:

## 2013-10-11 NOTE — Patient Instructions (Signed)
It has been a pleasure to see you today. The urine is negative for infection or blood. Please drink plenty of water and use tylenol Q6h as needed for pain, if you develop fever, nausea, vomiting or other urinary symptoms please get evaluated. F/u with your primary doctor as scheduled

## 2013-10-12 NOTE — Assessment & Plan Note (Signed)
Unspecific findings. No signs of acute abdomen. UA only positive for glucose in a DM pt. No other signs of UTI, Kidney stone is a possibility.  No GI symptoms that could point towards GI etiology.  Discussed signs of worsening condition that should prompt re-evaluation. Increase fluid intake and tylenol PRN. Lab work with consideration for U/S will be indicated if pain increases, or other symptoms appear.

## 2013-10-16 ENCOUNTER — Other Ambulatory Visit: Payer: Self-pay | Admitting: Family Medicine

## 2013-10-17 ENCOUNTER — Ambulatory Visit
Admission: RE | Admit: 2013-10-17 | Discharge: 2013-10-17 | Disposition: A | Discharge status: Home / Self Care | Payer: Self-pay | Source: Ambulatory Visit

## 2013-10-17 DIAGNOSIS — Z1231 Encounter for screening mammogram for malignant neoplasm of breast: Secondary | ICD-10-CM

## 2013-10-20 ENCOUNTER — Ambulatory Visit (INDEPENDENT_AMBULATORY_CARE_PROVIDER_SITE_OTHER): Payer: Medicare Other | Admitting: Family Medicine

## 2013-10-20 ENCOUNTER — Ambulatory Visit (INDEPENDENT_AMBULATORY_CARE_PROVIDER_SITE_OTHER): Payer: Medicare Other | Admitting: Sports Medicine

## 2013-10-20 ENCOUNTER — Encounter: Payer: Self-pay | Admitting: Family Medicine

## 2013-10-20 VITALS — BP 157/75 | HR 81 | Ht 63.0 in | Wt 257.0 lb

## 2013-10-20 VITALS — BP 160/80 | Ht 62.0 in | Wt 250.0 lb

## 2013-10-20 DIAGNOSIS — E785 Hyperlipidemia, unspecified: Secondary | ICD-10-CM

## 2013-10-20 DIAGNOSIS — IMO0002 Reserved for concepts with insufficient information to code with codable children: Secondary | ICD-10-CM | POA: Diagnosis not present

## 2013-10-20 DIAGNOSIS — M179 Osteoarthritis of knee, unspecified: Secondary | ICD-10-CM

## 2013-10-20 DIAGNOSIS — M1710 Unilateral primary osteoarthritis, unspecified knee: Secondary | ICD-10-CM | POA: Diagnosis not present

## 2013-10-20 DIAGNOSIS — R1031 Right lower quadrant pain: Secondary | ICD-10-CM

## 2013-10-20 MED ORDER — METHYLPREDNISOLONE ACETATE 40 MG/ML IJ SUSP
40.0000 mg | Freq: Once | INTRAMUSCULAR | Status: AC
  Administered 2013-10-20: 40 mg via INTRA_ARTICULAR

## 2013-10-20 NOTE — Patient Instructions (Signed)
Dear Ms. Cotrell,   Thank you for coming to clinic today. Please read below regarding the issues that we discussed.   1. Back and abdominal pain - I'm not sure if this is due to the kidney or back, but most importantly you are feeling better. No need to do anything differently today.   2. Diabetes - Continue your current medication. Please follow up in May for check up on your diabetes.   Please follow up in clinic in 2-4 weeks. Please call earlier if you have any questions or concerns.   Sincerely,   Dr. Maricela Bo

## 2013-10-20 NOTE — Progress Notes (Signed)
   Subjective:    Patient ID: Katherine Christensen, female    DOB: January 05, 1941, 73 y.o.   MRN: [id number]  HPI  73 year old f with poorly controlled DM type 2, HTN, hx of MI, and obesity who presents for follow up of right lower quadrant pain. She was seen on for this problem on 10/11/13.   RLQ Pain - Improved with taking OTC arthritis pain 4 days ago, pt denies nausea, vomiting, diarrhea, fever; no change in appetite  Hypercholesterolemia - Pt continues not to take Lipitor despite extensive conversations about risks and benefits in setting of post-MI  Social - Patient planning to move to [place], [place] in July 2015  Review of Systems Positive for chronic right knee pain Negative for chest pain, SOB, leg edema    Objective:   Physical Exam  BP 157/75  Pulse 81  Ht 5\' 3"  (1.6 m)  Wt 257 lb (116.574 kg)  BMI 45.54 kg/m2  Gen: obese, AAF, non ill appearing RLE: tenderness of right medial knee Abd: soft, NDNT     Assessment & Plan:

## 2013-10-21 NOTE — Progress Notes (Signed)
   Subjective:    Patient ID: Katherine Christensen, female    DOB: 1940-12-13, 73 y.o.   MRN: [id number]  HPI Patient comes in today requesting a repeat cortisone injection into her right knee. She has a well-documented history of right knee DJD. Last injection was many months ago and provide her with good symptom relief up until recently. Pain has now returned and is identical in nature to what she experienced previously. No trauma. Intermittent swelling. She is moving to [place] in June.    Review of Systems     Objective:   Physical Exam Well-developed, obese. No acute distress. Awake alert and oriented x3. Vital signs are reviewed.  Right knee: Patient demonstrates about a 5 extension lag. Flexion to 110. Trace effusion. Diffuse tenderness to palpation. Pain but no popping with McMurray's. Knee is grossly stable to ligamentous exam. Neurovascularly intact distally.       Assessment & Plan:  Returning right knee pain secondary to advanced DJD  Patient's right knee was reinjected today with cortisone. An anterior medial approach was utilized. Patient tolerated this without difficulty. She understands that definitive treatment is a total knee arthroplasty and since she is moving to [place] in June she will look for an orthopedic surgeon there. I've encouraged her to contact me if she needs help finding a Media planner. Followup when necessary.  Consent obtained and verified. Time-out conducted. Noted no overlying erythema, induration, or other signs of local infection. Skin prepped in a sterile fashion. Topical analgesic spray: Ethyl chloride. Joint: right knee Needle: 25g 1.5 inch Completed without difficulty. Meds: 3cc 1% xylocaine, 1cc (40mg ) depomedrol  Advised to call if fevers/chills, erythema, induration, drainage, or persistent bleeding.

## 2013-10-21 NOTE — Assessment & Plan Note (Signed)
Resolved, unclear etiology

## 2013-10-21 NOTE — Assessment & Plan Note (Signed)
Pt continues not to take lipitor despite agreeing to do it several times. Non adherence to medical regimen continues to be an issue for her with all medications.

## 2013-10-24 ENCOUNTER — Ambulatory Visit: Payer: Medicare Other | Admitting: Sports Medicine

## 2013-10-30 ENCOUNTER — Other Ambulatory Visit: Payer: Self-pay | Admitting: Family Medicine

## 2013-11-23 ENCOUNTER — Encounter: Payer: Self-pay | Admitting: Sports Medicine

## 2013-11-23 ENCOUNTER — Ambulatory Visit (INDEPENDENT_AMBULATORY_CARE_PROVIDER_SITE_OTHER): Payer: Medicare Other | Admitting: Sports Medicine

## 2013-11-23 VITALS — BP 174/78 | Ht 62.0 in | Wt 250.0 lb

## 2013-11-23 DIAGNOSIS — IMO0002 Reserved for concepts with insufficient information to code with codable children: Secondary | ICD-10-CM

## 2013-11-23 DIAGNOSIS — M1710 Unilateral primary osteoarthritis, unspecified knee: Secondary | ICD-10-CM

## 2013-11-23 DIAGNOSIS — M179 Osteoarthritis of knee, unspecified: Secondary | ICD-10-CM

## 2013-11-23 MED ORDER — TRAMADOL HCL 50 MG PO TABS: 50.0000 mg | ORAL_TABLET | Freq: Four times a day (QID) | ORAL | Status: DC | PRN

## 2013-11-23 NOTE — Progress Notes (Signed)
   Subjective:    Patient ID: Katherine Christensen, female    DOB: 15-Nov-1940, 73 y.o.   MRN: [id number]  HPI Patient comes in today complaining of returning right pain. 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 She was last seen in the office back in April. Cortisone injection was administered and this did help with her pain. Her current symptoms are a little different than what she has previously experienced. She is having pain and swelling primarily in the posterior aspect of her knee. It started after she was doing quite a bit of moving. She is in the process of moving from here to [place]. She denies any recent trauma. She takes an over-the-counter medication which she gets at [facility] which does seem to help her but she is asking for something a little stronger take as needed.    Review of Systems     Objective:   Physical Exam Obese. No acute distress.  Right knee: Extension lag of about 3. Flexion to 100. 1+ boggy synovitis. 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 Knee is stable to ligamentous exam. Pain but no popping with McMurray's. Neurovascular intact distally.       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 It's a little too soon to repeat her cortisone injection but I did tell her that I would be happy to reinject his knee in about a month if needed (prior to her moving to [place]). She understands that definitive treatment is a total knee arthroplasty and she will look into this once she gets settled in [place].

## 2013-11-25 ENCOUNTER — Encounter: Payer: Self-pay | Admitting: Family Medicine

## 2013-11-25 NOTE — Progress Notes (Unsigned)
Pt dropped off paperwork to be filled out regarding diabetic shoes.

## 2013-11-28 NOTE — Progress Notes (Unsigned)
Patient ID: Katherine Christensen, female   DOB: [birth year], 73 y.o.   MRN: [id number] Form put in PCP's box .[name]

## 2013-11-28 NOTE — Progress Notes (Unsigned)
Patient ID: Katherine Christensen, female   DOB: Jul 13, 1940, 73 y.o.   MRN: [id number] Pt needs appointment to address her diabetes and see if she is eligible for diabetic shoes.

## 2013-12-07 ENCOUNTER — Encounter: Payer: Self-pay | Admitting: Family Medicine

## 2013-12-09 ENCOUNTER — Encounter: Payer: Self-pay | Admitting: Family Medicine

## 2013-12-09 NOTE — Progress Notes (Signed)
Patient stopped by to let us know that she has lost her meter and that she needs a new prescription called in to [facility] on [address].

## 2013-12-11 ENCOUNTER — Other Ambulatory Visit: Payer: Self-pay | Admitting: Family Medicine

## 2013-12-11 DIAGNOSIS — E1165 Type 2 diabetes mellitus with hyperglycemia: Principal | ICD-10-CM

## 2013-12-11 DIAGNOSIS — IMO0001 Reserved for inherently not codable concepts without codable children: Secondary | ICD-10-CM

## 2013-12-11 MED ORDER — ACCU-CHEK NANO SMARTVIEW W/DEVICE KIT: 1.0000 | PACK | Freq: Three times a day (TID) | Status: DC

## 2013-12-11 NOTE — Progress Notes (Signed)
Patient ID: Katherine Christensen, female   DOB: [birth year], 74 y.o.   MRN: [id number]  Rx for meter to be sent electronically.

## 2013-12-14 ENCOUNTER — Telehealth: Payer: Self-pay | Admitting: Family Medicine

## 2013-12-14 DIAGNOSIS — E1165 Type 2 diabetes mellitus with hyperglycemia: Principal | ICD-10-CM

## 2013-12-14 DIAGNOSIS — IMO0001 Reserved for inherently not codable concepts without codable children: Secondary | ICD-10-CM

## 2013-12-14 NOTE — Telephone Encounter (Signed)
Katherine Christensen need to have the rx for replacement meter sent to [facility] on [address] instead.  [name] couldn't replace it.  Pt is in [place] at this moment.  So please send and inform her that it has been done.  PS.  Also wanted to remind you that she needed her diabetic shoes, so please send in that rx as well.

## 2013-12-19 MED ORDER — ACCU-CHEK FASTCLIX LANCETS MISC: 1.0000 | Freq: Three times a day (TID) | Status: AC

## 2013-12-19 MED ORDER — GLUCOSE BLOOD VI STRP: 1.0000 | ORAL_STRIP | Freq: Three times a day (TID) | Status: DC

## 2013-12-19 MED ORDER — ACCU-CHEK NANO SMARTVIEW W/DEVICE KIT
1.0000 | PACK | Freq: Three times a day (TID) | Status: AC
Start: 2013-12-19 — End: ?

## 2013-12-19 NOTE — Telephone Encounter (Signed)
Called and informed patient that the Rx was sent to her pharmacy for her replacement meter, but she will need an office visit to evaluate her for diabetic shoes. Ms Trochez said she will call back to schedule an appointment.Busick, Kevin Fenton

## 2013-12-19 NOTE — Telephone Encounter (Signed)
Refill sent to [facility] on [address]. Patient will needs an appointment for evaluation for diabetic shoes. There is very specific documentation needed for a patient to qualify.

## 2013-12-27 ENCOUNTER — Ambulatory Visit (INDEPENDENT_AMBULATORY_CARE_PROVIDER_SITE_OTHER): Payer: Medicare Other | Admitting: Family Medicine

## 2013-12-27 ENCOUNTER — Encounter: Payer: Self-pay | Admitting: Family Medicine

## 2013-12-27 VITALS — BP 166/83 | HR 86 | Temp 98.0°F | Wt 254.0 lb

## 2013-12-27 DIAGNOSIS — IMO0001 Reserved for inherently not codable concepts without codable children: Secondary | ICD-10-CM

## 2013-12-27 DIAGNOSIS — IMO0002 Reserved for concepts with insufficient information to code with codable children: Secondary | ICD-10-CM

## 2013-12-27 DIAGNOSIS — E1165 Type 2 diabetes mellitus with hyperglycemia: Secondary | ICD-10-CM

## 2013-12-27 LAB — POCT GLYCOSYLATED HEMOGLOBIN (HGB A1C): Hemoglobin A1C: 7.9

## 2013-12-27 MED ORDER — GLUCOSE BLOOD VI STRP: 1.0000 | ORAL_STRIP | Freq: Three times a day (TID) | Status: DC

## 2013-12-27 NOTE — Patient Instructions (Signed)
**Note De-Identified Cloee Katherine Christensen Obfuscation** Ms. Mccubbins,   I am going to miss you as a patient and wish you the best of luck in [place]. You not need any diabetic shoes at this time. Please continue to take good care of yourself.   Sincerely,   Dr. Maricela Bo

## 2013-12-27 NOTE — Assessment & Plan Note (Signed)
Pt does not qualify for diabetic shoes. Given refills for test strips. Pt told of A1C and will follow up with new MD in [place] when able.

## 2013-12-27 NOTE — Progress Notes (Signed)
   Subjective:    Patient ID: Katherine Christensen, female    DOB: 1941-01-18, 73 y.o.   MRN: [id number]  HPI  73 year old F who presents for evaluation for diabetic shoes. Pt has poorly controlled DM due to non-adherence. She is taking her insulin intermittently, but needs a refill on her supplies. She denies hypoglycemia. Denies hx of ulcer, amputation, callous, neuropathy or foot deformity.   Social - Pt planning to move to [place], [place] in 2 weeks   Current Outpatient Prescriptions on File Prior to Visit  Medication Sig Dispense Refill  . ACCU-CHEK FASTCLIX LANCETS MISC 1 each by Does not apply route 3 (three) times daily. Check sugar 10 x daily  306 each  3  . acetaminophen (TYLENOL) 500 MG tablet Take 500 mg by mouth as needed.       Marland Kitchen albuterol (PROVENTIL HFA;VENTOLIN HFA) 108 (90 BASE) MCG/ACT inhaler Inhale 1-2 puffs into the lungs every 6 (six) hours as needed for wheezing.  1 Inhaler  0  . atorvastatin (LIPITOR) 40 MG tablet Take 1 tablet (40 mg total) by mouth daily.  30 tablet  5  . Blood Glucose Monitoring Suppl (ACCU-CHEK NANO SMARTVIEW) W/DEVICE KIT 1 kit by Does not apply route 3 (three) times daily.  1 kit  0  . cetirizine (ZYRTEC) 10 MG tablet Take 1 tablet (10 mg total) by mouth daily.  30 tablet  11  . Cholecalciferol (VITAMIN D3) 50000 UNITS CAPS Take 50,000 Units by mouth once a week.  60 capsule  0  . glucose blood (ACCU-CHEK AVIVA PLUS) test strip 1 each by Other route 3 (three) times daily. Use as instructed  100 each  2  . ibuprofen (ADVIL,MOTRIN) 200 MG tablet Take 200 mg by mouth as needed for pain (for headaches).      . Insulin Pen Needle (B-D ULTRAFINE III SHORT PEN) 31G X 8 MM MISC Dispense amount sufficient for one daily shot.  100 each  PRN  . LANTUS SOLOSTAR 100 UNIT/ML Solostar Pen INJECT (47UNITS) INTO THE SKIN DAILY.  15 mL  PRN  . losartan (COZAAR) 100 MG tablet Take 0.5 tablets (50 mg total) by mouth daily.  30 tablet  11  . Olopatadine HCl 0.2 % SOLN 1 drop  each affected eye once daily  2.5 mL  1  . traMADol (ULTRAM) 50 MG tablet Take 1 tablet (50 mg total) by mouth 2 (two) times daily as needed for pain.  60 tablet  0  . traMADol (ULTRAM) 50 MG tablet Take 1 tablet (50 mg total) by mouth every 6 (six) hours as needed.  50 tablet  2   No current facility-administered medications on file prior to visit.     Review of Systems See HPI    Objective:   Physical Exam BP 166/83  Pulse 86  Temp(Src) 98 F (36.7 C) (Oral)  Wt 254 lb (115.214 kg)  Gen: obese, elderly AAF, pleasant Lungs: normal WOB Feet: no ulcers or callouses, 2+ DP pulses, no anatomic deformity Neuro negative monofilament testing to feet bilaterally      Assessment & Plan:

## 2014-01-10 ENCOUNTER — Telehealth: Payer: Self-pay | Admitting: Family Medicine

## 2014-01-10 DIAGNOSIS — IMO0002 Reserved for concepts with insufficient information to code with codable children: Secondary | ICD-10-CM

## 2014-01-10 DIAGNOSIS — E1165 Type 2 diabetes mellitus with hyperglycemia: Secondary | ICD-10-CM

## 2014-01-10 NOTE — Telephone Encounter (Signed)
Katherine Christensen have moved back to [place] and is in need of her Insulin Pen Needles (B-D Ultrafine III Short Pen) 31G x 8MM for her Lantus Solstar Pen.  She hasn't had her injection since Sunday.  Can call rx to [facility] at [address].  Ph# [phone]

## 2014-01-11 ENCOUNTER — Encounter: Payer: Self-pay | Admitting: Family Medicine

## 2014-01-11 DIAGNOSIS — IMO0002 Reserved for concepts with insufficient information to code with codable children: Secondary | ICD-10-CM

## 2014-01-11 DIAGNOSIS — E1165 Type 2 diabetes mellitus with hyperglycemia: Secondary | ICD-10-CM

## 2014-01-11 MED ORDER — INSULIN PEN NEEDLE 31G X 8 MM MISC: Status: DC

## 2014-01-11 NOTE — Telephone Encounter (Signed)
Rx for Insulin pen needles sent via e-Rx to [facility] [place], MD).  Called and informed patient. Patient states she will be transferring care to MD and will sign ROI to have records sent to new provider.  Burna Forts, BSN, RN-BC

## 2014-01-18 ENCOUNTER — Other Ambulatory Visit: Payer: Self-pay | Admitting: *Deleted

## 2014-01-18 DIAGNOSIS — E1165 Type 2 diabetes mellitus with hyperglycemia: Principal | ICD-10-CM

## 2014-01-18 DIAGNOSIS — IMO0001 Reserved for inherently not codable concepts without codable children: Secondary | ICD-10-CM

## 2014-01-19 MED ORDER — GLUCOSE BLOOD VI STRP: 1.0000 | ORAL_STRIP | Freq: Three times a day (TID) | Status: AC

## 2014-06-08 ENCOUNTER — Emergency Department (INDEPENDENT_AMBULATORY_CARE_PROVIDER_SITE_OTHER)
Admission: EM | Admit: 2014-06-08 | Discharge: 2014-06-08 | Disposition: A | Discharge status: Home / Self Care | Payer: 59 | Source: Home / Self Care | Attending: Family Medicine | Admitting: Family Medicine

## 2014-06-08 ENCOUNTER — Encounter (HOSPITAL_COMMUNITY): Payer: Self-pay | Admitting: Emergency Medicine

## 2014-06-08 DIAGNOSIS — M174 Other bilateral secondary osteoarthritis of knee: Secondary | ICD-10-CM | POA: Diagnosis not present

## 2014-06-08 DIAGNOSIS — H109 Unspecified conjunctivitis: Secondary | ICD-10-CM | POA: Diagnosis not present

## 2014-06-08 MED ORDER — ERYTHROMYCIN 5 MG/GM OP OINT: TOPICAL_OINTMENT | OPHTHALMIC | Status: DC

## 2014-06-08 NOTE — ED Notes (Addendum)
C/o eye redness, drainage.  Noticed yesterday.  Patient denies feeling bad in general, denies cough, cold, runny nose.  Reports knees are stiff from riding on train 9 hours yesterday.  Patient has documented knee issues

## 2014-06-08 NOTE — Discharge Instructions (Signed)
Conjunctivitis Conjunctivitis is commonly called "pink eye." Conjunctivitis can be caused by bacterial or viral infection, allergies, or injuries. There is usually redness of the lining of the eye, itching, discomfort, and sometimes discharge. There may be deposits of matter along the eyelids. A viral infection usually causes a watery discharge, while a bacterial infection causes a yellowish, thick discharge. Pink eye is very contagious and spreads by direct contact. You may be given antibiotic eyedrops as part of your treatment. Before using your eye medicine, remove all drainage from the eye by washing gently with warm water and cotton balls. Continue to use the medication until you have awakened 2 mornings in a row without discharge from the eye. Do not rub your eye. This increases the irritation and helps spread infection. Use separate towels from other household members. Wash your hands with soap and water before and after touching your eyes. Use cold compresses to reduce pain and sunglasses to relieve irritation from light. Do not wear contact lenses or wear eye makeup until the infection is gone. SEEK MEDICAL CARE IF:   Your symptoms are not better after 3 days of treatment.  You have increased pain or trouble seeing.  The outer eyelids become very red or swollen. Document Released: 07/24/2004 Document Revised: 09/08/2011 Document Reviewed: 06/16/2005 [facility] Patient Information 2015 [place], [place]. This information is not intended to replace advice given to you by your health care provider. Make sure you discuss any questions you have with your health care provider.  Osteoarthritis Osteoarthritis is a disease that causes soreness and inflammation of a joint. It occurs when the cartilage at the affected joint wears down. Cartilage acts as a cushion, covering the ends of bones where they meet to form a joint. Osteoarthritis is the most common form of arthritis. It often occurs in older people.  The joints affected most often by this condition include those in the:  Ends of the fingers.  Thumbs.  Neck.  Lower back.  Knees.  Hips. CAUSES  Over time, the cartilage that covers the ends of bones begins to wear away. This causes bone to rub on bone, producing pain and stiffness in the affected joints.  RISK FACTORS Certain factors can increase your chances of having osteoarthritis, including:  Older age.  Excessive body weight.  Overuse of joints.  Previous joint injury. SIGNS AND SYMPTOMS   Pain, swelling, and stiffness in the joint.  Over time, the joint may lose its normal shape.  Small deposits of bone (osteophytes) may grow on the edges of the joint.  Bits of bone or cartilage can break off and float inside the joint space. This may cause more pain and damage. DIAGNOSIS  Your health care provider will do a physical exam and ask about your symptoms. Various tests may be ordered, such as:  X-rays of the affected joint.  An MRI scan.  Blood tests to rule out other types of arthritis.  Joint fluid tests. This involves using a needle to draw fluid from the joint and examining the fluid under a microscope. TREATMENT  Goals of treatment are to control pain and improve joint function. Treatment plans may include:  A prescribed exercise program that allows for rest and joint relief.  A weight control plan.  Pain relief techniques, such as:  Properly applied heat and cold.  Electric pulses delivered to nerve endings under the skin (transcutaneous electrical nerve stimulation [TENS]).  Massage.  Certain nutritional supplements.  Medicines to control pain, such as:  Acetaminophen.  Nonsteroidal anti-inflammatory drugs (NSAIDs), such as naproxen.  Narcotic or central-acting agents, such as tramadol.  Corticosteroids. These can be given orally or as an injection.  Surgery to reposition the bones and relieve pain (osteotomy) or to remove loose pieces  of bone and cartilage. Joint replacement may be needed in advanced states of osteoarthritis. HOME CARE INSTRUCTIONS   Take medicines only as directed by your health care provider.  Maintain a healthy weight. Follow your health care provider's instructions for weight control. This may include dietary instructions.  Exercise as directed. Your health care provider can recommend specific types of exercise. These may include:  Strengthening exercises. These are done to strengthen the muscles that support joints affected by arthritis. They can be performed with weights or with exercise bands to add resistance.  Aerobic activities. These are exercises, such as brisk walking or low-impact aerobics, that get your heart pumping.  Range-of-motion activities. These keep your joints limber.  Balance and agility exercises. These help you maintain daily living skills.  Rest your affected joints as directed by your health care provider.  Keep all follow-up visits as directed by your health care provider. SEEK MEDICAL CARE IF:   Your skin turns red.  You develop a rash in addition to your joint pain.  You have worsening joint pain.  You have a fever along with joint or muscle aches. SEEK IMMEDIATE MEDICAL CARE IF:  You have a significant loss of weight or appetite.  You have night sweats. [place] of Arthritis and Musculoskeletal and Skin Diseases: www.niams.SouthExposed.es  [employer] on Aging: http://kim-miller.com/  American College of Rheumatology: www.rheumatology.org Document Released: 06/16/2005 Document Revised: 10/31/2013 Document Reviewed: 02/21/2013 [facility] Patient Information 2015 [place], [place]. This information is not intended to replace advice given to you by your health care provider. Make sure you discuss any questions you have with your health care provider.

## 2014-06-08 NOTE — ED Provider Notes (Signed)
CSN: [id number]     Arrival date & time 06/08/14  0906 History   First MD Initiated Contact with Patient 06/08/14 0920     Chief Complaint  Patient presents with  . Eye Problem   (Consider location/radiation/quality/duration/timing/severity/associated sxs/prior Treatment) HPI Comments: 73 year old female states that she was on a 9 hour train ride yesterday and after the ride she noticed stiffness in both of her knees. She has severe end-stage DJD of both knees and has been recommended to have knee replacements. This is well documented in the [facility] physical therapy and sports medicine department. She states that the stiffness will generally go away with aspirin and cold compresses.  Her primary  concern is that of right watering and redness that began a little less than 48 hours ago. Denies pain or itching. She states the drainage is white.  Patient is a 73 y.o. female presenting with eye problem.  Eye Problem Associated symptoms: discharge and redness   Associated symptoms: no headaches and no photophobia     Past Medical History  Diagnosis Date  . Arrhythmia     Unknown to patient, states she was on diltiazem and digoxin 20 years ago  . Osteoarthritis   . Female pelvic pain   . Mole (skin)   . Borderline glaucoma   . GERD (gastroesophageal reflux disease)   . Type 2 diabetes mellitus   . Hypertension   . Obesity   . Sleep apnea   . MRSA (methicillin resistant staph aureus) culture positive   . Systolic murmur   . 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 Qualifier: Diagnosis of  By: Kennon Rounds MD, Lavella Lemons     . History of MI (myocardial infarction) 1990s    Not treated at the time, noted q waves on ECG later   Past Surgical History  Procedure Laterality Date  . Vaginal hysterectomy  1982    For fibroids  . Knee arthroscopy    . Laparoscopy    . Dilation and curettage of uterus    . Breast biopsy    . Oophorectomy     Family History  Problem  Relation Age of Onset  . Alcohol abuse Father   . Cancer Maternal Grandmother     throat cancer   History  Substance Use Topics  . Smoking status: Former Smoker -- 1.50 packs/day for 25 years    Types: Cigarettes    Start date: 07/31/1963    Quit date: 07/30/1988  . Smokeless tobacco: Not on file  . Alcohol Use: No   OB History    No data available     Review of Systems  Constitutional: Negative.   HENT: Positive for rhinorrhea. Negative for congestion, ear pain and sore throat.   Eyes: Positive for discharge and redness. Negative for photophobia, pain and visual disturbance.  Respiratory: Negative for cough and shortness of breath.   Cardiovascular: Negative for chest pain and palpitations.  Gastrointestinal: Negative.   Musculoskeletal: Positive for arthralgias.  Skin: Negative.   Neurological: Negative for dizziness and headaches.    Allergies  Meperidine hcl; Levemir; Metformin and related; Nickel; Rosiglitazone; and Penicillins  Home Medications   Prior to Admission medications   Medication Sig Start Date End Date Taking? Authorizing Provider  ACCU-CHEK FASTCLIX LANCETS MISC 1 each by Does not apply route 3 (three) times daily. Check sugar 10 x daily 12/19/13   Angelica Ran, MD  acetaminophen (TYLENOL) 500 MG tablet Take 500 mg by mouth  as needed.     Historical Provider, MD  albuterol (PROVENTIL HFA;VENTOLIN HFA) 108 (90 BASE) MCG/ACT inhaler Inhale 1-2 puffs into the lungs every 6 (six) hours as needed for wheezing. 08/28/12   Harden Mo, MD  atorvastatin (LIPITOR) 40 MG tablet Take 1 tablet (40 mg total) by mouth daily. 08/08/13   Angelica Ran, MD  Blood Glucose Monitoring Suppl (ACCU-CHEK NANO SMARTVIEW) W/DEVICE KIT 1 kit by Does not apply route 3 (three) times daily. 12/19/13   Angelica Ran, MD  cetirizine (ZYRTEC) 10 MG tablet Take 1 tablet (10 mg total) by mouth daily. 04/06/13   Angelica Ran, MD  Cholecalciferol (VITAMIN D3) 50000  UNITS CAPS Take 50,000 Units by mouth once a week. 11/19/12   Zigmund Gottron, MD  erythromycin ophthalmic ointment Place a 1/2 inch ribbon of ointment into the lower right eyelid qid for 5 d 06/08/14   Janne Napoleon, NP  glucose blood (ACCU-CHEK AVIVA PLUS) test strip 1 each by Other route 3 (three) times daily. Use as instructed    Cordelia Poche, MD  ibuprofen (ADVIL,MOTRIN) 200 MG tablet Take 200 mg by mouth as needed for pain (for headaches).    Historical Provider, MD  Insulin Pen Needle (B-D ULTRAFINE III SHORT PEN) 31G X 8 MM MISC Dispense amount sufficient for one daily shot. 01/11/14   Cordelia Poche, MD  LANTUS SOLOSTAR 100 UNIT/ML Solostar Pen INJECT (47UNITS) INTO THE SKIN DAILY.    Angelica Ran, MD  losartan (COZAAR) 100 MG tablet Take 0.5 tablets (50 mg total) by mouth daily. 08/09/13   Angelica Ran, MD  Olopatadine HCl 0.2 % SOLN 1 drop each affected eye once daily 06/29/13   Waldemar Dickens, MD  traMADol (ULTRAM) 50 MG tablet Take 1 tablet (50 mg total) by mouth 2 (two) times daily as needed for pain. 03/31/13   Carlos Levering Draper, DO  traMADol (ULTRAM) 50 MG tablet Take 1 tablet (50 mg total) by mouth every 6 (six) hours as needed. 11/23/13   Timothy R Draper, DO   BP 188/59 mmHg  Pulse 100  Temp(Src) 98.5 F (36.9 C) (Oral)  Resp 16  SpO2 97% Physical Exam  Constitutional: She is oriented to person, place, and time. She appears well-developed and well-nourished. No distress.  HENT:  Mouth/Throat: Oropharynx is clear and moist. No oropharyngeal exudate.  Eyes: EOM are normal. Pupils are equal, round, and reactive to light.  Right conjunctiva is mildly erythematous. There is scant clear watery drainage. No purulence. The sclera is clear. No injection. Left eye conjunctivae with minimal lower leg. Erythema. No drainage or other evidence of infection.  Neck: Normal range of motion. Neck supple.  Cardiovascular: Normal rate.   Pulmonary/Chest: Effort normal. No  respiratory distress.  Lymphadenopathy:    She has no cervical adenopathy.  Neurological: She is alert and oriented to person, place, and time. She exhibits normal muscle tone.  Skin: Skin is warm and dry.  Psychiatric: She has a normal mood and affect.  Nursing note and vitals reviewed.   ED Course  Procedures (including critical care time) Labs Review Labs Reviewed - No data to display  Imaging Review No results found.   MDM   1. Conjunctivitis, right eye   2. Other secondary osteoarthritis of both knees    Erythromycin opthal oint qid OD Warm compresses F/U with your local dr as needed; return if worse    Janne Napoleon, NP 06/08/14 [phone]

## 2022-07-09 NOTE — Progress Notes (Deleted)
    SUBJECTIVE:   CHIEF COMPLAINT / HPI:   Katherine Christensen is a 82 y.o. yo old female who presents for a new patient visit.  PMH HTN OSA T2DM - recent A1c 9.1 05/16/22, has CGM Osteoarthritis Lymphedema Gout CAD??  Obstetrical/GYN History ***  Surgical History ***  Social History: Tobacco use: *** Alcohol use: *** Other substance use: *** Living situation: *** Occupation: ***  Medications ***  Allergies ***  Family History ***  Concerns Today ***  HCM Colonoscopy: Mammogram: Pap smear: Immunizations:    PERTINENT  PMH / PSH: ***  OBJECTIVE:   There were no vitals taken for this visit.  General: A&O, NAD HEENT: No sign of trauma, EOM grossly intact Cardiac: RRR, no m/r/g Respiratory: CTAB, normal WOB, no w/c/r GI: Soft, NTTP, non-distended  Extremities: NTTP, no peripheral edema. Neuro: Normal gait, moves all four extremities appropriately. Psych: Appropriate mood and affect   ASSESSMENT/PLAN:   No problem-specific Assessment & Plan notes found for this encounter.     Lenoria Chime, MD [place]

## 2022-07-11 ENCOUNTER — Ambulatory Visit: Payer: 59 | Admitting: Family Medicine

## 2022-07-16 NOTE — Progress Notes (Signed)
Subjective:    Patient ID: Katherine Christensen, female    DOB: [birth year], 82 y.o.   MRN: [id number]   CC: New Patient  BP check Elevated in the office today. Denies symptoms. States she thinks it is related to her fluid status that has been chronic for years. Taking Losartan '50mg'$  daily. States she and her Dr in [place] recently decreased her Losartan dose at her request. Previously on HCTZ but feels it wasn't doing anything.  T2DM:  Most recent A1c 9.1 in 05/2022. States she has been without Lantus for 17 days. States she normally takes 44U although reports this was recently changed by last doctor. Requesting Dexcom for CGM, previously given [employer] but could not use it. Previously tried Metformin and did not like the way it made her feel. Discussed GLP-1 in the past but feels it is too new of a medication.   Leg swelling: States she has had swelling in her legs "since she was a kid". States she has taken water pills on and off throughout her life and she they help. Feels like she has excess fluid currently and notes her weight is higher than usual. Trial compression hose but had difficulty with them.   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 Qualifier: Diagnosis of  By: Kennon Rounds MD, Tanya      Cataract    History of MI (myocardial infarction) 1990s   Not treated at the time, noted q waves on ECG later   Hypertension    MRSA (methicillin resistant staph aureus) culture positive    Obesity    Osteoarthritis    Bilateral knees   Sleep apnea    Not on CPAP   Type 2 diabetes mellitus ([place])      Surgical Hx: Past Surgical History:  Procedure Laterality Date   BREAST BIOPSY     DILATION AND CURETTAGE OF UTERUS     KNEE ARTHROSCOPY     LAPAROSCOPY     OOPHORECTOMY     Both ovary removed   VAGINAL HYSTERECTOMY  06/30/1980   For fibroids     Family Hx: Family History  Problem Relation Age of Onset    Alcohol abuse Father    Cancer Sister        Breast/lung cancer   Heart disease Brother    Cancer Maternal Grandmother        throat cancer     Social Hx: Current Social History 07/17/2022   Who lives at home: Alone Who would speak for you about health care matters: Daughter - Gwynneth Albright Living will or Advance Directive: None, form given. Pending will Transportation: Car Important Relationships & Pets: None Current Stressors: None, friend with strokes. 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   Preventative Screening Colonoscopy: Requesting Cologuard screening, ordered today Mammogram: Requesting Mammogram screening, ordered today Pap test: N/A DEXA: Discuss at follow up Tetanus vaccine: Discuss at follow up Pneumonia vaccine: Discuss at follow up Shingles vaccine: Discuss at follow up Heart stress test: Discuss at follow up Echocardiogram: Discuss at follow up  Smoking status reviewed   Objective:  BP (!) 185/63   Pulse 66   Ht '5\' 2"'$  (1.575 m)   Wt 243 lb (110.2 kg)   SpO2 98%  BMI 44.45 kg/m  Vitals and nursing note reviewed  General: well nourished, in no acute distress HEENT: normocephalic, no scleral icterus or conjunctival pallor, no nasal discharge, moist mucous membranes. Neck: supple, non-tender, without lymphadenopathy Cardiac: RRR, no murmurs, rubs, or gallops Respiratory: CTAB, no increased work of breathing Abdomen: soft, nontender, non-distended Extremities: Bilateral peripheral edema Skin: warm and dry, no rashes noted Neuro: alert and oriented, no focal deficits   Assessment & Plan:    Essential hypertension Uncontrolled today, 185/63 on repeat. Asymptomatic, compliant on Losartan '50mg'$ . Patient requests fluid diuresis prior to discussing medication change.  F/u BP check.  Type 2 diabetes mellitus with diabetic neuropathy, unspecified (HCC) A1c 9.1 in 05/2022. Off Lantus x 17 days, refill sent in today. Requests Dexcom CGM, script sent. Declines pharmacy consult. Discuss peripheral neuropathy and diabetic eye exam at follow up visit.  Peripheral edema Chronic issue, endorses worsening hypervolemia. Trialed compression but had difficulty with use. Started on Lasix '20mg'$  last month but feels it is ineffective. Increase to Lasix '40mg'$  and assess for diuresis in 1 month. H/o MI but endorses normal prior echos. Appears to be secondary to obesity and immobility vs primary lymphoedema.   Mammogram and Cologuard ordered at patient request. Discussed patient is out of age range for testing but patient as not been screened in years and would like to be checked.   Discuss vaccinations at follow up visit.   Colletta Maryland, DO

## 2022-07-17 ENCOUNTER — Encounter: Payer: Self-pay | Admitting: Family Medicine

## 2022-07-17 ENCOUNTER — Ambulatory Visit: Payer: Medicare Other | Admitting: Family Medicine

## 2022-07-17 VITALS — BP 185/63 | HR 66 | Ht 62.0 in | Wt 243.0 lb

## 2022-07-17 DIAGNOSIS — Z794 Long term (current) use of insulin: Secondary | ICD-10-CM

## 2022-07-17 DIAGNOSIS — E1140 Type 2 diabetes mellitus with diabetic neuropathy, unspecified: Secondary | ICD-10-CM

## 2022-07-17 DIAGNOSIS — Z1211 Encounter for screening for malignant neoplasm of colon: Secondary | ICD-10-CM

## 2022-07-17 DIAGNOSIS — Z1231 Encounter for screening mammogram for malignant neoplasm of breast: Secondary | ICD-10-CM | POA: Diagnosis not present

## 2022-07-17 DIAGNOSIS — I10 Essential (primary) hypertension: Secondary | ICD-10-CM | POA: Diagnosis not present

## 2022-07-17 DIAGNOSIS — E1165 Type 2 diabetes mellitus with hyperglycemia: Secondary | ICD-10-CM

## 2022-07-17 DIAGNOSIS — R609 Edema, unspecified: Secondary | ICD-10-CM

## 2022-07-17 MED ORDER — INSULIN GLARGINE 100 UNIT/ML SOLOSTAR PEN: 44.0000 [IU] | PEN_INJECTOR | SUBCUTANEOUS | 11 refills | Status: DC

## 2022-07-17 MED ORDER — FUROSEMIDE 40 MG PO TABS: 40.0000 mg | ORAL_TABLET | Freq: Every day | ORAL | 0 refills | Status: DC

## 2022-07-17 MED ORDER — CONTINUOUS GLUCOSE MONITOR SUP KIT: 1.0000 | PACK | Freq: Every day | 10 refills | Status: DC

## 2022-07-17 NOTE — Assessment & Plan Note (Signed)
Chronic issue, endorses worsening hypervolemia. Trialed compression but had difficulty with use. Started on Lasix '20mg'$  last month but feels it is ineffective. Increase to Lasix '40mg'$  and assess for diuresis in 1 month. H/o MI but endorses normal prior echos. Appears to be secondary to obesity and immobility vs primary lymphoedema.

## 2022-07-17 NOTE — Assessment & Plan Note (Signed)
Uncontrolled today, 185/63 on repeat. Asymptomatic, compliant on Losartan '50mg'$ . Patient requests fluid diuresis prior to discussing medication change. F/u BP check.

## 2022-07-17 NOTE — Patient Instructions (Addendum)
It was wonderful to see you today! Thank you for choosing [facility].   Please bring ALL of your medications with you to every visit.   Today we talked about:  I am reordered your Lantus, please take 44 units every night. I am also putting in an order for the Dexcom. Your blood pressure is high today. We can re check at your next visit and discuss transitioning to a different medication. I put in the order to increase the Lasix to '40mg'$ . Please keep an eye on the amount of fluid you are urinating. I placed on order for Mammogram and Cologuard. You can walk in and get the mammogram at [facility] ([address], [place], [place] [address]), phone number [phone])  Please follow up in 3-4 weeks  If you haven't already, sign up for My Chart to have easy access to your labs results, and communication with your primary care physician.   We are checking some labs today. If they are abnormal, I will call you. If they are normal, I will send you a MyChart message (if it is active) or a letter in the mail. If you do not hear about your labs in the next 2 weeks, please call the office.  Call the clinic at [phone] if your symptoms worsen or you have any concerns.  Please be sure to schedule follow up at the front desk before you leave today.   Colletta Maryland, DO Family Medicine

## 2022-07-17 NOTE — Assessment & Plan Note (Addendum)
A1c 9.1 in 05/2022. Off Lantus x 17 days, refill sent in today. Requests Dexcom CGM, script sent. Declines pharmacy consult. Discuss peripheral neuropathy and diabetic eye exam at follow up visit.

## 2022-07-24 ENCOUNTER — Other Ambulatory Visit: Payer: Self-pay

## 2022-07-29 ENCOUNTER — Telehealth: Payer: Self-pay

## 2022-07-29 NOTE — Telephone Encounter (Signed)
Received phone call from patient regarding CGM. She reports that pharmacy needs a new prescription.   [facility]. They need an updated detailed prescription to be able to file under her Medicare insurance. This also has to be sent in by attending.   I have pended these orders to this encounter. Will forward to preceptor Nori Riis) to sign.   Talbot Grumbling, RN

## 2022-07-30 MED ORDER — DEXCOM G7 SENSOR MISC: 3 refills | Status: AC

## 2022-07-30 MED ORDER — DEXCOM G7 RECEIVER DEVI: 0 refills | Status: AC

## 2022-08-01 NOTE — Progress Notes (Signed)
    SUBJECTIVE:   CHIEF COMPLAINT / HPI:   Hypertension: - Medications: Losartan '50mg'$  - Compliance: Yes - Checking BP at home: Occasionally - Denies any SOB, CP, vision changes, LE edema, medication SEs, or symptoms of hypotension - Diet and exercise: Working on weight loss  Diabetes Reports worsening neuropathy described as burning and tingling. Podiatrist gave her Lidocaine ointment, does not help. Has trialed other creams such as Voltaran without relief. Current Regimen: Lantus 44U CBGs: Not reported Last A1c:  Lab Results  Component Value Date   HGBA1C 7.7 (A) 08/07/2022    Denies polyuria, polydipsia, hypoglycemia. Last Eye Exam: 01/2022 [facility]). Monitoring mild diabetic retinopathy Statin: Lipitor '40mg'$  (not taking). Lipid elevated 02/2022, LDL 127, Tchol 203 ACE/ARB: Losartan '50mg'$ . Most recent PC ratio normal.  Peripheral edema/Orthopnea: Reports improving peripheral edema when taking Lasix '30mg'$ . States she did not need the Lasix '40mg'$  as she had good diuresis with Lasix '30mg'$ . Reports wheezing and SOB occurring at night. States she sleeps at an incline most evening. H/o prior MI. Denies echo in the last 5 years.  Health maintenance: Tetanus vaccine: Next time Pneumonia vaccine: UTD Influenza: UTD Shingles vaccine: Rx sent to pharmacy   PERTINENT  PMH / [place]: HTN, T2DM with neuropathy  OBJECTIVE:   BP (!) 154/64   Pulse 85   Ht '5\' 2"'$  (1.575 m)   Wt 234 lb (106.1 kg)   SpO2 99%   BMI 42.80 kg/m   General: NAD, pleasant, able to participate in exam Cardiac: RRR, no murmurs. Respiratory: CTAB, normal effort, No wheezes, rales or rhonchi Abdomen: Bowel sounds present, nontender, nondistended Extremities: no edema or cyanosis. Skin: warm and dry, no rashes noted Neuro: alert, no obvious focal deficits Psych: Normal affect and mood  ASSESSMENT/PLAN:   Essential hypertension Elevated today (150s/60s) but improved from last visit (180s/60s).  Asymptomatic. Compliant on Losartan '50mg'$ . Patient prefers to defer medication changes given improvement with Lasix and weight loss. F/u 1 month for BP check.  Type 2 diabetes mellitus with diabetic neuropathy, unspecified (HCC) A1c 7.7, improved from 9.1 (04/2022). Compliant on Lantus 44U. Denies sx hypoglycemia. UTD on eye and foot exam. Repeat A1c in 3 months. -Worsening neuropathy not controlled by OTC ointments. Trial Gabapentin '100mg'$  qhs given sx most prevalent at night.  HYPERLIPIDEMIA Not taking Lipitor '40mg'$ , most recent lipid panel uncontrolled in 02/2022. Refill Lipitor '40mg'$  and restart medication. Discussed risks and benefits with the patient.  Peripheral edema Improved on Lasix '30mg'$ , reports adequate diuresis without increasing to Lasix '40mg'$ . Associated with orthopnea and nocturnal wheezing. H/o MI. Obtain echo to eval for HF. Possible related to underlying OSA.  Healthcare maintenance Rx for Shingrix sent to Pharmacy. Due for TDaP, deferred until next visit.  Knee osteoarthritis Interested in knee replacement under regional anesthesia. Not interested in general anesthesia. Will discuss with faculty and revisit at next appointment. Pain currently controlled with Tylenol and periodic knee injections.     Dr. Colletta Maryland, [place]

## 2022-08-07 ENCOUNTER — Encounter: Payer: Self-pay | Admitting: Family Medicine

## 2022-08-07 ENCOUNTER — Ambulatory Visit (INDEPENDENT_AMBULATORY_CARE_PROVIDER_SITE_OTHER): Payer: Medicare Other | Admitting: Family Medicine

## 2022-08-07 VITALS — BP 154/64 | HR 85 | Ht 62.0 in | Wt 234.0 lb

## 2022-08-07 DIAGNOSIS — M170 Bilateral primary osteoarthritis of knee: Secondary | ICD-10-CM

## 2022-08-07 DIAGNOSIS — Z794 Long term (current) use of insulin: Secondary | ICD-10-CM

## 2022-08-07 DIAGNOSIS — R0601 Orthopnea: Secondary | ICD-10-CM | POA: Diagnosis not present

## 2022-08-07 DIAGNOSIS — Z0000 Encounter for general adult medical examination without abnormal findings: Secondary | ICD-10-CM

## 2022-08-07 DIAGNOSIS — Z23 Encounter for immunization: Secondary | ICD-10-CM

## 2022-08-07 DIAGNOSIS — E1140 Type 2 diabetes mellitus with diabetic neuropathy, unspecified: Secondary | ICD-10-CM

## 2022-08-07 DIAGNOSIS — I10 Essential (primary) hypertension: Secondary | ICD-10-CM

## 2022-08-07 DIAGNOSIS — R609 Edema, unspecified: Secondary | ICD-10-CM

## 2022-08-07 LAB — POCT GLYCOSYLATED HEMOGLOBIN (HGB A1C): HbA1c, POC (controlled diabetic range): 7.7 % — AB (ref 0.0–7.0)

## 2022-08-07 MED ORDER — GABAPENTIN 100 MG PO CAPS: 100.0000 mg | ORAL_CAPSULE | Freq: Every day | ORAL | 1 refills | Status: AC

## 2022-08-07 MED ORDER — ATORVASTATIN CALCIUM 40 MG PO TABS: 40.0000 mg | ORAL_TABLET | Freq: Every day | ORAL | 1 refills | Status: DC

## 2022-08-07 NOTE — Patient Instructions (Signed)
It was wonderful to see you today! Thank you for choosing [facility].   Please bring ALL of your medications with you to every visit.   Today we talked about:  Your blood pressure was elevated today but has improved from your prior visit. Please continue to take your Losartan '50mg'$  and check your blood pressure at home. I would like to see you in 1 month to follow up. Your A1c is 7.7! Great job working on [employer] and exercise and please continue to take the Lantus 44 units. We will recheck your A1c again in 3 months.  Please start taking your Lipitor for better cholesterol control to help prevent heart attacks and strokes. I am ordering an Echo to assess the function of your heart since you are having that shortness of breath at night and fluid on your legs. We will call you to schedule it. I sent in a prescription for the Shingles vaccine into the pharmacy to obtain at your convenience.  Please follow up in 1 month  If you haven't already, sign up for My Chart to have easy access to your labs results, and communication with your primary care physician.   We are checking some labs today. If they are abnormal, I will call you. If they are normal, I will send you a MyChart message (if it is active) or a letter in the mail. If you do not hear about your labs in the next 2 weeks, please call the office.  Call the clinic at [phone] if your symptoms worsen or you have any concerns.  Please be sure to schedule follow up at the front desk before you leave today.   Colletta Maryland, DO Family Medicine

## 2022-08-07 NOTE — Assessment & Plan Note (Signed)
Rx for Shingrix sent to Pharmacy. Due for TDaP, deferred until next visit.

## 2022-08-07 NOTE — Assessment & Plan Note (Addendum)
Elevated today (150s/60s) but improved from last visit (180s/60s). Asymptomatic. Compliant on Losartan '50mg'$ . Patient prefers to defer medication changes given improvement with Lasix and weight loss. F/u 1 month for BP check.

## 2022-08-07 NOTE — Assessment & Plan Note (Signed)
Interested in knee replacement under regional anesthesia. Not interested in general anesthesia. Will discuss with faculty and revisit at next appointment. Pain currently controlled with Tylenol and periodic knee injections.

## 2022-08-07 NOTE — Assessment & Plan Note (Addendum)
A1c 7.7, improved from 9.1 (04/2022). Compliant on Lantus 44U. Denies sx hypoglycemia. UTD on eye and foot exam. Repeat A1c in 3 months. -Worsening neuropathy not controlled by OTC ointments. Trial Gabapentin '100mg'$  qhs given sx most prevalent at night.

## 2022-08-07 NOTE — Assessment & Plan Note (Signed)
Improved on Lasix '30mg'$ , reports adequate diuresis without increasing to Lasix '40mg'$ . Associated with orthopnea and nocturnal wheezing. H/o MI. Obtain echo to eval for HF. Possible related to underlying OSA.

## 2022-08-07 NOTE — Assessment & Plan Note (Signed)
Not taking Lipitor '40mg'$ , most recent lipid panel uncontrolled in 02/2022. Refill Lipitor '40mg'$  and restart medication. Discussed risks and benefits with the patient.

## 2022-08-11 ENCOUNTER — Other Ambulatory Visit (HOSPITAL_COMMUNITY): Payer: Self-pay

## 2022-08-21 ENCOUNTER — Other Ambulatory Visit: Payer: Self-pay

## 2022-08-21 DIAGNOSIS — R609 Edema, unspecified: Secondary | ICD-10-CM

## 2022-08-21 DIAGNOSIS — E1165 Type 2 diabetes mellitus with hyperglycemia: Secondary | ICD-10-CM

## 2022-08-21 MED ORDER — INSULIN GLARGINE 100 UNIT/ML SOLOSTAR PEN: 44.0000 [IU] | PEN_INJECTOR | SUBCUTANEOUS | 11 refills | Status: DC

## 2022-08-21 MED ORDER — FUROSEMIDE 40 MG PO TABS: 40.0000 mg | ORAL_TABLET | Freq: Every day | ORAL | 0 refills | Status: DC

## 2022-08-21 NOTE — Telephone Encounter (Signed)
Patient calls nurse line requesting refills of Lasix and Lantus.   Patient reports that she used paper prescription at [facility]. She is now needing these prescriptions to go to [facility] on [address].   Talbot Grumbling, RN

## 2022-08-26 ENCOUNTER — Encounter (HOSPITAL_COMMUNITY): Payer: Self-pay

## 2022-08-26 ENCOUNTER — Ambulatory Visit (HOSPITAL_COMMUNITY)
Admission: EM | Admit: 2022-08-26 | Discharge: 2022-08-26 | Disposition: A | Discharge status: Home / Self Care | Payer: Medicare Other | Attending: Internal Medicine | Admitting: Internal Medicine

## 2022-08-26 DIAGNOSIS — H10023 Other mucopurulent conjunctivitis, bilateral: Secondary | ICD-10-CM | POA: Diagnosis not present

## 2022-08-26 DIAGNOSIS — B9689 Other specified bacterial agents as the cause of diseases classified elsewhere: Secondary | ICD-10-CM | POA: Diagnosis not present

## 2022-08-26 DIAGNOSIS — H109 Unspecified conjunctivitis: Secondary | ICD-10-CM

## 2022-08-26 MED ORDER — POLYMYXIN B-TRIMETHOPRIM 10000-0.1 UNIT/ML-% OP SOLN: 1.0000 [drp] | OPHTHALMIC | 0 refills | Status: AC

## 2022-08-26 NOTE — ED Triage Notes (Signed)
Pt states has bloody secretion from her nose when she blows. Pt c/o bilateral eye redness and drainage x2 days.

## 2022-08-26 NOTE — Discharge Instructions (Signed)
You have bacterial conjunctivitis (pink eye) which is an eye infection.    - Use antibiotic eye medication sent to pharmacy as directed.  - Change your pillowcase after 2 to 3 days to avoid reinfection.  - You may take Tylenol every 6 hours as needed for any pain you may have.  - Avoid scratching your eye.  Wash your hands frequently to avoid spread of infection to others.  Perform warm compresses to your eye before applying the eye medication.  If you develop any new or worsening symptoms or do not improve in the next 2 to 3 days, please return.  If your symptoms are severe, please go to the emergency room.  Follow-up with your primary care provider for further evaluation and management of your symptoms as well as ongoing wellness visits.  I hope you feel better!

## 2022-08-26 NOTE — ED Provider Notes (Signed)
[place]    CSN: [id number] Arrival date & time: 08/26/22  1525      History   Chief Complaint Chief Complaint  Patient presents with  . Eye Drainage  . Nasal Congestion    HPI Katherine Christensen is a 82 y.o. female.   Patient presents to urgent care for evaluation of eye drainage, crusting, and redness that initially started to the right eye then spread to the left eye starting a few days ago. History of "dry eyes" and has eye drops for this.  States she has had copious drainage from bilateral eyes that has been white and clear with crusting in the mornings. No known sick contacts with similar symptoms. She has not had any headache, viral URI symptoms, fever/chills, eye pain, dizziness, vision changes, or ear pain. No trauma/injuries to the eyes or concern for foreign body.  History of type 2 diabetes and MRSA.  Slight swelling to the bilateral eyelids, denies warmth and pain to the eyelids and surrounding area of the eyes.  No recent antibiotic or steroid use.  She is also complaining of blood-tinged nasal mucus when blowing her nose intermittently.  Denies recent nosebleeds, use of anticoagulation, and nasal congestion.  History of allergic sinusitis.  Not currently taking any medications for this.  No cough.    Past Medical History:  Diagnosis Date  . 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 Benign. Qualifier: Diagnosis of  By: Kennon Rounds MD, Lavella Lemons     . Cataract   . History of MI (myocardial infarction) 1990s   Not treated at the time, noted q waves on ECG later  . Hypertension   . MRSA (methicillin resistant staph aureus) culture positive   . Obesity   . Osteoarthritis    Bilateral knees  . Sleep apnea    Not on CPAP  . Type 2 diabetes mellitus [facility])     Patient Active Problem List   Diagnosis Date Noted  . Allergic sinusitis 04/08/2013  . History of MI (myocardial infarction) 01/19/2013  . Healthcare maintenance 01/19/2013  . Trigger finger,  acquired 10/08/2012  . Type 2 diabetes mellitus with diabetic neuropathy, unspecified ([place]) 09/01/2012  . Vertigo 02/28/2012  . Knee osteoarthritis 06/20/2011  . Peripheral edema 10/18/2009  . 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 Gout, unspecified 08/24/2007  . HEMORRHOIDS 07/21/2006  . HYPERLIPIDEMIA 04/22/2006  . OBESITY 04/22/2006  . SLEEP APNEA, OBSTRUCTIVE 04/22/2006  . Essential hypertension 04/22/2006  . SYSTOLIC MURMUR [id number]    Past Surgical History:  Procedure Laterality Date  . BREAST BIOPSY    . DILATION AND CURETTAGE OF UTERUS    . KNEE ARTHROSCOPY    . LAPAROSCOPY    . OOPHORECTOMY     Both ovary removed  . VAGINAL HYSTERECTOMY  06/30/1980   For fibroids    OB History     Gravida  0   Para  0   Term  0   Preterm  0   AB  0   Living  0      SAB  0   IAB  0   Ectopic  0   Multiple  0   Live Births  0            Home Medications    Prior to Admission medications   Medication Sig Start Date End Date Taking? Authorizing Provider  trimethoprim-polymyxin b (POLYTRIM) ophthalmic solution Place 1 drop into both eyes every 4 (four) hours for  7 days. 08/26/22 09/02/22 Yes Lilya Smitherman, Stasia Cavalier, FNP  ACCU-CHEK FASTCLIX LANCETS MISC 1 each by Does not apply route 3 (three) times daily. Check sugar 10 x daily 12/19/13   Angelica Ran, MD  acetaminophen (TYLENOL) 500 MG tablet Take 500 mg by mouth as needed.     [provider]  atorvastatin (LIPITOR) 40 MG tablet Take 1 tablet (40 mg total) by mouth daily. 08/07/22   Colletta Maryland, MD  Blood Glucose Monitoring Suppl (ACCU-CHEK NANO SMARTVIEW) W/DEVICE KIT 1 kit by Does not apply route 3 (three) times daily. 12/19/13   Angelica Ran, MD  Continuous Blood Gluc Receiver ([place]) DEVI Please use to check blood sugar levels. E11.4 07/30/22   Dickie La, MD  Continuous Blood Gluc Sensor (DEXCOM G7 SENSOR) MISC Please use to check blood sugar  levels. Sensor will need to be replaced every 10 days. E11.4 07/30/22   Dickie La, MD  Continuous Glucose Monitor Sup KIT 1 each by Does not apply route daily. 07/17/22   Colletta Maryland, MD  furosemide (LASIX) 40 MG tablet Take 1 tablet (40 mg total) by mouth daily. 08/21/22   Colletta Maryland, MD  gabapentin (NEURONTIN) 100 MG capsule Take 1 capsule (100 mg total) by mouth at bedtime. 08/07/22   Colletta Maryland, MD  glucose blood (ACCU-CHEK AVIVA PLUS) test strip 1 each by Other route 3 (three) times daily. Use as instructed    Mariel Aloe, MD  insulin glargine (LANTUS) 100 UNIT/ML Solostar Pen Inject 44 Units into the skin every morning. 08/21/22   Colletta Maryland, MD  Insulin Pen Needle (B-D ULTRAFINE III SHORT PEN) 31G X 8 MM MISC Dispense amount sufficient for one daily shot. 01/11/14   Mariel Aloe, MD  losartan (COZAAR) 100 MG tablet Take 0.5 tablets (50 mg total) by mouth daily. 08/09/13   Angelica Ran, MD  Olopatadine HCl 0.2 % SOLN 1 drop each affected eye once daily 06/29/13   Waldemar Dickens, MD    Family History Family History  Problem Relation Age of Onset  . Alcohol abuse Father   . Cancer Sister        Breast/lung cancer  . Heart disease Brother   . Cancer Maternal Grandmother        throat cancer    Social History Social History   Tobacco Use  . Smoking status: Former    Packs/day: 1.50    Years: 25.00    Total pack years: 37.50    Types: Cigarettes    Start date: 07/31/1963    Quit date: 07/30/1988    Years since quitting: 34.0  Substance Use Topics  . Alcohol use: Never  . Drug use: Never     Allergies   Meperidine hcl, Levemir [insulin detemir], Metformin and related, Nickel, Rosiglitazone, and Penicillins   Review of Systems Review of Systems Per HPI  Physical Exam Triage Vital Signs ED Triage Vitals  Enc Vitals Group     BP 08/26/22 1614 (!) 170/72     Pulse Rate 08/26/22 1614 88     Resp 08/26/22 1614 18     Temp 08/26/22 1614  98.7 F (37.1 C)     Temp Source 08/26/22 1614 Oral     SpO2 08/26/22 1614 96 %     Weight --      Height --      Head Circumference --      Peak Flow --  Pain Score 08/26/22 1616 0     Pain Loc --      Pain Edu? --      Excl. in [place]? --    No data found.  Updated Vital Signs BP (!) 170/72 (BP Location: Left Arm)   Pulse 88   Temp 98.7 F (37.1 C) (Oral)   Resp 18   SpO2 96%   Visual Acuity Right Eye Distance: 20/70 Left Eye Distance: 20/50 Bilateral Distance: 20/50  Right Eye Near:   Left Eye Near:    Bilateral Near:     Physical Exam Vitals and nursing note reviewed.  Constitutional:      Appearance: She is not ill-appearing or toxic-appearing.  HENT:     Head: Normocephalic and atraumatic.     Right Ear: Hearing, tympanic membrane, ear canal and external ear normal.     Left Ear: Hearing, tympanic membrane, ear canal and external ear normal.     Nose: Nose normal.     Mouth/Throat:     Lips: Pink.     Mouth: Mucous membranes are moist. No injury.     Tongue: No lesions. Tongue does not deviate from midline.     Palate: No mass and lesions.     Pharynx: Oropharynx is clear. Uvula midline. No pharyngeal swelling, oropharyngeal exudate, posterior oropharyngeal erythema or uvula swelling.     Tonsils: No tonsillar exudate or tonsillar abscesses.  Eyes:     General: Lids are normal. Vision grossly intact. Gaze aligned appropriately.        Right eye: No discharge.        Left eye: No discharge.     Extraocular Movements: Extraocular movements intact.     Conjunctiva/sclera:     Right eye: Right conjunctiva is injected.     Left eye: Left conjunctiva is injected.  Cardiovascular:     Rate and Rhythm: Normal rate and regular rhythm.     Heart sounds: Normal heart sounds, S1 normal and S2 normal.  Pulmonary:     Effort: Pulmonary effort is normal. No respiratory distress.     Breath sounds: Normal breath sounds and air entry. No wheezing, rhonchi or rales.   Musculoskeletal:     Cervical back: Neck supple.  Lymphadenopathy:     Cervical: No cervical adenopathy.  Skin:    General: Skin is warm and dry.     Capillary Refill: Capillary refill takes less than 2 seconds.     Findings: No rash.  Neurological:     General: No focal deficit present.     Mental Status: She is alert and oriented to person, place, and time. Mental status is at baseline.     Cranial Nerves: No dysarthria or facial asymmetry.  Psychiatric:        Mood and Affect: Mood normal.        Speech: Speech normal.        Behavior: Behavior normal.        Thought Content: Thought content normal.        Judgment: Judgment normal.     UC Treatments / Results  Labs (all labs ordered are listed, but only abnormal results are displayed) Labs Reviewed - No data to display  EKG   Radiology No results found.  Procedures Procedures (including critical care time)  Medications Ordered in UC Medications - No data to display  Initial Impression / Assessment and Plan / UC Course  I have reviewed the triage vital signs and the nursing  notes.  Pertinent labs & imaging results that were available during my care of the patient were reviewed by me and considered in my medical decision making (see chart for details).     *** Final Clinical Impressions(s) / UC Diagnoses   Final diagnoses:  Bacterial conjunctivitis of both eyes     Discharge Instructions      You have bacterial conjunctivitis (pink eye) which is an eye infection.    - Use antibiotic eye medication sent to pharmacy as directed.  - Change your pillowcase after 2 to 3 days to avoid reinfection.  - You may take Tylenol every 6 hours as needed for any pain you may have.  - Avoid scratching your eye.  Wash your hands frequently to avoid spread of infection to others.  Perform warm compresses to your eye before applying the eye medication.  If you develop any new or worsening symptoms or do not improve in  the next 2 to 3 days, please return.  If your symptoms are severe, please go to the emergency room.  Follow-up with your primary care provider for further evaluation and management of your symptoms as well as ongoing wellness visits.  I hope you feel better!     ED Prescriptions     Medication Sig Dispense Auth. Provider   trimethoprim-polymyxin b (POLYTRIM) ophthalmic solution Place 1 drop into both eyes every 4 (four) hours for 7 days. 10 mL Talbot Grumbling, FNP      PDMP not reviewed this encounter.

## 2022-08-27 ENCOUNTER — Ambulatory Visit (HOSPITAL_COMMUNITY)
Admission: RE | Admit: 2022-08-27 | Discharge: 2022-08-27 | Disposition: A | Discharge status: Home / Self Care | Payer: Medicare Other | Source: Ambulatory Visit | Attending: Family Medicine | Admitting: Family Medicine

## 2022-08-27 DIAGNOSIS — E119 Type 2 diabetes mellitus without complications: Secondary | ICD-10-CM | POA: Insufficient documentation

## 2022-08-27 DIAGNOSIS — E785 Hyperlipidemia, unspecified: Secondary | ICD-10-CM | POA: Diagnosis not present

## 2022-08-27 DIAGNOSIS — I3481 Nonrheumatic mitral (valve) annulus calcification: Secondary | ICD-10-CM | POA: Diagnosis not present

## 2022-08-27 DIAGNOSIS — G4730 Sleep apnea, unspecified: Secondary | ICD-10-CM | POA: Insufficient documentation

## 2022-08-27 DIAGNOSIS — I10 Essential (primary) hypertension: Secondary | ICD-10-CM | POA: Insufficient documentation

## 2022-08-27 DIAGNOSIS — R0609 Other forms of dyspnea: Secondary | ICD-10-CM

## 2022-08-27 DIAGNOSIS — R0600 Dyspnea, unspecified: Secondary | ICD-10-CM | POA: Insufficient documentation

## 2022-08-27 DIAGNOSIS — R0601 Orthopnea: Secondary | ICD-10-CM | POA: Diagnosis present

## 2022-08-27 LAB — ECHOCARDIOGRAM COMPLETE
Area-P 1/2: 2.73 cm2
MV VTI: 1.71 cm2
S' Lateral: 2.8 cm

## 2022-09-26 ENCOUNTER — Ambulatory Visit: Payer: 59

## 2022-09-29 ENCOUNTER — Ambulatory Visit: Payer: Medicare Other | Admitting: Family Medicine

## 2022-09-29 NOTE — Progress Notes (Deleted)
    SUBJECTIVE:   CHIEF COMPLAINT / HPI:   BP check Hypertension: Echo showed Grade 1 diastolic dysfunction, need to optimize BP - Medications: Losartan 50mg   - Compliance: *** - Checking BP at home: *** - Denies any SOB, CP, vision changes, LE edema, medication SEs, or symptoms of hypotension - Diet: *** - Exercise: ***   Diabetes check: Diabetes Trialed Gabapentin at bedtime last visit. Current Regimen: *** CBGs: ***  Last A1c:  Lab Results  Component Value Date   HGBA1C 7.7 (A) 08/07/2022    Denies polyuria, polydipsia, hypoglycemia *** Last Eye Exam: 01/2022 Statin: Lipitor 40mg  (check if taking) ACE/ARB: Lodartan 50mg . PCR ratio normal in 04/2022  Tetanus vaccine? - send to pharmacy  OSA evaluation? Looks like most recent testing in 2014. Echo mostly normal  PERTINENT  PMH / PSH: ***  OBJECTIVE:   There were no vitals taken for this visit. ***  General: NAD, pleasant, able to participate in exam Cardiac: RRR, no murmurs. Respiratory: CTAB, normal effort, No wheezes, rales or rhonchi Abdomen: Bowel sounds present, nontender, nondistended Extremities: no edema or cyanosis. Skin: warm and dry, no rashes noted Neuro: alert, no obvious focal deficits Psych: Normal affect and mood  ASSESSMENT/PLAN:   No problem-specific Assessment & Plan notes found for this encounter.     Dr. Colletta Maryland, [place]    {    This will disappear when note is signed, click to select method of visit    :1}

## 2022-10-03 ENCOUNTER — Ambulatory Visit: Payer: Medicare Other | Admitting: Family Medicine

## 2022-10-07 NOTE — Progress Notes (Deleted)
    SUBJECTIVE:   CHIEF COMPLAINT / HPI:   Hypertension: - Medications: Losartan 50mg  daily, Lasix 40mg  daily - Compliance: *** - Checking BP at home: *** - Denies any SOB, CP, vision changes, LE edema, medication SEs, or symptoms of hypotension - Diet: *** - Exercise: ***   *Lipid panel, CMP *Needs 2nd agent  PERTINENT  PMH / PSH: ***  OBJECTIVE:   There were no vitals taken for this visit. ***  General: NAD, pleasant, able to participate in exam Cardiac: RRR, no murmurs. Respiratory: CTAB, normal effort, No wheezes, rales or rhonchi Abdomen: Bowel sounds present, nontender, nondistended Extremities: no edema or cyanosis. Skin: warm and dry, no rashes noted Neuro: alert, no obvious focal deficits Psych: Normal affect and mood  ASSESSMENT/PLAN:   No problem-specific Assessment & Plan notes found for this encounter.     Dr. Elberta Fortis, DO [place] [facility]    {    This will disappear when note is signed, click to select method of visit    :1}

## 2022-10-08 ENCOUNTER — Ambulatory Visit: Payer: Medicare Other | Admitting: Family Medicine

## 2022-10-14 NOTE — Progress Notes (Unsigned)
    SUBJECTIVE:   CHIEF COMPLAINT / HPI:   Hypertension: - Medications: Losartan  - Compliance: Yes - Checking BP at home: No - has not been able to find BP cuff due to moving - Denies any SOB, CP, vision changes, LE edema, medication SEs, or symptoms of hypotension - Diet: Eating less than she usually does. Down 15 pounds.  Diabetes A1c deferred today given weight loss, medication compliance and previous A1c less than 3 months ago and less than 8.  Current Regimen:  Lantus 44U  CBGs: Waiting to get her Dexcom, should be available soon  Last A1c:  Lab Results  Component Value Date   HGBA1C 7.7 (A) 08/07/2022    Denies polyuria, polydipsia, hypoglycemia. Last Eye Exam: 01/2022 [facility]). Monitoring mild diabetic retinopathy  Statin: Lipitor  daily ACE/ARB: Losartan  daily   Health maintenance: TDaP: rx sent to pharmacy  PERTINENT  PMH / PSH: T2DM with neuropathy, HTN  OBJECTIVE:   BP 138/70   Pulse 93   Ht  (1.575 m)   Wt 217 lb (98.4 kg)   SpO2 98%   BMI 39.69 kg/m    General: NAD, pleasant, able to participate in exam Cardiac: RRR, no murmurs. Respiratory: CTAB, normal effort, No wheezes, rales or rhonchi Extremities: Chronic peripheral lymphedema bilaterally. Minimal pitting edema bilaterally. Skin: warm and dry, no rashes noted  ASSESSMENT/PLAN:   Essential hypertension 138/70 today, improved from prior visits. Compliant on Losartan , continue current regimen. Not checking at home since cannot find cuff -Recommend checking daily BP when cuff available, provided BP log handout  Type 2 diabetes mellitus with diabetic neuropathy, unspecified (HCC) A1c 7.7 in 07/2022, deferred today. Weight loss of 15 pounds due to dietary changes. -Needs A1c at next visit -CGM monitoring with Dexcom when available -Continue currently regimen Lantus 44U, not interested in medication adjustments at this time -Due for ACR, BMP, lipid panel in  03/2023  Peripheral edema Stable on Lasix  daily. Echo with grade 1 diastolic dysfunction, manage with BP control as above. Primarily lymphedema with minimal pitting edema bilaterally on exam.  Knee osteoarthritis Seeing Dr. Jennette Kettle for consultation of knee injection 4/19.  Healthcare maintenance TDaP rx sent to pharmacy    Dr. Elberta Fortis, DO [facility]

## 2022-10-15 ENCOUNTER — Ambulatory Visit (INDEPENDENT_AMBULATORY_CARE_PROVIDER_SITE_OTHER): Payer: Medicare Other | Admitting: Family Medicine

## 2022-10-15 VITALS — BP 138/70 | HR 93 | Ht 62.0 in | Wt 217.0 lb

## 2022-10-15 DIAGNOSIS — M170 Bilateral primary osteoarthritis of knee: Secondary | ICD-10-CM

## 2022-10-15 DIAGNOSIS — Z23 Encounter for immunization: Secondary | ICD-10-CM

## 2022-10-15 DIAGNOSIS — M79609 Pain in unspecified limb: Secondary | ICD-10-CM | POA: Diagnosis not present

## 2022-10-15 DIAGNOSIS — Z794 Long term (current) use of insulin: Secondary | ICD-10-CM | POA: Diagnosis not present

## 2022-10-15 DIAGNOSIS — Z0000 Encounter for general adult medical examination without abnormal findings: Secondary | ICD-10-CM | POA: Diagnosis not present

## 2022-10-15 DIAGNOSIS — E1165 Type 2 diabetes mellitus with hyperglycemia: Secondary | ICD-10-CM | POA: Diagnosis present

## 2022-10-15 DIAGNOSIS — R600 Localized edema: Secondary | ICD-10-CM

## 2022-10-15 DIAGNOSIS — E1140 Type 2 diabetes mellitus with diabetic neuropathy, unspecified: Secondary | ICD-10-CM

## 2022-10-15 DIAGNOSIS — I10 Essential (primary) hypertension: Secondary | ICD-10-CM | POA: Diagnosis not present

## 2022-10-15 DIAGNOSIS — B351 Tinea unguium: Secondary | ICD-10-CM | POA: Diagnosis not present

## 2022-10-15 MED ORDER — FUROSEMIDE 20 MG PO TABS
30.0000 mg | ORAL_TABLET | Freq: Every day | ORAL | 2 refills | Status: DC
Start: 2022-10-15 — End: 2023-02-12

## 2022-10-15 MED ORDER — TETANUS-DIPHTH-ACELL PERTUSSIS 5-2.5-18.5 LF-MCG/0.5 IM SUSP: 0.5000 mL | Freq: Once | INTRAMUSCULAR | 0 refills | Status: AC

## 2022-10-15 NOTE — Assessment & Plan Note (Addendum)
Stable on Lasix  daily. Echo with grade 1 diastolic dysfunction, manage with BP control as above. Primarily lymphedema with minimal pitting edema bilaterally on exam.

## 2022-10-15 NOTE — Patient Instructions (Addendum)
It was wonderful to see you today! Thank you for choosing [facility].   Please bring ALL of your medications with you to every visit.   Today we talked about:  It was wonderful to see you today! Keep up the good work with weight loss. Your blood pressure looks good today, I would love for you to keep a home log whenever you find your cuff. I included a log below. We can check your A1c again in 3 months to monitor your diabetes. I sent in a referral to podiatry to help with your nails. Our office will follow up with you about this. I sent in a prescription for the TDaP vaccine to your pharmacy to get at your convenience.  Please follow up in 3 months   Call the clinic at [phone] if your symptoms worsen or you have any concerns.  Please be sure to schedule follow up at the front desk before you leave today.   Elberta Fortis, DO Family Medicine    Blood Pressure Record Sheet To take your blood pressure, you will need a blood pressure machine. You can buy a blood pressure machine (blood pressure monitor) at your clinic, drug store, or online. When choosing one, consider: An automatic monitor that has an arm cuff. A cuff that wraps snugly around your upper arm. You should be able to fit only one finger between your arm and the cuff. A device that stores blood pressure reading results. Do not choose a monitor that measures your blood pressure from your wrist or finger. Follow your health care provider's instructions for how to take your blood pressure. To use this form: Take your blood pressure medications every day These measurements should be taken when you have been at rest for at least 10-15 min Take at least 2 readings with each blood pressure check. This makes sure the results are correct. Wait 1-2 minutes between measurements. Write down the results in the spaces on this form. Keep in mind it should always be recorded systolic over diastolic. Both numbers are  important.  Repeat this every day for 2-3 weeks, or as told by your health care provider.  Make a follow-up appointment with your health care provider to discuss the results.  Blood Pressure Log Date Medications taken? (Y/N) Blood Pressure Time of Day

## 2022-10-15 NOTE — Assessment & Plan Note (Addendum)
A1c 7.7 in 07/2022, deferred today. Weight loss of 15 pounds due to dietary changes. -Needs A1c at next visit -CGM monitoring with Dexcom when available -Continue currently regimen Lantus 44U, not interested in medication adjustments at this time -Due for ACR, BMP, lipid panel in 03/2023

## 2022-10-15 NOTE — Assessment & Plan Note (Signed)
138/70 today, improved from prior visits. Compliant on Losartan , continue current regimen. Not checking at home since cannot find cuff -Recommend checking daily BP when cuff available, provided BP log handout

## 2022-10-15 NOTE — Assessment & Plan Note (Signed)
TDaP rx sent to pharmacy

## 2022-10-15 NOTE — Assessment & Plan Note (Signed)
Seeing Dr. Jennette Kettle for consultation of knee injection 4/19.

## 2022-10-16 ENCOUNTER — Ambulatory Visit: Payer: 59

## 2022-10-16 ENCOUNTER — Other Ambulatory Visit: Payer: Self-pay | Admitting: Family Medicine

## 2022-10-16 DIAGNOSIS — R600 Localized edema: Secondary | ICD-10-CM

## 2022-10-17 ENCOUNTER — Ambulatory Visit (INDEPENDENT_AMBULATORY_CARE_PROVIDER_SITE_OTHER): Payer: Medicare Other | Admitting: Family Medicine

## 2022-10-17 ENCOUNTER — Encounter: Payer: Self-pay | Admitting: Family Medicine

## 2022-10-17 VITALS — BP 163/49 | Ht 62.0 in | Wt 217.0 lb

## 2022-10-17 DIAGNOSIS — M170 Bilateral primary osteoarthritis of knee: Secondary | ICD-10-CM | POA: Diagnosis present

## 2022-10-17 MED ORDER — METHYLPREDNISOLONE ACETATE 40 MG/ML IJ SUSP
40.0000 mg | Freq: Once | INTRAMUSCULAR | Status: AC
  Administered 2022-10-17: 40 mg via INTRA_ARTICULAR

## 2022-10-17 NOTE — Progress Notes (Signed)
[facility]: Attending Note: I have examined the patient, reviewed the chart, discussed the assessment and plan with the Sports Medicine Fellow. I agree with assessment and treatment plan as detailed in the Fellow's note. Long history of bilateral DJD of the knee.  We have not gotten x-rays in quite a while but I do not think she is looking at any surgical intervention at this time so I do not think they would be beneficial.  Corticosteroid injections bilaterally today.  Follow-up as needed.  I also discussed lower extremity edema.  She is evidently having some problems with that and we discussed compression stockings.  Noted elevated blood pressure today.  It did improve on recheck.  I would recommend she follow-up with PCP in the near future. I will route this note with an addendum to her PCP.  Her last office visit at family medicine had blood pressure that was better controlled but she has had a couple that were elevated.  Potentially consider ambulatory blood pressure monitor.

## 2022-10-17 NOTE — Patient Instructions (Signed)
[email or web address]   For compression stockings

## 2022-10-17 NOTE — Progress Notes (Signed)
  CARRIE USERY - 82 y.o. female MRN [id number]  Date of birth: February 27, 1941    CHIEF COMPLAINT:   Bilateral knee pain    SUBJECTIVE:   HPI:  Pleasant 82 year old female with history of bilateral knee OA, type 2 diabetes on insulin, bilateral lymphedema comes clinic to be evaluated for bilateral knee pain.  She has known history of end-stage osteoarthritis of both knees.  She manages this with intermittent cortisone shots.  Her last injection was a over a year ago.  She historically has done really well with cortisone shots and gets a number of months of relief from them.  Her knees have been aching bilaterally for the last 3 weeks or so now and wants to get repeat cortisone injections.    Her chronic medical conditions are otherwise stable.  She has lymphedema but has not been able to find good compression stockings.  Her last hemoglobin A1c was 7.7.  ROS:     See HPI  PERTINENT  PMH / PSH FH / / SH:  Past Medical, Surgical, Social, and Family History Reviewed & Updated in the EMR.  Pertinent findings include:  See abov  OBJECTIVE: BP (!) 163/49   Ht  (1.575 m)   Wt 217 lb (98.4 kg)   BMI 39.69 kg/m   Physical Exam:  Vital signs are reviewed.  GEN: Alert and oriented, NAD Pulm: Breathing unlabored PSY: normal mood, congruent affect  MSK: Bilateral knees -no overlying erythema.  Tender to palpation at medial and lateral joint lines bilaterally.  Nontender palpation over the patella.  Range of motion is 5 to 90 degrees in extension/flexion bilaterally.  4/5 strength with resisted knee extension bilaterally.  1+ crepitus bilaterally.  ASSESSMENT & PLAN:  1.  Bilateral knee osteoarthritis  -Will proceed with a bilateral corticosteroid injections into the knees today.  She tolerated the procedure well.  Advised to watch for elevation of sugar after the injection.  She will also get compression stockings for her lymphedema from [place].  All questions answered and she agrees to  plan.  She will follow-up as needed.  Procedure performed:  Bilateral knee intraarticular corticosteroid injection; palpation guided  Consent obtained and verified. Time-out conducted. Noted no overlying erythema, induration, or other signs of local infection. The bilateral medial joint spaces were palpated and marked. The overlying skin was prepped in a sterile fashion. Topical analgesic spray: Ethyl chloride. Needle: 22 gauge, 1.5 inch Meds: 40 mg methylrprednisolone, 4 ml 1% lidocaine without epinephrine  Completed without difficulty.  Advised to call if fevers/chills, erythema, induration, drainage, or persistent bleeding.   Arvella Nigh, MD PGY-4, Sports Medicine Fellow [facility]

## 2022-10-20 ENCOUNTER — Encounter: Payer: Self-pay | Admitting: Family Medicine

## 2022-10-22 ENCOUNTER — Telehealth: Payer: Self-pay

## 2022-10-22 NOTE — Telephone Encounter (Signed)
Received call from [facility] [employer] regarding Dexcom order form.   Dr. Ardyth Harps completed form, however, attending needs to complete due to provider needing to be enrolled in [employer].   Dr. Manson Passey signed paperwork. Included NPI and faxed back to [facility].   Veronda Prude, RN

## 2022-10-27 ENCOUNTER — Telehealth: Payer: Self-pay | Admitting: Family Medicine

## 2022-10-27 NOTE — Telephone Encounter (Signed)
Contacted Nigel Mormon to schedule their annual wellness visit. Appointment made for 10/31/2022.  Thank you,  [facility] Support [facility] Direct dial  [phone]

## 2022-10-31 ENCOUNTER — Ambulatory Visit (INDEPENDENT_AMBULATORY_CARE_PROVIDER_SITE_OTHER): Payer: Medicare Other

## 2022-10-31 DIAGNOSIS — Z0000 Encounter for general adult medical examination without abnormal findings: Secondary | ICD-10-CM

## 2022-10-31 NOTE — Progress Notes (Signed)
I connected with  Katherine Christensen on 10/31/22 by a audio enabled telemedicine application and verified that I am speaking with the correct person using two identifiers.  Patient Location: Home  Provider Location: Home Office  I discussed the limitations of evaluation and management by telemedicine. The patient expressed understanding and agreed to proceed.   Subjective:   Katherine Christensen is a 82 y.o. female who presents for an Initial Medicare Annual Wellness Visit.  Review of Systems    Per HPI unless specifically indicated below.  Cardiac Risk Factors include: advanced age (>39men, >25 women);female gender, Essential Hypertension, and Hyperlipidemia.          Objective:       10/17/2022    9:55 AM 10/17/2022    9:18 AM 10/15/2022    9:44 AM  Vitals with BMI  Height  5\' 2"  5\' 2"   Weight  217 lbs 217 lbs  BMI  39.68 39.68  Systolic 163 170 454  Diastolic 49 63 70  Pulse   93    Today's Vitals   10/31/22 1504  PainSc: 0-No pain   There is no height or weight on file to calculate BMI.     10/15/2022    9:45 AM 08/07/2022    2:07 PM 07/17/2022    5:02 PM 11/23/2013    8:34 AM 10/11/2013    9:45 AM  Advanced Directives  Does Patient Have a Medical Advance Directive? No No No Patient does not have advance directive Patient does not have advance directive;Patient would like information  Would patient like information on creating a medical advance directive? No - Patient declined No - Patient declined       Current Medications (verified) Outpatient Encounter Medications as of 10/31/2022  Medication Sig   ACCU-CHEK FASTCLIX LANCETS MISC 1 each by Does not apply route 3 (three) times daily. Check sugar 10 x daily   acetaminophen (TYLENOL) 500 MG tablet Take 500 mg by mouth as needed.    atorvastatin (LIPITOR) 40 MG tablet Take 1 tablet (40 mg total) by mouth daily.   Blood Glucose Monitoring Suppl (ACCU-CHEK NANO SMARTVIEW) W/DEVICE KIT 1 kit by Does not apply route 3 (three)  times daily.   Continuous Blood Gluc Receiver (DEXCOM G7 RECEIVER) DEVI Please use to check blood sugar levels. E11.4   Continuous Blood Gluc Sensor (DEXCOM G7 SENSOR) MISC Please use to check blood sugar levels. Sensor will need to be replaced every 10 days. E11.4   Continuous Glucose Monitor Sup KIT 1 each by Does not apply route daily.   furosemide (LASIX) 20 MG tablet Take 1.5 tablets (30 mg total) by mouth daily.   gabapentin (NEURONTIN) 100 MG capsule Take 1 capsule (100 mg total) by mouth at bedtime.   glucose blood (ACCU-CHEK AVIVA PLUS) test strip 1 each by Other route 3 (three) times daily. Use as instructed   insulin glargine (LANTUS) 100 UNIT/ML Solostar Pen Inject 44 Units into the skin every morning.   Insulin Pen Needle (B-D ULTRAFINE III SHORT PEN) 31G X 8 MM MISC Dispense amount sufficient for one daily shot.   losartan (COZAAR) 100 MG tablet Take 0.5 tablets (50 mg total) by mouth daily.   Olopatadine HCl 0.2 % SOLN 1 drop each affected eye once daily   No facility-administered encounter medications on file as of 10/31/2022.    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 Qualifier: Diagnosis of  By: Shawnie Pons MD, Tanya      Cataract    History of MI (myocardial infarction) 1990s   Not treated at the time, noted q waves on ECG later   Hypertension    MRSA (methicillin resistant staph aureus) culture positive    Obesity    Osteoarthritis    Bilateral knees   Sleep apnea    Not on CPAP   Type 2 diabetes mellitus (HCC)    Past Surgical History:  Procedure Laterality Date   BREAST BIOPSY     DILATION AND CURETTAGE OF UTERUS     KNEE ARTHROSCOPY     LAPAROSCOPY     OOPHORECTOMY     Both ovary removed   VAGINAL HYSTERECTOMY  06/30/1980   For fibroids   Family History   Problem Relation Age of Onset   Alcohol abuse Father    Cancer Sister        Breast/lung cancer   Heart disease Brother    Cancer Maternal Grandmother        throat cancer   Social History   Socioeconomic History   Marital status: Widowed    Spouse name: Not on file   Number of children: 1   Years of education: B.S.   Highest education level: Bachelor's degree (e.g., BA, AB, BS)  Occupational History   Occupation: Training and development officer: [employer]    Comment: Psychologist, forensic   Occupation: Retired  Tobacco Use   Smoking status: Former    Packs/day: 1.50    Years: 25.00    Additional pack years: 0.00    Total pack years: 37.50    Types: Cigarettes    Start date: 07/31/1963    Quit date: 07/30/1988    Years since quitting: 34.2   Smokeless tobacco: Not on file  Vaping Use   Vaping Use: Never used  Substance and Sexual Activity   Alcohol use: Never   Drug use: Never   Sexual activity: Not Currently  Other Topics Concern   Not on file  Social History Narrative   The patient is retired, single, a former smoker and quit quit 18 years ago, patient denies alcohol use,  and does child work occasionally. [name] currently has custody of a three year old child whose mother is addicted to drugs. 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       Health Care POA:    Emergency Contact: Rayford Halsted, [phone]   End of Life Plan:    Who lives with you: self   Any pets: none   Diet: Pt has a varied diet of protein, starch, and vegetables.   Exercise: Pt has no regular exercise routine.   Seatbelts: Pt reports wearing seatbelt when in vehicles.    Hobbies: watching investigation channel.             Social Determinants of Health   Financial Resource Strain: Low Risk  (10/31/2022)   Overall Financial Resource Strain (CARDIA)    Difficulty of Paying Living Expenses: Not hard at all  Food Insecurity: No Food Insecurity (10/31/2022)   Hunger Vital  Sign    Worried About Running Out of Food in the Last Year: Never true    Ran Out of Food in the Last Year: Never true  Transportation Needs: No Transportation Needs (10/15/2022)   PRAPARE - Transportation  Lack of Transportation (Medical): No    Lack of Transportation (Non-Medical): No  Physical Activity: Insufficiently Active (10/31/2022)   Exercise Vital Sign    Days of Exercise per Week: 1 day    Minutes of Exercise per Session: 10 min  Stress: No Stress Concern Present (10/15/2022)   [employer] of Occupational Health - Occupational Stress Questionnaire    Feeling of Stress : Only a little  Social Connections: Moderately Integrated (10/15/2022)   Social Connection and Isolation Panel [NHANES]    Frequency of Communication with Friends and Family: More than three times a week    Frequency of Social Gatherings with Friends and Family: Twice a week    Attends Religious Services: More than 4 times per year    Active Member of [employer] or Organizations: Yes    Attends Banker Meetings: More than 4 times per year    Marital Status: Widowed    Tobacco Counseling Counseling given: No   Clinical Intake:  Pre-visit preparation completed: No  Pain : No/denies pain Pain Score: 0-No pain     Nutritional Status: BMI > 30  Obese Nutritional Risks: None Diabetes: Yes CBG done?: No Did pt. bring in CBG monitor from home?: No  How often do you need to have someone help you when you read instructions, pamphlets, or other written materials from your doctor or pharmacy?: 1 - Never  Diabetic?Nutrition Risk Assessment:  Has the patient had any N/V/D within the last 2 months?  No  Does the patient have any non-healing wounds?  No  Has the patient had any unintentional weight loss or weight gain?  Yes   Diabetes:  Is the patient diabetic?  Yes  If diabetic, was a CBG obtained today?  No  Did the patient bring in their glucometer from home?  No  How often do you  monitor your CBG's? Occasionally .   Financial Strains and Diabetes Management:  Are you having any financial strains with the device, your supplies or your medication? No .  Does the patient want to be seen by Chronic Care Management for management of their diabetes?  No  Would the patient like to be referred to a Nutritionist or for Diabetic Management?  No   Diabetic Exams:  Diabetic Eye Exam: Overdue for diabetic eye exam. Pt has been advised about the importance in completing this exam. Patient advised to call and schedule an eye exam. Diabetic Foot Exam: Overdue, Pt has been advised about the importance in completing this exam. Pt is scheduled for diabetic foot exam on next diabetic appt.   Interpreter Needed?: No  Information entered by :: Laurel Dimmer, CMA   Activities of Daily Living    10/31/2022    3:02 PM  In your present state of health, do you have any difficulty performing the following activities:  Hearing? 1  Vision? 1  Difficulty concentrating or making decisions? 0  Walking or climbing stairs? 1  Dressing or bathing? 0  Doing errands, shopping? 0    Patient Care Team: Elberta Fortis, MD as PCP - General (Family Medicine) Charlott Rakes, MD as Consulting Physician (Gastroenterology) Dorisann Frames, MD as Consulting Physician (Endocrinology) Nadyne Coombes, MD as Consulting Physician (Ophthalmology) Ralene Cork, DO (Sports Medicine) Teryl Lucy, MD (Orthopedic Surgery)  Indicate any recent Medical Services you may have received from other than [facility] providers in the past year (date may be approximate).     Assessment:   This is a routine wellness examination  for [employer].   Hearing/Vision screen Wear bilateral hearing aids. Denies any change to her vision. Wear glasses. Overdue   Dietary issues and exercise activities discussed: Current Exercise Habits: Home exercise routine, Time (Minutes): 15, Frequency (Times/Week): 1, Weekly  Exercise (Minutes/Week): 15, Intensity: Mild, Exercise limited by: orthopedic condition(s)   Goals Addressed   None    Depression Screen    10/31/2022    3:01 PM 10/15/2022    9:45 AM 07/17/2022    5:01 PM 10/11/2013    9:44 AM 04/14/2013    4:19 PM 01/05/2013   12:10 PM 04/06/2012    8:49 AM  PHQ 2/9 Scores  PHQ - 2 Score 0 0 0 0 0 0 0  PHQ- 9 Score  1 0        Fall Risk    10/31/2022    3:01 PM 10/15/2022    9:45 AM 10/11/2013    9:44 AM 04/14/2013    4:19 PM 01/05/2013   12:10 PM  Fall Risk   Falls in the past year? 0 0 No No No  Number falls in past yr: 0      Injury with Fall? 0      Risk for fall due to : No Fall Risks      Follow up Falls evaluation completed        FALL RISK PREVENTION PERTAINING TO THE HOME:  Any stairs in or around the home? No  If so, are there any without handrails? No  Home free of loose throw rugs in walkways, pet beds, electrical cords, etc? No  Adequate lighting in your home to reduce risk of falls? Yes   ASSISTIVE DEVICES UTILIZED TO PREVENT FALLS:  Life alert? No  Use of a cane, walker or w/c? Yes  Grab bars in the bathroom? No  Shower chair or bench in shower? Yes  Elevated toilet seat or a handicapped toilet? Yes   TIMED UP AND GO:  Was the test performed? Unable to perform, virtual appointment   Cognitive Function:    01/05/2013   12:00 PM 01/06/2012    2:00 PM  MMSE - Mini Mental State Exam  Orientation to time 5 5  Orientation to Place 5 5  Registration 3 3  Attention/ Calculation 5 5  Recall 2 3  Language- name 2 objects 2 2  Language- repeat 1 1  Language- follow 3 step command 3 3  Language- read & follow direction 1 1  Write a sentence 1 1  Copy design 1 1  Total score 29 30        10/31/2022    3:07 PM  6CIT Screen  What Year? 0 points  What month? 0 points  What time? 0 points  Count back from 20 0 points  Months in reverse 0 points  Repeat phrase 0 points  Total Score 0 points     Immunizations Immunization History  Administered Date(s) Administered   Influenza,inj,Quad PF,6+ Mos 03/15/2013   Pneumococcal Polysaccharide-23 08/08/2009    TDAP status: Up to date  Flu Vaccine status: Up to date  Pneumonia Vaccine: overdue  Covid-19 vaccine status: Information provided on how to obtain vaccines.   Qualifies for Shingles Vaccine? Yes   Zostavax completed No   Shingrix Completed?: No.    Education has been provided regarding the importance of this vaccine. Patient has been advised to call insurance company to determine out of pocket expense if they have not yet received this vaccine. Advised may  also receive vaccine at local pharmacy or Health Dept. Verbalized acceptance and understanding.  Screening Tests Health Maintenance  Topic Date Due   Hepatitis C Screening  Never done   DTaP/Tdap/Td (1 - Tdap) Never done   Zoster Vaccines- Shingrix (1 of 2) Never done   Diabetic kidney evaluation - Urine ACR  04/17/2010   Pneumonia Vaccine 41+ Years old (2 of 2 - PCV) 08/08/2010   Diabetic kidney evaluation - eGFR measurement  01/14/2014   LIPID PANEL  01/14/2014   OPHTHALMOLOGY EXAM  04/14/2014   FOOT EXAM  08/08/2014   COVID-19 Vaccine (3 - 2023-24 season) 02/28/2022   HEMOGLOBIN A1C  11/05/2022   INFLUENZA VACCINE  01/29/2023   Medicare Annual Wellness (AWV)  10/31/2023   DEXA SCAN  Completed   HPV VACCINES  Aged Out   COLONOSCOPY (Pts 45-73yrs Insurance coverage will need to be confirmed)  Discontinued    Health Maintenance  Health Maintenance Due  Topic Date Due   Hepatitis C Screening  Never done   DTaP/Tdap/Td (1 - Tdap) Never done   Zoster Vaccines- Shingrix (1 of 2) Never done   Diabetic kidney evaluation - Urine ACR  04/17/2010   Pneumonia Vaccine 40+ Years old (2 of 2 - PCV) 08/08/2010   Diabetic kidney evaluation - eGFR measurement  01/14/2014   LIPID PANEL  01/14/2014   OPHTHALMOLOGY EXAM  04/14/2014   FOOT EXAM  08/08/2014   COVID-19  Vaccine (3 - 2023-24 season) 02/28/2022    Colorectal cancer screening: No longer required.   Mammogram status: No longer required due to age.  DEXA Scan: 11/23/2012  Lung Cancer Screening: (Low Dose CT Chest recommended if Age 80-80 years, 30 pack-year currently smoking OR have quit w/in 15years.) does not qualify.   Lung Cancer Screening Referral: not applicable   Additional Screening:  Hepatitis C Screening: does qualify; overdue  Vision Screening: Recommended annual ophthalmology exams for early detection of glaucoma and other disorders of the eye. Is the patient up to date with their annual eye exam?  Yes  Who is the provider or what is the name of the office in which the patient attends annual eye exams? Dr. Mitzi Davenport If pt is not established with a provider, would they like to be referred to a provider to establish care? No .   Dental Screening: Recommended annual dental exams for proper oral hygiene  Community Resource Referral / Chronic Care Management: CRR required this visit?  No   CCM required this visit?  No      Plan:     I have personally reviewed and noted the following in the patient's chart:   Medical and social history Use of alcohol, tobacco or illicit drugs  Current medications and supplements including opioid prescriptions. Patient is not currently taking opioid prescriptions. Functional ability and status Nutritional status Physical activity Advanced directives List of other physicians Hospitalizations, surgeries, and ER visits in previous 12 months Vitals Screenings to include cognitive, depression, and falls Referrals and appointments  In addition, I have reviewed and discussed with patient certain preventive protocols, quality metrics, and best practice recommendations. A written personalized care plan for preventive services as well as general preventive health recommendations were provided to patient.     Ms. Barry Dienes , Thank you for  taking time to come for your Medicare Wellness Visit. I appreciate your ongoing commitment to your health goals. Please review the following plan we discussed and let me know if I can assist you in  the future.   These are the goals we discussed:  Goals      HEMOGLOBIN A1C < 7.0        This is a list of the screening recommended for you and due dates:  Health Maintenance  Topic Date Due   Hepatitis C Screening: USPSTF Recommendation to screen - Ages 81-79 yo.  Never done   DTaP/Tdap/Td vaccine (1 - Tdap) Never done   Zoster (Shingles) Vaccine (1 of 2) Never done   Yearly kidney health urinalysis for diabetes  04/17/2010   Pneumonia Vaccine (2 of 2 - PCV) 08/08/2010   Yearly kidney function blood test for diabetes  01/14/2014   Lipid (cholesterol) test  01/14/2014   Eye exam for diabetics  04/14/2014   Complete foot exam   08/08/2014   COVID-19 Vaccine (3 - 2023-24 season) 02/28/2022   Hemoglobin A1C  11/05/2022   Flu Shot  01/29/2023   Medicare Annual Wellness Visit  10/31/2023   DEXA scan (bone density measurement)  Completed   HPV Vaccine  Aged Out   Colon Cancer Screening  Discontinued    Lonna Cobb, [facility]   10/31/2022   Nurse Notes: Approximately 30 minute Non-Face -To-Face Medicare Wellness Visit

## 2022-10-31 NOTE — Patient Instructions (Signed)

## 2022-11-03 ENCOUNTER — Ambulatory Visit: Payer: Medicare Other | Admitting: Podiatry

## 2022-11-18 ENCOUNTER — Ambulatory Visit: Payer: Medicare Other | Admitting: Podiatry

## 2022-11-20 ENCOUNTER — Ambulatory Visit: Payer: Medicare Other | Admitting: Pharmacist

## 2022-11-21 ENCOUNTER — Telehealth: Payer: Self-pay

## 2022-11-21 ENCOUNTER — Ambulatory Visit: Payer: Medicare Other

## 2022-11-21 ENCOUNTER — Ambulatory Visit: Payer: Medicare Other | Admitting: Pharmacist

## 2022-11-21 NOTE — Telephone Encounter (Signed)
Patient calls nurse line reporting exposure to Covid.   She reports she was exposed on Wednesday. She reports she found out today.   She reports she does have a cough and slight "tickle" in her throat. She denies any congestion, fevers, headaches, diarrhea or nausea/vomiting.   Patient advised the best option would be to home test. Patient reports she will test herself and call back if positive.   Patient advised of conservative measure.   Will await test result.

## 2022-11-25 ENCOUNTER — Ambulatory Visit: Payer: Medicare Other | Admitting: Podiatry

## 2022-12-01 ENCOUNTER — Ambulatory Visit (INDEPENDENT_AMBULATORY_CARE_PROVIDER_SITE_OTHER): Payer: Medicare Other | Admitting: Pharmacist

## 2022-12-01 ENCOUNTER — Encounter: Payer: Self-pay | Admitting: Pharmacist

## 2022-12-01 VITALS — BP 158/72 | Ht 62.0 in | Wt 220.2 lb

## 2022-12-01 DIAGNOSIS — E1159 Type 2 diabetes mellitus with other circulatory complications: Secondary | ICD-10-CM | POA: Diagnosis not present

## 2022-12-01 DIAGNOSIS — I10 Essential (primary) hypertension: Secondary | ICD-10-CM

## 2022-12-01 DIAGNOSIS — I152 Hypertension secondary to endocrine disorders: Secondary | ICD-10-CM

## 2022-12-01 DIAGNOSIS — Z794 Long term (current) use of insulin: Secondary | ICD-10-CM

## 2022-12-01 DIAGNOSIS — E1140 Type 2 diabetes mellitus with diabetic neuropathy, unspecified: Secondary | ICD-10-CM | POA: Diagnosis not present

## 2022-12-01 MED ORDER — BD PEN NEEDLE SHORT U/F 31G X 8 MM MISC
99 refills | Status: AC
Start: 2022-12-01 — End: ?

## 2022-12-01 NOTE — Progress Notes (Signed)
Reviewed and agree with Dr Koval's plan.   

## 2022-12-01 NOTE — Patient Instructions (Signed)
Blood Pressure Activity Diary Time Lying down/ Sleeping Walking/ Exercise Stressed/ Angry Headache/ Pain Dizzy  9 AM       10 AM       11 AM       12 PM       1 PM       2 PM       Time Lying down/ Sleeping Walking/ Exercise Stressed/ Angry Headache/ Pain Dizzy  3 PM       4 PM        5 PM       6 PM       7 PM       8 PM       Time Lying down/ Sleeping Walking/ Exercise Stressed/ Angry Headache/ Pain Dizzy  9 PM       10 PM       11 PM       12 AM       1 AM       2 AM       3 AM       Time Lying down/ Sleeping Walking/ Exercise Stressed/ Angry Headache/ Pain Dizzy  4 AM       5 AM       6 AM       7 AM       8 AM       9 AM       10 AM        Time you woke up: _________                  Time you went to sleep:__________  Come back tomorrow at 8:30 to have the monitor removed Call the [facility] if you have any questions before then ([phone])  Wearing the Blood Pressure Monitor The cuff will inflate every 20 minutes during the day and every 30 minutes while you sleep. Your blood pressure readings will NOT display after cuff inflation Fill out the blood pressure-activity diary during the day, especially during activities that may affect your reading -- such as exercise, stress, walking, taking your blood pressure medications  Important things to know: Avoid taking the monitor off for the next 24 hours, unless it causes you discomfort or pain. Do NOT get the monitor wet and do NOT dry to clean the monitor with any cleaning products. Do NOT put the monitor on anyone else's arm. When the cuff inflates, avoid excess movement. Let the cuffed arm hang loosely, slightly away from the body. Avoid flexing the muscles or moving the hand/fingers. When you go to sleep, make sure that the hose is not kinked. Remember to fill out the blood pressure activity diary. If you experience severe pain or unusual pain (not associated with getting your blood pressure  checked), remove the monitor.  Troubleshooting:  Code  Troubleshooting   1  Check cuff position, tighten cuff   2, 3  Remain still during reading   4, 87  Check air hose connections and make sure cuff is tight   85, 89  Check hose connections and make tubing is not crimped   86  Push START/STOP to restart reading   88, 91  Retry by pushing START/STOP   90  Replace batteries. If problem persists, remove monitor and bring back to   clinic at follow up   97, 98, 99  Service required - Remove monitor and bring back to clinic at   follow up    

## 2022-12-01 NOTE — Assessment & Plan Note (Signed)
History of hypertension diagnosed in 2007.  Currently taking losartan daily with goal presssure of <150 or possibly lower given wide pulse pressure.      -Placed blood pressure cuff, provided education, patient instructed to wear cuff for 24 hours and return tomorrow to review results. Marland Kitchen

## 2022-12-01 NOTE — Progress Notes (Signed)
S:     Chief Complaint  Patient presents with   Medication Management    Amb BP Monitor Day 1 + CGM set-up   82 y.o. female who presents for hypertension evaluation, education, and management.  PMH is significant for gout and peripheral edema.  Patient was referred and last seen by Primary Care Provider, Dr. Ardyth Harps, on 10/15/2022.   At last visit, patient was prescribed a DEXCOM G7 CGM and requests assistance with placement.   Diagnosed with Hypertension "many years ago".    Medication compliance is reported to be good.  Discussed procedure for wearing the monitor and gave patient written instructions. Monitor was placed on non-dominant arm (left wrist) with instructions to return in the morning.   Current BP Medications include:  losartan 50mg  (takes 1/2 of 100mg  tablet)  Antihypertensives tried in the past include: lisinopril, and HCTZ    O:  Review of Systems  Musculoskeletal:  Positive for joint pain.  All other systems reviewed and are negative.   Physical Exam Constitutional:      Appearance: Normal appearance. She is obese.  Pulmonary:     Effort: Pulmonary effort is normal.  Neurological:     Mental Status: She is alert.  Psychiatric:        Mood and Affect: Mood normal.        Behavior: Behavior normal.        Thought Content: Thought content normal.     Last 3 Office BP readings: BP Readings from Last 3 Encounters:  12/01/22 (!) 158/72  10/17/22 (!) 163/49  10/15/22 138/70    Clinical Atherosclerotic Cardiovascular Disease (ASCVD): Yes  The 10-year ASCVD risk score (Arnett DK, et al., 2019) is: 47.7%   Values used to calculate the score:     Age: 77 years     Sex: Female     Is Non-Hispanic African American: Yes     Diabetic: Yes     Tobacco smoker: No     Systolic Blood Pressure: 158 mmHg     Is BP treated: Yes     HDL Cholesterol: 57 mg/dL     Total Cholesterol: 203 mg/dL  Basic Metabolic Panel    Component Value Date/Time   NA 137  01/14/2013 0844   K 4.2 01/14/2013 0844   CL 102 01/14/2013 0844   CO2 26 01/14/2013 0844   GLUCOSE 191 (H) 01/14/2013 0844   BUN 8 01/14/2013 0844   CREATININE 0.53 01/14/2013 0844   CALCIUM 9.2 01/14/2013 0844   GFRNONAA >60 07/06/2009 0927   GFRAA  07/06/2009 0927    >60        The eGFR has been calculated using the MDRD equation. This calculation has not been validated in all clinical situations. eGFR's persistently <60 mL/min signify possible Chronic Kidney Disease.    Renal function: CrCl cannot be calculated (Patient's most recent lab result is older than the maximum 21 days allowed.).   ABPM Study Data: Arm Placement left arm   For Office Goal Goal BP of <140 however it may be < 150 mmHg this patient may be able to tolerate low diastolic readings with high pulse pressures.    A/P: History of hypertension diagnosed in 2007.  Currently taking losartan daily with goal presssure of <150 or possibly lower given wide pulse pressure.      -Placed blood pressure cuff, provided education, patient instructed to wear cuff for 24 hours and return tomorrow to review results.  Dexcom G7 Set-up  completed including receiver and sensor linking.  Follow-up tomorrow at BP visits for any additional questions.  -Provided refill for insulin pen needles at patient request.    Written patient instructions provided including activity/symptom/event log. Patient verbalized understanding of plan. Total time in face to face counseling 23 minutes.    Follow-up: Tomorrow AM - early morning appointment  Patient seen with Lily Peer, PharmD, PGY-1 resident.

## 2022-12-01 NOTE — Assessment & Plan Note (Signed)
Dexcom G7 Set-up completed including receiver and sensor linking.  Follow-up tomorrow at BP visits for any additional questions.

## 2022-12-02 ENCOUNTER — Ambulatory Visit (INDEPENDENT_AMBULATORY_CARE_PROVIDER_SITE_OTHER): Payer: Medicare Other | Admitting: Pharmacist

## 2022-12-02 ENCOUNTER — Encounter: Payer: Self-pay | Admitting: Pharmacist

## 2022-12-02 VITALS — BP 155/68 | HR 72 | Wt 220.4 lb

## 2022-12-02 DIAGNOSIS — E1140 Type 2 diabetes mellitus with diabetic neuropathy, unspecified: Secondary | ICD-10-CM

## 2022-12-02 DIAGNOSIS — I152 Hypertension secondary to endocrine disorders: Secondary | ICD-10-CM | POA: Diagnosis not present

## 2022-12-02 DIAGNOSIS — E1159 Type 2 diabetes mellitus with other circulatory complications: Secondary | ICD-10-CM

## 2022-12-02 DIAGNOSIS — Z794 Long term (current) use of insulin: Secondary | ICD-10-CM | POA: Diagnosis not present

## 2022-12-02 MED ORDER — LOSARTAN POTASSIUM 100 MG PO TABS
100.0000 mg | ORAL_TABLET | Freq: Every day | ORAL | 11 refills | Status: DC
Start: 2022-12-02 — End: 2023-02-13

## 2022-12-02 NOTE — Assessment & Plan Note (Signed)
Hypertension diagnosed "many years ago" currently found to have Isolated systolic hypertension on current medications. BP goal <150 or possibly lower given wide pulse pressure.   Medication adherence appears good. Control is suboptimal due to the challenge of low diastolic readings < 70 routinely and as low as 45-50 overnight.  Patient denies dizziness at any time of day including when awakening at night.  -Increased dose of losartan from 50mg  to 100mg  by asking patient to take full tablet daily.  -Patient educated on purpose, proper use, and potential adverse effects of dizziness.  Patient aware to decrease back to 1/2 daily if dizzy.

## 2022-12-02 NOTE — Progress Notes (Signed)
Reviewed and agree with Dr Koval's plan.   

## 2022-12-02 NOTE — Progress Notes (Signed)
S:     Chief Complaint  Patient presents with   Medication Management    Amb BP Monitoring - Day 2   82 y.o. female who presents for hypertension evaluation, education, and management.  PMH is significant for gout and peripheral edema.  Patient was referred and last seen by Primary Care Provider, Dr. Ardyth Harps, on 10/15/22.    Today, patient arrives in good spirits and presents with assistance of a walker. Denies dizziness, headache, blurred vision, swelling.    Current antihypertensives include: losartan 50 mg (takes 1/2 of the 100 mg tablet)  Antihypertensives tried in the past include: lisinopril and HCTZ   Patient-reported exercise habits: limited by mobility and use of "walker"    O:  Review of Systems  Musculoskeletal:  Positive for joint pain.  All other systems reviewed and are negative.   Physical Exam Constitutional:      Appearance: Normal appearance.  Pulmonary:     Effort: Pulmonary effort is normal.  Neurological:     Mental Status: She is alert.  Psychiatric:        Mood and Affect: Mood normal.        Behavior: Behavior normal.        Thought Content: Thought content normal.        Judgment: Judgment normal.     Last 3 Office BP readings: BP Readings from Last 3 Encounters:  12/01/22 (!) 158/72  10/17/22 (!) 163/49  10/15/22 138/70    BMET    Component Value Date/Time   NA 137 01/14/2013 0844   K 4.2 01/14/2013 0844   CL 102 01/14/2013 0844   CO2 26 01/14/2013 0844   GLUCOSE 191 (H) 01/14/2013 0844   BUN 8 01/14/2013 0844   CREATININE 0.53 01/14/2013 0844   CALCIUM 9.2 01/14/2013 0844   GFRNONAA >60 07/06/2009 0927   GFRAA  07/06/2009 0927    >60        The eGFR has been calculated using the MDRD equation. This calculation has not been validated in all clinical situations. eGFR's persistently <60 mL/min signify possible Chronic Kidney Disease.    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 BP goal <150 or possibly lower given wide pulse pressure.   Medication adherence appears good. Control is suboptimal due to the challenge of low diastolic readings < 70 routinely and as low as 45-50 overnight.  Patient denies dizziness at any time of day including when awakening at night.  -Increased dose of losartan from 50mg  to 100mg  by asking patient to take full tablet daily.  -Patient educated on purpose, proper use, and potential adverse effects of dizziness.  Patient aware to decrease back to 1/2 daily if dizzy.   Results reviewed and written information provided.    Next blood draw, please consider LIPID panel and Uric acid level to assess disease control of hyperlipidemia and risk of gout flare.  Written patient instructions provided. Patient verbalized understanding of treatment plan.  Total time in face to face counseling 13 minutes.    Follow-up:  Pharmacist PRN. PCP clinic visit in 1 month.  Patient seen with Lily Peer, PharmD, PGY-1 resident and Frederic Jericho, PharmD Candidate.

## 2022-12-02 NOTE — Patient Instructions (Addendum)
It was nice to see you today!  Your goal blood pressure is <140/90 mmHg.  Medication Changes: Increase Losartan to 100 mg daily (one full tablet)  Monitor blood pressure at home daily and keep a log (on your phone or piece of paper) to bring with you to your next visit. Write down date, time, blood pressure and pulse.  Keep up the good work with diet and exercise. Aim for a diet full of vegetables, fruit and lean meats (chicken, [place], fish). Try to limit salt intake by eating fresh or frozen vegetables (instead of canned), rinse canned vegetables prior to cooking and do not add any additional salt to meals.

## 2022-12-03 ENCOUNTER — Ambulatory Visit
Admission: RE | Admit: 2022-12-03 | Discharge: 2022-12-03 | Disposition: A | Discharge status: Home / Self Care | Payer: Medicare Other | Source: Ambulatory Visit | Attending: Family Medicine | Admitting: Family Medicine

## 2022-12-03 ENCOUNTER — Encounter: Payer: Self-pay | Admitting: Podiatry

## 2022-12-03 ENCOUNTER — Other Ambulatory Visit: Payer: Self-pay | Admitting: Family Medicine

## 2022-12-03 ENCOUNTER — Ambulatory Visit (INDEPENDENT_AMBULATORY_CARE_PROVIDER_SITE_OTHER): Payer: Medicare Other | Admitting: Podiatry

## 2022-12-03 DIAGNOSIS — Z1231 Encounter for screening mammogram for malignant neoplasm of breast: Secondary | ICD-10-CM

## 2022-12-03 DIAGNOSIS — Z794 Long term (current) use of insulin: Secondary | ICD-10-CM

## 2022-12-03 DIAGNOSIS — E1140 Type 2 diabetes mellitus with diabetic neuropathy, unspecified: Secondary | ICD-10-CM

## 2022-12-03 DIAGNOSIS — M79675 Pain in left toe(s): Secondary | ICD-10-CM

## 2022-12-03 DIAGNOSIS — B351 Tinea unguium: Secondary | ICD-10-CM

## 2022-12-03 DIAGNOSIS — R600 Localized edema: Secondary | ICD-10-CM

## 2022-12-03 DIAGNOSIS — M79674 Pain in right toe(s): Secondary | ICD-10-CM

## 2022-12-03 NOTE — Progress Notes (Signed)
  Subjective:  Patient ID: Katherine Christensen, female    DOB: 06-Sep-1940,   MRN: [id number]  Chief Complaint  Patient presents with   Nail Problem    Routine foot care    82 y.o. female presents for concern of thickened elongated and painful nails that are difficult to trim. Requesting to have them trimmed today. Relates burning and tingling in their feet. Patient is diabetic and last A1c was  Lab Results  Component Value Date   HGBA1C 7.7 (A) 08/07/2022   .   PCP:  Elberta Fortis, MD    . Denies any other pedal complaints. Denies n/v/f/c.   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 Qualifier: Diagnosis of  By: Shawnie Pons MD, Tanya      Cataract    History of MI (myocardial infarction) 1990s   Not treated at the time, noted q waves on ECG later   Hypertension    MRSA (methicillin resistant staph aureus) culture positive    Obesity    Osteoarthritis    Bilateral knees   Sleep apnea    Not on CPAP   Type 2 diabetes mellitus (HCC)     Objective:  Physical Exam: Vascular: DP/PT pulses 2/4 bilateral. CFT <3 seconds. Absent hair growth on digits. Edema noted to bilateral lower extremities. Xerosis noted bilaterally.  Skin. No lacerations or abrasions bilateral feet. Nails 1-5 bilateral  are thickened discolored and elongated with subungual debris.  Musculoskeletal: MMT 5/5 bilateral lower extremities in DF, PF, Inversion and Eversion. Deceased ROM in DF of ankle joint.  Neurological: Sensation intact to light touch. Protective sensation diminished bilateral.    Assessment:  No diagnosis found.   Plan:  Patient was evaluated and treated and all questions answered. -Discussed and educated patient on diabetic foot care, especially with  regards to the vascular, neurological and musculoskeletal systems.  -Stressed the importance of good glycemic control and the detriment of not  controlling glucose levels in relation to  the foot. -Discussed supportive shoes at all times and checking feet regularly.  -Mechanically debrided all nails 1-5 bilateral using sterile nail nipper and filed with dremel without incident  -Answered all patient questions -Patient to return  in 3 months for at risk foot care -Patient advised to call the office if any problems or questions arise in the meantime.   Louann Sjogren, DPM

## 2022-12-24 ENCOUNTER — Other Ambulatory Visit: Payer: Self-pay | Admitting: Family Medicine

## 2022-12-24 DIAGNOSIS — R928 Other abnormal and inconclusive findings on diagnostic imaging of breast: Secondary | ICD-10-CM

## 2023-01-01 ENCOUNTER — Other Ambulatory Visit: Payer: Self-pay | Admitting: Family Medicine

## 2023-01-01 DIAGNOSIS — R600 Localized edema: Secondary | ICD-10-CM

## 2023-01-02 NOTE — Progress Notes (Deleted)
    SUBJECTIVE:   CHIEF COMPLAINT / HPI:   Hypertension: - Medications: Losartan 100mg  daily? - Compliance: *** - Checking BP at home: *** - Denies any SOB, CP, vision changes, LE edema, medication SEs, or symptoms of hypotension - Diet: *** - Exercise: ***   PERTINENT  PMH / PSH: ***  OBJECTIVE:   There were no vitals taken for this visit. ***  General: NAD, pleasant, able to participate in exam Cardiac: RRR, no murmurs. Respiratory: CTAB, normal effort, No wheezes, rales or rhonchi Abdomen: Bowel sounds present, nontender, nondistended Extremities: no edema or cyanosis. Skin: warm and dry, no rashes noted Neuro: alert, no obvious focal deficits Psych: Normal affect and mood  ASSESSMENT/PLAN:   No problem-specific Assessment & Plan notes found for this encounter.     Dr. Elberta Fortis, DO [place] [facility]    {    This will disappear when note is signed, click to select method of visit    :1}

## 2023-01-05 ENCOUNTER — Other Ambulatory Visit: Payer: Medicare Other

## 2023-01-05 ENCOUNTER — Ambulatory Visit: Payer: Medicare Other | Admitting: Family Medicine

## 2023-01-06 ENCOUNTER — Other Ambulatory Visit: Payer: Medicare Other

## 2023-01-06 ENCOUNTER — Other Ambulatory Visit: Payer: Self-pay | Admitting: Family Medicine

## 2023-01-06 DIAGNOSIS — R600 Localized edema: Secondary | ICD-10-CM

## 2023-01-09 ENCOUNTER — Ambulatory Visit: Payer: Medicare Other | Admitting: Family Medicine

## 2023-01-14 ENCOUNTER — Encounter: Payer: Self-pay | Admitting: Family Medicine

## 2023-01-18 NOTE — Progress Notes (Deleted)
    SUBJECTIVE:   CHIEF COMPLAINT / HPI:   Hypertension: - Medications: Losartan 100mg  daily? - Compliance: *** - Checking BP at home: *** - Denies any SOB, CP, vision changes, LE edema, medication SEs, or symptoms of hypotension - Diet: *** - Exercise: ***   Diabetes Current Regimen: Lantus 44U  CBGs: CGM (Dexcom)  Last A1c:  Lab Results  Component Value Date   HGBA1C 7.7 (A) 08/07/2022    Denies polyuria, polydipsia, hypoglycemia *** Last Eye Exam: *** Statin: Atorvastatin 40mg  daily ACE/ARB: Losartan 100mg   -Due for ACR, BMP, lipid panel in 03/2023   PERTINENT  PMH / PSH: ***  OBJECTIVE:   There were no vitals taken for this visit. ***  General: NAD, pleasant, able to participate in exam Cardiac: RRR, no murmurs. Respiratory: CTAB, normal effort, No wheezes, rales or rhonchi Abdomen: Bowel sounds present, nontender, nondistended Extremities: no edema or cyanosis. Skin: warm and dry, no rashes noted Neuro: alert, no obvious focal deficits Psych: Normal affect and mood  ASSESSMENT/PLAN:   No problem-specific Assessment & Plan notes found for this encounter.     Dr. Elberta Fortis, DO [place] [facility]    {    This will disappear when note is signed, click to select method of visit    :1}

## 2023-01-19 ENCOUNTER — Ambulatory Visit: Payer: Medicare Other | Admitting: Family Medicine

## 2023-01-19 ENCOUNTER — Other Ambulatory Visit: Payer: Medicare Other

## 2023-01-19 DIAGNOSIS — E119 Type 2 diabetes mellitus without complications: Secondary | ICD-10-CM

## 2023-01-20 ENCOUNTER — Ambulatory Visit: Payer: Medicare Other | Admitting: Sports Medicine

## 2023-02-10 ENCOUNTER — Ambulatory Visit (INDEPENDENT_AMBULATORY_CARE_PROVIDER_SITE_OTHER): Payer: Medicare Other | Admitting: Sports Medicine

## 2023-02-10 VITALS — BP 134/60 | Ht 61.0 in | Wt 217.0 lb

## 2023-02-10 DIAGNOSIS — M79642 Pain in left hand: Secondary | ICD-10-CM

## 2023-02-10 DIAGNOSIS — M79641 Pain in right hand: Secondary | ICD-10-CM

## 2023-02-10 MED ORDER — METHYLPREDNISOLONE ACETATE 40 MG/ML IJ SUSP
80.0000 mg | Freq: Once | INTRAMUSCULAR | Status: AC
  Administered 2023-02-10: 80 mg via INTRAMUSCULAR

## 2023-02-10 NOTE — Progress Notes (Signed)
   Subjective:    Patient ID: Katherine Christensen, female    DOB: [birth year], 82 y.o.   MRN: [id number]  HPI chief complaint: Bilateral hand pain, right knee pain  Patient is a very pleasant 82 year old female the presents today with several different complaints.  Main complaint is pain in her hands that is localized primarily around her thumbs.  It is worse with gripping and twisting objects.  She denies any triggering of her thumbs although she has had trigger fingers in the past.  No trauma.  She applies Voltaren gel which is very effective.  She is also complaining of right knee pain.  She has a history of known DJD. Last cortisone injection was in April.   Review of Systems As above    Objective:   Physical Exam  Well-developed, well-nourished.  No acute distress  Examination of her hands show no obvious swelling.  No tenderness to palpation at the [facility] joint.  Negative CMC grind.  No active triggering of any of the digits.  Negative Tinel's.  Right knee shows limited range of motion with advanced varus malalignment.  1+ boggy synovitis.      Assessment & Plan:   Bilateral hand pain-rule out osteoarthritis Right knee pain secondary to DJD  Recommended a simple IM Depo-Medrol injection today.  I would also like to get some x-rays of her hands to evaluate the degree of osteoarthritis present.  Will follow-up with her with those results when available.  I think she should stick with the topical Voltaren for her hands as she has found it to be quite effective and it is safer than an oral anti-inflammatory.  If right knee pain persists then consider repeat cortisone injection.  Of note, patient understands that total knee arthroplasty is the cure for this but she is extremely hesitant about general anesthesia as she has not done well with this on previous operations.  This note was dictated using Dragon naturally speaking software and may contain errors in syntax, spelling, or content which  have not been identified prior to signing this note.

## 2023-02-11 NOTE — Progress Notes (Signed)
    SUBJECTIVE:   CHIEF COMPLAINT / HPI:   Hypertension: - Medications: Prescribed Losartan 100mg  but states she was unable to tolerate it. Has been taking losartan 50 mg daily - Checking BP at home: Occasionally. Does not recall specific numbers.  States she was seen up at sports medicine recently and SBP in 130s - Denies any SOB, CP, vision changes, LE edema, medication SEs, or symptoms of hypotension  Diabetes Patient relates concern about low blood sugars indicating she wakes up in the morning feeling sweaty and lightheaded. States she immediately takes a glucose tablet and feels better. Current Regimen: Lantus 44U, has self titrated down to 30U due to symptoms of hypoglycemia CBGs: CGM (Dexcom) - had 1% lows, 77% in range, average glucose 145 Last A1c:  Lab Results  Component Value Date   HGBA1C 7.0 02/12/2023    Last Eye Exam: DUE - referral placed today Statin: Atorvastatin 40mg  daily ACE/ARB: Losartan 50mg    IBS-D Patient reports persistent diarrhea despite daily use of Imodium. Reports she has follow-up with GI at the end of September. Does have a trip planned to [facility] before that time and requesting something to help with her diarrhea symptoms.  Denies incontinence but states she does have to go urgently.  Associated with gas and abdominal cramping prior to diarrhea. Ongoing chronic issue.  PERTINENT  PMH / PSH: T2DM, HTN, IBS-D  OBJECTIVE:   BP (!) 142/64   Pulse 93   Wt 214 lb (97.1 kg)   SpO2 98%   BMI 40.43 kg/m    General: Alert, no apparent distress, well groomed HEENT: Normocephalic, atraumatic, moist mucus membranes, neck supple Respiratory: Normal respiratory effort GI: Non-distended Ext: Bilateral lymphedema noted. Ambulates with rollator Skin: No rashes, no jaundice Psych: Appropriate mood and affect  ASSESSMENT/PLAN:   Essential hypertension 142/64 upon repeat, suboptimal control. Prescribed Losartan 100mg  at prior visit but unable to  tolerate it due to dizzy at higher dose. Patient requested trial of Losartan 75mg  and monitor symptoms. -Rx for Losartan 50mg  and 25mg  tablet to take daily  Type 2 diabetes mellitus with diabetic neuropathy, unspecified (HCC) A1c 7.0, improved from 7.7 in 07/2022. CGM shows multiple hypoglycemia readings and patient is symptomatic during episodes. Concern patient is took tightly controlled and increased risk of falls, will reduce Lantus dosing and monitor closely for further hypoglycemia. -Reduce to Lantus 30U qAM. Advised patient to monitor CGM and reduce by 2 units each time hypoglycemia event occurs. Voiced understanding of hypoglycemic rescue. -BMP, lipid panel and ACR monitoring -Referral to ophthalmology for diabetic eye exam -F/u in 1 month for CGM check  Peripheral edema Stable on Furosemide 30mg  daily, refill sent.  IBS (irritable bowel syndrome) H/o IBS-D, uncontrolled on Imodium daily as she still reports excess gas and fecal urgency. Upcoming appointment with GI at the end of September to discuss but has a trip scheduled prior to that time and would like something else for symptom relief. -Trial Bentyl with meals and at bedtime -Minimize food triggers that worsen symptoms     Dr. Elberta Fortis, DO [facility]

## 2023-02-12 ENCOUNTER — Ambulatory Visit
Admission: RE | Admit: 2023-02-12 | Discharge: 2023-02-12 | Disposition: A | Discharge status: Home / Self Care | Payer: Medicare Other | Source: Ambulatory Visit | Attending: Family Medicine | Admitting: Family Medicine

## 2023-02-12 ENCOUNTER — Ambulatory Visit (INDEPENDENT_AMBULATORY_CARE_PROVIDER_SITE_OTHER): Payer: Medicare Other | Admitting: Family Medicine

## 2023-02-12 VITALS — BP 142/64 | HR 93 | Wt 214.0 lb

## 2023-02-12 DIAGNOSIS — Z794 Long term (current) use of insulin: Secondary | ICD-10-CM

## 2023-02-12 DIAGNOSIS — K580 Irritable bowel syndrome with diarrhea: Secondary | ICD-10-CM | POA: Diagnosis not present

## 2023-02-12 DIAGNOSIS — R600 Localized edema: Secondary | ICD-10-CM | POA: Diagnosis not present

## 2023-02-12 DIAGNOSIS — E1165 Type 2 diabetes mellitus with hyperglycemia: Secondary | ICD-10-CM | POA: Diagnosis not present

## 2023-02-12 DIAGNOSIS — E1140 Type 2 diabetes mellitus with diabetic neuropathy, unspecified: Secondary | ICD-10-CM | POA: Diagnosis not present

## 2023-02-12 DIAGNOSIS — I10 Essential (primary) hypertension: Secondary | ICD-10-CM | POA: Diagnosis not present

## 2023-02-12 DIAGNOSIS — E119 Type 2 diabetes mellitus without complications: Secondary | ICD-10-CM | POA: Diagnosis present

## 2023-02-12 DIAGNOSIS — R928 Other abnormal and inconclusive findings on diagnostic imaging of breast: Secondary | ICD-10-CM

## 2023-02-12 LAB — POCT GLYCOSYLATED HEMOGLOBIN (HGB A1C): HbA1c, POC (controlled diabetic range): 7 % (ref 0.0–7.0)

## 2023-02-12 MED ORDER — LOSARTAN POTASSIUM 25 MG PO TABS
25.0000 mg | ORAL_TABLET | Freq: Every day | ORAL | 3 refills | Status: AC
Start: 2023-02-12 — End: ?

## 2023-02-12 MED ORDER — INSULIN GLARGINE 100 UNIT/ML SOLOSTAR PEN
30.0000 [IU] | PEN_INJECTOR | SUBCUTANEOUS | 11 refills | Status: AC
Start: 2023-02-12 — End: ?

## 2023-02-12 MED ORDER — FUROSEMIDE 20 MG PO TABS
30.0000 mg | ORAL_TABLET | Freq: Every day | ORAL | 2 refills | Status: DC
Start: 2023-02-12 — End: 2023-02-19

## 2023-02-12 MED ORDER — DICYCLOMINE HCL 10 MG PO CAPS
10.0000 mg | ORAL_CAPSULE | Freq: Three times a day (TID) | ORAL | 1 refills | Status: AC
Start: 2023-02-12 — End: ?

## 2023-02-12 NOTE — Patient Instructions (Signed)
It was wonderful to see you today! Thank you for choosing [facility].   Please bring ALL of your medications with you to every visit.   Today we talked about:  For your blood pressure, try taking Losartan 75mg  daily. It will be the 50mg  and 25mg  pill at the same time. I would recommend checking your blood pressure at home. For your diabetes, I sent in the prescription for brand name Lantus. This may require insurance authorization but we will see. I would recommend using only 30 units every morning and backing down by 2 units if you have a low blood sugar. I referred you to the eye doctor, our office will call about that. We are checking some yearly blood work as well. For the diarrhea, try taking the Bentyl up to 3 times per day for symptom relief. I would recommend continuing follow up with the specialist next month.  Please follow up in 1 month   We are checking some labs today. If they are abnormal, I will call you. If they are normal, I will send you a MyChart message (if it is active) or a letter in the mail. If you do not hear about your labs in the next 2 weeks, please call the office.  Call the clinic at [phone] if your symptoms worsen or you have any concerns.  Please be sure to schedule follow up at the front desk before you leave today.   Elberta Fortis, DO Family Medicine

## 2023-02-13 ENCOUNTER — Encounter: Payer: Self-pay | Admitting: Family Medicine

## 2023-02-13 DIAGNOSIS — K589 Irritable bowel syndrome without diarrhea: Secondary | ICD-10-CM | POA: Insufficient documentation

## 2023-02-13 MED ORDER — LOSARTAN POTASSIUM 50 MG PO TABS
50.0000 mg | ORAL_TABLET | Freq: Every day | ORAL | 3 refills | Status: AC
Start: 2023-02-13 — End: ?

## 2023-02-13 NOTE — Assessment & Plan Note (Signed)
142/64 upon repeat, suboptimal control. Prescribed Losartan 100mg  at prior visit but unable to tolerate it due to dizzy at higher dose. Patient requested trial of Losartan 75mg  and monitor symptoms. -Rx for Losartan 50mg  and 25mg  tablet to take daily

## 2023-02-13 NOTE — Assessment & Plan Note (Addendum)
A1c 7.0, improved from 7.7 in 07/2022. CGM shows multiple hypoglycemia readings and patient is symptomatic during episodes. Concern patient is took tightly controlled and increased risk of falls, will reduce Lantus dosing and monitor closely for further hypoglycemia. -Reduce to Lantus 30U qAM. Advised patient to monitor CGM and reduce by 2 units each time hypoglycemia event occurs. Voiced understanding of hypoglycemic rescue. -BMP, lipid panel and ACR monitoring -Referral to ophthalmology for diabetic eye exam -F/u in 1 month for CGM check

## 2023-02-13 NOTE — Assessment & Plan Note (Signed)
Stable on Furosemide 30mg  daily, refill sent.

## 2023-02-13 NOTE — Assessment & Plan Note (Addendum)
H/o IBS-D, uncontrolled on Imodium daily as she still reports excess gas and fecal urgency. Upcoming appointment with GI at the end of September to discuss but has a trip scheduled prior to that time and would like something else for symptom relief. -Trial Bentyl with meals and at bedtime -Minimize food triggers that worsen symptoms

## 2023-02-19 ENCOUNTER — Other Ambulatory Visit: Payer: Self-pay

## 2023-02-19 DIAGNOSIS — R600 Localized edema: Secondary | ICD-10-CM

## 2023-02-19 LAB — LIPID PANEL
Chol/HDL Ratio: 3.5 ratio (ref 0.0–4.4)
Cholesterol, Total: 139 mg/dL (ref 100–199)
HDL: 40 mg/dL (ref >39)
LDL Chol Calc (NIH): 81 mg/dL (ref 0–99)
Triglycerides: 96 mg/dL (ref 0–149)
VLDL Cholesterol Cal: 18 mg/dL (ref 5–40)

## 2023-02-19 LAB — BASIC METABOLIC PANEL
BUN/Creatinine Ratio: 17 (ref 12–28)
BUN: 9 mg/dL (ref 8–27)
CO2: 24 mmol/L (ref 20–29)
Calcium: 9.7 mg/dL (ref 8.7–10.3)
Chloride: 101 mmol/L (ref 96–106)
Creatinine, Ser: 0.54 mg/dL — ABNORMAL LOW (ref 0.57–1.00)
Glucose: 227 mg/dL — ABNORMAL HIGH (ref 70–99)
Potassium: 4.1 mmol/L (ref 3.5–5.2)
Sodium: 142 mmol/L (ref 134–144)
eGFR: 93 mL/min/{1.73_m2} (ref >59)

## 2023-02-19 LAB — MICROALBUMIN / CREATININE URINE RATIO

## 2023-02-19 NOTE — Telephone Encounter (Addendum)
Patient calls nurse line requesting partial refill on furosemide. She states that she is currently in [place], [place] and forgot to pack her furosemide.   I have pended refill to the requested pharmacy. She will be in [place] until Sunday and will then remain out of town for another week.   If possible, could you send patient a 2 week supply to this pharmacy.   Will forward to PCP.   Veronda Prude, RN

## 2023-02-20 MED ORDER — FUROSEMIDE 20 MG PO TABS
30.0000 mg | ORAL_TABLET | Freq: Every day | ORAL | 0 refills | Status: DC
Start: 2023-02-20 — End: 2023-05-11

## 2023-02-27 ENCOUNTER — Other Ambulatory Visit: Payer: Self-pay | Admitting: Family Medicine

## 2023-02-27 DIAGNOSIS — E1140 Type 2 diabetes mellitus with diabetic neuropathy, unspecified: Secondary | ICD-10-CM

## 2023-03-09 ENCOUNTER — Ambulatory Visit: Payer: Medicare Other | Admitting: Podiatry

## 2023-03-18 ENCOUNTER — Encounter: Payer: Self-pay | Admitting: Pharmacist

## 2023-03-20 NOTE — Progress Notes (Deleted)
    SUBJECTIVE:   CHIEF COMPLAINT / HPI:   Diabetes Current Regimen: Lantus 30U qAM? CBGs: ***  Last A1c:  Lab Results  Component Value Date   HGBA1C 7.0 02/12/2023    Denies polyuria, polydipsia, hypoglycemia *** Last Eye Exam: *** Statin: *** ACE/ARB: ***   Hypertension: - Medications: Losartan 75mg ? - Compliance: *** - Checking BP at home: *** - Denies any SOB, CP, vision changes, LE edema, medication SEs, or symptoms of hypotension - Diet: *** - Exercise: ***   *Flu vaccine  PERTINENT  PMH / PSH: ***  OBJECTIVE:   There were no vitals taken for this visit. ***  General: NAD, pleasant, able to participate in exam Cardiac: RRR, no murmurs. Respiratory: CTAB, normal effort, No wheezes, rales or rhonchi Abdomen: Bowel sounds present, nontender, nondistended Extremities: no edema or cyanosis. Skin: warm and dry, no rashes noted Neuro: alert, no obvious focal deficits Psych: Normal affect and mood  ASSESSMENT/PLAN:   No problem-specific Assessment & Plan notes found for this encounter.     Dr. Elberta Fortis, DO [place] [facility]    {    This will disappear when note is signed, click to select method of visit    :1}

## 2023-03-23 ENCOUNTER — Telehealth: Payer: Self-pay

## 2023-03-23 NOTE — Telephone Encounter (Signed)
Patient LVM on nurse line on Friday regarding being in the hospital. Patient reported that she is still in [place], [place]. She was admitted for two blood clots. She stated in VM that she would call back once she has a phone.   Attempted to call her back on her mobile number. She did not answer, VM box was full.   Patient asked that message be sent to provider.   Veronda Prude, RN

## 2023-03-23 NOTE — Telephone Encounter (Signed)
Attempted to contact patient to follow up on hospitalization. No response and VM box is full. She is scheduled in clinic tomorrow (9/24) but [place] still in [place] per her message. Will forward to the admin to reschedule her.  Elberta Fortis, DO

## 2023-03-24 ENCOUNTER — Ambulatory Visit: Payer: Medicare Other | Admitting: Family Medicine

## 2023-03-24 ENCOUNTER — Ambulatory Visit (INDEPENDENT_AMBULATORY_CARE_PROVIDER_SITE_OTHER): Payer: Medicare Other | Admitting: Podiatry

## 2023-03-24 DIAGNOSIS — Z91199 Patient's noncompliance with other medical treatment and regimen due to unspecified reason: Secondary | ICD-10-CM

## 2023-03-24 NOTE — Progress Notes (Signed)
No show

## 2023-03-24 NOTE — Telephone Encounter (Signed)
Called and patient does not have vm set up. I cancelled appointment.   Thanks [employer]

## 2023-04-01 ENCOUNTER — Ambulatory Visit (INDEPENDENT_AMBULATORY_CARE_PROVIDER_SITE_OTHER): Payer: Medicare Other | Admitting: Podiatry

## 2023-04-01 DIAGNOSIS — Z91199 Patient's noncompliance with other medical treatment and regimen due to unspecified reason: Secondary | ICD-10-CM

## 2023-04-01 NOTE — Progress Notes (Signed)
No show

## 2023-05-10 ENCOUNTER — Other Ambulatory Visit: Payer: Self-pay | Admitting: Family Medicine

## 2023-05-10 DIAGNOSIS — R600 Localized edema: Secondary | ICD-10-CM

## 2023-05-29 ENCOUNTER — Telehealth: Payer: Self-pay

## 2023-05-29 NOTE — Transitions of Care (Post Inpatient/ED Visit) (Signed)
   05/29/2023  Name: Katherine Christensen MRN: [id number] DOB: 1940/07/21  Today's TOC FU Call Status: Today's TOC FU Call Status:: Unsuccessful Call (1st Attempt) Unsuccessful Call (1st Attempt) Date: 05/29/23  Attempted to reach the patient regarding the most recent Inpatient/ED visit.  Follow Up Plan: Additional outreach attempts will be made to reach the patient to complete the Transitions of Care (Post Inpatient/ED visit) call.   Signature  Kandis Fantasia, LPN [facility] Health Advisor [place] [facility] You Are. We Are. One [facility] Direct Dial [phone]
